# Patient Record
Sex: Female | Born: 1955
Health system: Southern US, Academic
[De-identification: ages and names within clinical notes are randomized; demographics above are authoritative.]

## PROBLEM LIST (undated history)

## (undated) ENCOUNTER — Encounter

## (undated) ENCOUNTER — Telehealth

## (undated) DIAGNOSIS — C50911 Malignant neoplasm of unspecified site of right female breast: Secondary | ICD-10-CM

## (undated) HISTORY — DX: Malignant neoplasm of unspecified site of right female breast: C50.911

---

## 1999-07-18 ENCOUNTER — Encounter: Admission: RE | Admit: 1999-07-18 | Discharge: 1999-07-18 | Payer: Self-pay | Admitting: Family Medicine

## 1999-07-18 ENCOUNTER — Encounter: Payer: Self-pay | Admitting: Family Medicine

## 1999-07-27 ENCOUNTER — Other Ambulatory Visit: Admission: RE | Admit: 1999-07-27 | Discharge: 1999-07-27 | Payer: Self-pay | Admitting: Obstetrics & Gynecology

## 2000-05-08 ENCOUNTER — Encounter (INDEPENDENT_AMBULATORY_CARE_PROVIDER_SITE_OTHER): Payer: Self-pay | Admitting: Specialist

## 2000-05-08 ENCOUNTER — Ambulatory Visit (HOSPITAL_COMMUNITY): Admission: RE | Admit: 2000-05-08 | Discharge: 2000-05-08 | Payer: Self-pay | Admitting: *Deleted

## 2000-09-03 ENCOUNTER — Other Ambulatory Visit: Admission: RE | Admit: 2000-09-03 | Discharge: 2000-09-03 | Payer: Self-pay | Admitting: *Deleted

## 2000-12-03 ENCOUNTER — Encounter: Payer: Self-pay | Admitting: Family Medicine

## 2000-12-03 ENCOUNTER — Encounter: Admission: RE | Admit: 2000-12-03 | Discharge: 2000-12-03 | Payer: Self-pay | Admitting: Family Medicine

## 2001-06-19 ENCOUNTER — Encounter: Admission: RE | Admit: 2001-06-19 | Discharge: 2001-06-19 | Payer: Self-pay | Admitting: Family Medicine

## 2001-06-19 ENCOUNTER — Encounter: Payer: Self-pay | Admitting: Family Medicine

## 2001-09-08 ENCOUNTER — Other Ambulatory Visit: Admission: RE | Admit: 2001-09-08 | Discharge: 2001-09-08 | Payer: Self-pay | Admitting: *Deleted

## 2002-05-26 ENCOUNTER — Other Ambulatory Visit: Admission: RE | Admit: 2002-05-26 | Discharge: 2002-05-26 | Payer: Self-pay | Admitting: *Deleted

## 2002-06-30 ENCOUNTER — Encounter (INDEPENDENT_AMBULATORY_CARE_PROVIDER_SITE_OTHER): Payer: Self-pay

## 2002-06-30 ENCOUNTER — Observation Stay (HOSPITAL_COMMUNITY): Admission: RE | Admit: 2002-06-30 | Discharge: 2002-07-01 | Payer: Self-pay | Admitting: Obstetrics and Gynecology

## 2003-06-04 ENCOUNTER — Encounter: Payer: Self-pay | Admitting: Obstetrics and Gynecology

## 2003-06-04 ENCOUNTER — Encounter: Admission: RE | Admit: 2003-06-04 | Discharge: 2003-06-04 | Payer: Self-pay | Admitting: Obstetrics and Gynecology

## 2004-07-17 ENCOUNTER — Encounter: Admission: RE | Admit: 2004-07-17 | Discharge: 2004-07-17 | Payer: Self-pay | Admitting: Family Medicine

## 2005-02-07 ENCOUNTER — Encounter: Admission: RE | Admit: 2005-02-07 | Discharge: 2005-02-07 | Payer: Self-pay | Admitting: *Deleted

## 2005-05-31 ENCOUNTER — Encounter: Admission: RE | Admit: 2005-05-31 | Discharge: 2005-05-31 | Payer: Self-pay | Admitting: Orthopaedic Surgery

## 2005-06-07 ENCOUNTER — Encounter: Admission: RE | Admit: 2005-06-07 | Discharge: 2005-06-07 | Payer: Self-pay | Admitting: *Deleted

## 2005-06-11 ENCOUNTER — Encounter: Admission: RE | Admit: 2005-06-11 | Discharge: 2005-06-11 | Payer: Self-pay | Admitting: Orthopaedic Surgery

## 2006-05-09 ENCOUNTER — Encounter: Admission: RE | Admit: 2006-05-09 | Discharge: 2006-05-09 | Payer: Self-pay | Admitting: Orthopaedic Surgery

## 2006-06-18 ENCOUNTER — Encounter: Admission: RE | Admit: 2006-06-18 | Discharge: 2006-06-18 | Payer: Self-pay | Admitting: Family Medicine

## 2006-06-21 ENCOUNTER — Encounter: Admission: RE | Admit: 2006-06-21 | Discharge: 2006-06-21 | Payer: Self-pay | Admitting: Orthopaedic Surgery

## 2006-10-08 ENCOUNTER — Encounter: Admission: RE | Admit: 2006-10-08 | Discharge: 2006-10-08 | Payer: Self-pay | Admitting: Obstetrics and Gynecology

## 2007-03-19 ENCOUNTER — Encounter: Admission: RE | Admit: 2007-03-19 | Discharge: 2007-03-19 | Payer: Self-pay | Admitting: Orthopaedic Surgery

## 2007-07-01 ENCOUNTER — Encounter: Admission: RE | Admit: 2007-07-01 | Discharge: 2007-07-01 | Payer: Self-pay | Admitting: Obstetrics and Gynecology

## 2007-10-14 ENCOUNTER — Encounter: Admission: RE | Admit: 2007-10-14 | Discharge: 2007-10-14 | Payer: Self-pay | Admitting: Orthopaedic Surgery

## 2008-03-03 ENCOUNTER — Encounter: Admission: RE | Admit: 2008-03-03 | Discharge: 2008-03-03 | Payer: Self-pay | Admitting: Orthopaedic Surgery

## 2008-06-22 ENCOUNTER — Other Ambulatory Visit: Admission: RE | Admit: 2008-06-22 | Discharge: 2008-06-22 | Payer: Self-pay | Admitting: Obstetrics and Gynecology

## 2008-08-17 ENCOUNTER — Encounter: Admission: RE | Admit: 2008-08-17 | Discharge: 2008-08-17 | Payer: Self-pay | Admitting: Obstetrics and Gynecology

## 2008-08-31 ENCOUNTER — Encounter: Admission: RE | Admit: 2008-08-31 | Discharge: 2008-08-31 | Payer: Self-pay | Admitting: Obstetrics and Gynecology

## 2008-08-31 ENCOUNTER — Encounter (INDEPENDENT_AMBULATORY_CARE_PROVIDER_SITE_OTHER): Payer: Self-pay | Admitting: Diagnostic Radiology

## 2008-09-09 ENCOUNTER — Encounter: Admission: RE | Admit: 2008-09-09 | Discharge: 2008-09-09 | Payer: Self-pay | Admitting: Obstetrics and Gynecology

## 2008-09-13 ENCOUNTER — Encounter: Admission: RE | Admit: 2008-09-13 | Discharge: 2008-09-13 | Payer: Self-pay | Admitting: Obstetrics and Gynecology

## 2008-09-16 ENCOUNTER — Encounter (INDEPENDENT_AMBULATORY_CARE_PROVIDER_SITE_OTHER): Payer: Self-pay | Admitting: Diagnostic Radiology

## 2008-09-16 ENCOUNTER — Encounter: Admission: RE | Admit: 2008-09-16 | Discharge: 2008-09-16 | Payer: Self-pay | Admitting: Obstetrics and Gynecology

## 2008-10-08 ENCOUNTER — Encounter: Admission: RE | Admit: 2008-10-08 | Discharge: 2008-10-08 | Payer: Self-pay | Admitting: Obstetrics and Gynecology

## 2008-10-08 ENCOUNTER — Ambulatory Visit (HOSPITAL_COMMUNITY): Admission: RE | Admit: 2008-10-08 | Discharge: 2008-10-08 | Payer: Self-pay | Admitting: Surgery

## 2008-10-08 ENCOUNTER — Encounter (INDEPENDENT_AMBULATORY_CARE_PROVIDER_SITE_OTHER): Payer: Self-pay | Admitting: Surgery

## 2008-10-18 ENCOUNTER — Ambulatory Visit: Admission: RE | Admit: 2008-10-18 | Discharge: 2008-11-25 | Payer: Self-pay | Admitting: Radiation Oncology

## 2008-10-20 ENCOUNTER — Ambulatory Visit: Payer: Self-pay | Admitting: Oncology

## 2008-11-11 ENCOUNTER — Ambulatory Visit (HOSPITAL_COMMUNITY): Admission: RE | Admit: 2008-11-11 | Discharge: 2008-11-12 | Payer: Self-pay | Admitting: Surgery

## 2008-11-11 ENCOUNTER — Encounter (INDEPENDENT_AMBULATORY_CARE_PROVIDER_SITE_OTHER): Payer: Self-pay | Admitting: Surgery

## 2008-11-24 LAB — CBC WITH DIFFERENTIAL/PLATELET
BASO%: 0.4 % (ref 0.0–2.0)
EOS%: 9.5 % — ABNORMAL HIGH (ref 0.0–7.0)
HGB: 14.1 g/dL (ref 11.6–15.9)
MCH: 32.8 pg (ref 25.1–34.0)
MCHC: 34.3 g/dL (ref 31.5–36.0)
RDW: 12.5 % (ref 11.2–14.5)
WBC: 9.8 10*3/uL (ref 3.9–10.3)
lymph#: 2.9 10*3/uL (ref 0.9–3.3)

## 2008-11-25 ENCOUNTER — Ambulatory Visit: Payer: Self-pay

## 2008-11-25 ENCOUNTER — Encounter: Payer: Self-pay | Admitting: Oncology

## 2008-11-25 LAB — COMPREHENSIVE METABOLIC PANEL
ALT: 81 U/L — ABNORMAL HIGH (ref 0–35)
AST: 55 U/L — ABNORMAL HIGH (ref 0–37)
Albumin: 4.4 g/dL (ref 3.5–5.2)
Calcium: 9.7 mg/dL (ref 8.4–10.5)
Chloride: 100 mEq/L (ref 96–112)
Potassium: 4.2 mEq/L (ref 3.5–5.3)
Total Protein: 6.9 g/dL (ref 6.0–8.3)

## 2008-12-07 ENCOUNTER — Encounter: Admission: RE | Admit: 2008-12-07 | Discharge: 2008-12-07 | Payer: Self-pay | Admitting: Orthopedic Surgery

## 2008-12-20 ENCOUNTER — Ambulatory Visit: Payer: Self-pay | Admitting: Oncology

## 2008-12-22 LAB — CBC WITH DIFFERENTIAL/PLATELET
BASO%: 0.5 % (ref 0.0–2.0)
LYMPH%: 42.5 % (ref 14.0–49.7)
MCHC: 34.7 g/dL (ref 31.5–36.0)
MONO#: 0.6 10*3/uL (ref 0.1–0.9)
Platelets: 169 10*3/uL (ref 145–400)
RBC: 4.3 10*6/uL (ref 3.70–5.45)
RDW: 13.2 % (ref 11.2–14.5)
WBC: 9.1 10*3/uL (ref 3.9–10.3)
nRBC: 0 % (ref 0–0)

## 2009-01-12 LAB — CBC WITH DIFFERENTIAL/PLATELET
BASO%: 0.7 % (ref 0.0–2.0)
EOS%: 1.9 % (ref 0.0–7.0)
Eosinophils Absolute: 0.1 10*3/uL (ref 0.0–0.5)
HCT: 36.9 % (ref 34.8–46.6)
HGB: 13.2 g/dL (ref 11.6–15.9)
LYMPH%: 40.1 % (ref 14.0–49.7)
MCHC: 35.8 g/dL (ref 31.5–36.0)
MONO%: 8.5 % (ref 0.0–14.0)
NEUT#: 3.6 10*3/uL (ref 1.5–6.5)
RBC: 3.99 10*6/uL (ref 3.70–5.45)
RDW: 13.6 % (ref 11.2–14.5)
WBC: 7.4 10*3/uL (ref 3.9–10.3)

## 2009-02-21 ENCOUNTER — Encounter: Payer: Self-pay | Admitting: Cardiology

## 2009-02-21 ENCOUNTER — Ambulatory Visit: Payer: Self-pay

## 2009-02-21 ENCOUNTER — Ambulatory Visit: Payer: Self-pay | Admitting: Oncology

## 2009-02-21 ENCOUNTER — Encounter: Payer: Self-pay | Admitting: Oncology

## 2009-02-23 LAB — CBC WITH DIFFERENTIAL/PLATELET
BASO%: 1.3 % (ref 0.0–2.0)
HCT: 35.9 % (ref 34.8–46.6)
MCHC: 34.5 g/dL (ref 31.5–36.0)
MONO#: 0.5 10*3/uL (ref 0.1–0.9)
RBC: 3.79 10*6/uL (ref 3.70–5.45)
RDW: 12.2 % (ref 11.2–14.5)
WBC: 7.1 10*3/uL (ref 3.9–10.3)
lymph#: 2.7 10*3/uL (ref 0.9–3.3)

## 2009-03-16 LAB — CBC WITH DIFFERENTIAL/PLATELET
BASO%: 0.6 % (ref 0.0–2.0)
LYMPH%: 38.8 % (ref 14.0–49.7)
MCHC: 35.3 g/dL (ref 31.5–36.0)
MONO#: 0.7 10*3/uL (ref 0.1–0.9)
MONO%: 9.2 % (ref 0.0–14.0)
Platelets: 160 10*3/uL (ref 145–400)
RBC: 3.53 10*6/uL — ABNORMAL LOW (ref 3.70–5.45)
WBC: 7.2 10*3/uL (ref 3.9–10.3)

## 2009-04-06 ENCOUNTER — Ambulatory Visit: Payer: Self-pay | Admitting: Oncology

## 2009-04-08 LAB — CBC WITH DIFFERENTIAL/PLATELET
BASO%: 0.5 % (ref 0.0–2.0)
EOS%: 0.9 % (ref 0.0–7.0)
MCH: 32.2 pg (ref 25.1–34.0)
MCHC: 34.7 g/dL (ref 31.5–36.0)
RDW: 11.6 % (ref 11.2–14.5)
lymph#: 3.7 10*3/uL — ABNORMAL HIGH (ref 0.9–3.3)

## 2009-04-08 LAB — TSH: TSH: 5.158 u[IU]/mL — ABNORMAL HIGH (ref 0.350–4.500)

## 2009-05-03 ENCOUNTER — Encounter: Admission: RE | Admit: 2009-05-03 | Discharge: 2009-05-03 | Payer: Self-pay | Admitting: Orthopedic Surgery

## 2009-05-23 ENCOUNTER — Ambulatory Visit: Payer: Self-pay | Admitting: Oncology

## 2009-05-24 ENCOUNTER — Ambulatory Visit: Payer: Self-pay

## 2009-05-24 ENCOUNTER — Encounter: Payer: Self-pay | Admitting: Oncology

## 2009-05-25 LAB — CBC WITH DIFFERENTIAL/PLATELET
BASO%: 0.4 % (ref 0.0–2.0)
HCT: 39.3 % (ref 34.8–46.6)
MCHC: 34.9 g/dL (ref 31.5–36.0)
MONO#: 0.5 10*3/uL (ref 0.1–0.9)
NEUT%: 53 % (ref 38.4–76.8)
WBC: 6.7 10*3/uL (ref 3.9–10.3)
lymph#: 2.5 10*3/uL (ref 0.9–3.3)

## 2009-05-25 LAB — COMPREHENSIVE METABOLIC PANEL
ALT: 24 U/L (ref 0–35)
Albumin: 3.9 g/dL (ref 3.5–5.2)
CO2: 31 mEq/L (ref 19–32)
Calcium: 9.3 mg/dL (ref 8.4–10.5)
Chloride: 107 mEq/L (ref 96–112)
Sodium: 141 mEq/L (ref 135–145)
Total Protein: 6.3 g/dL (ref 6.0–8.3)

## 2009-05-25 LAB — TSH: TSH: 2.672 u[IU]/mL (ref 0.350–4.500)

## 2009-06-07 IMAGING — CR DG CHEST 2V
2 series · 2 of 2 positions shown · non-contrast
Comparison: None

CLINICAL DATA: Breast cancer, pre admission, former smoker

CHEST - 2 VIEW

[view not recorded (1 of 2)]
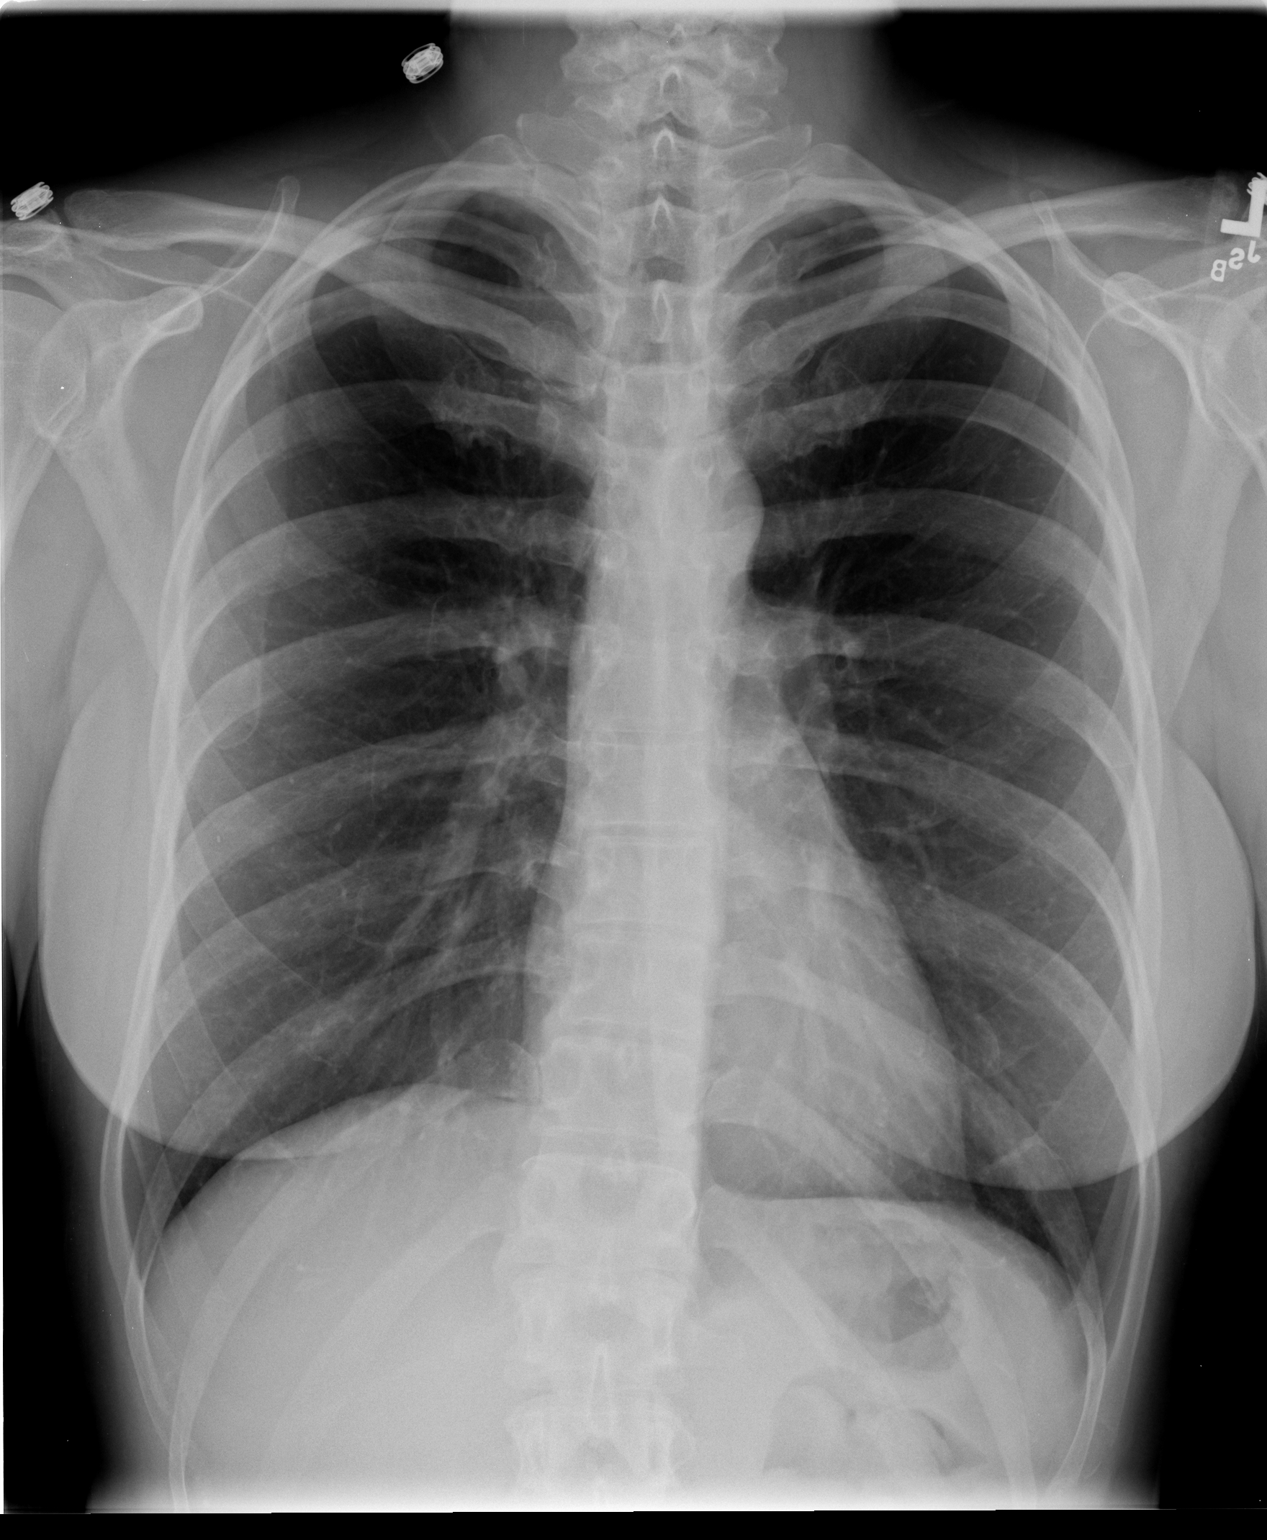

[view not recorded (2 of 2)]
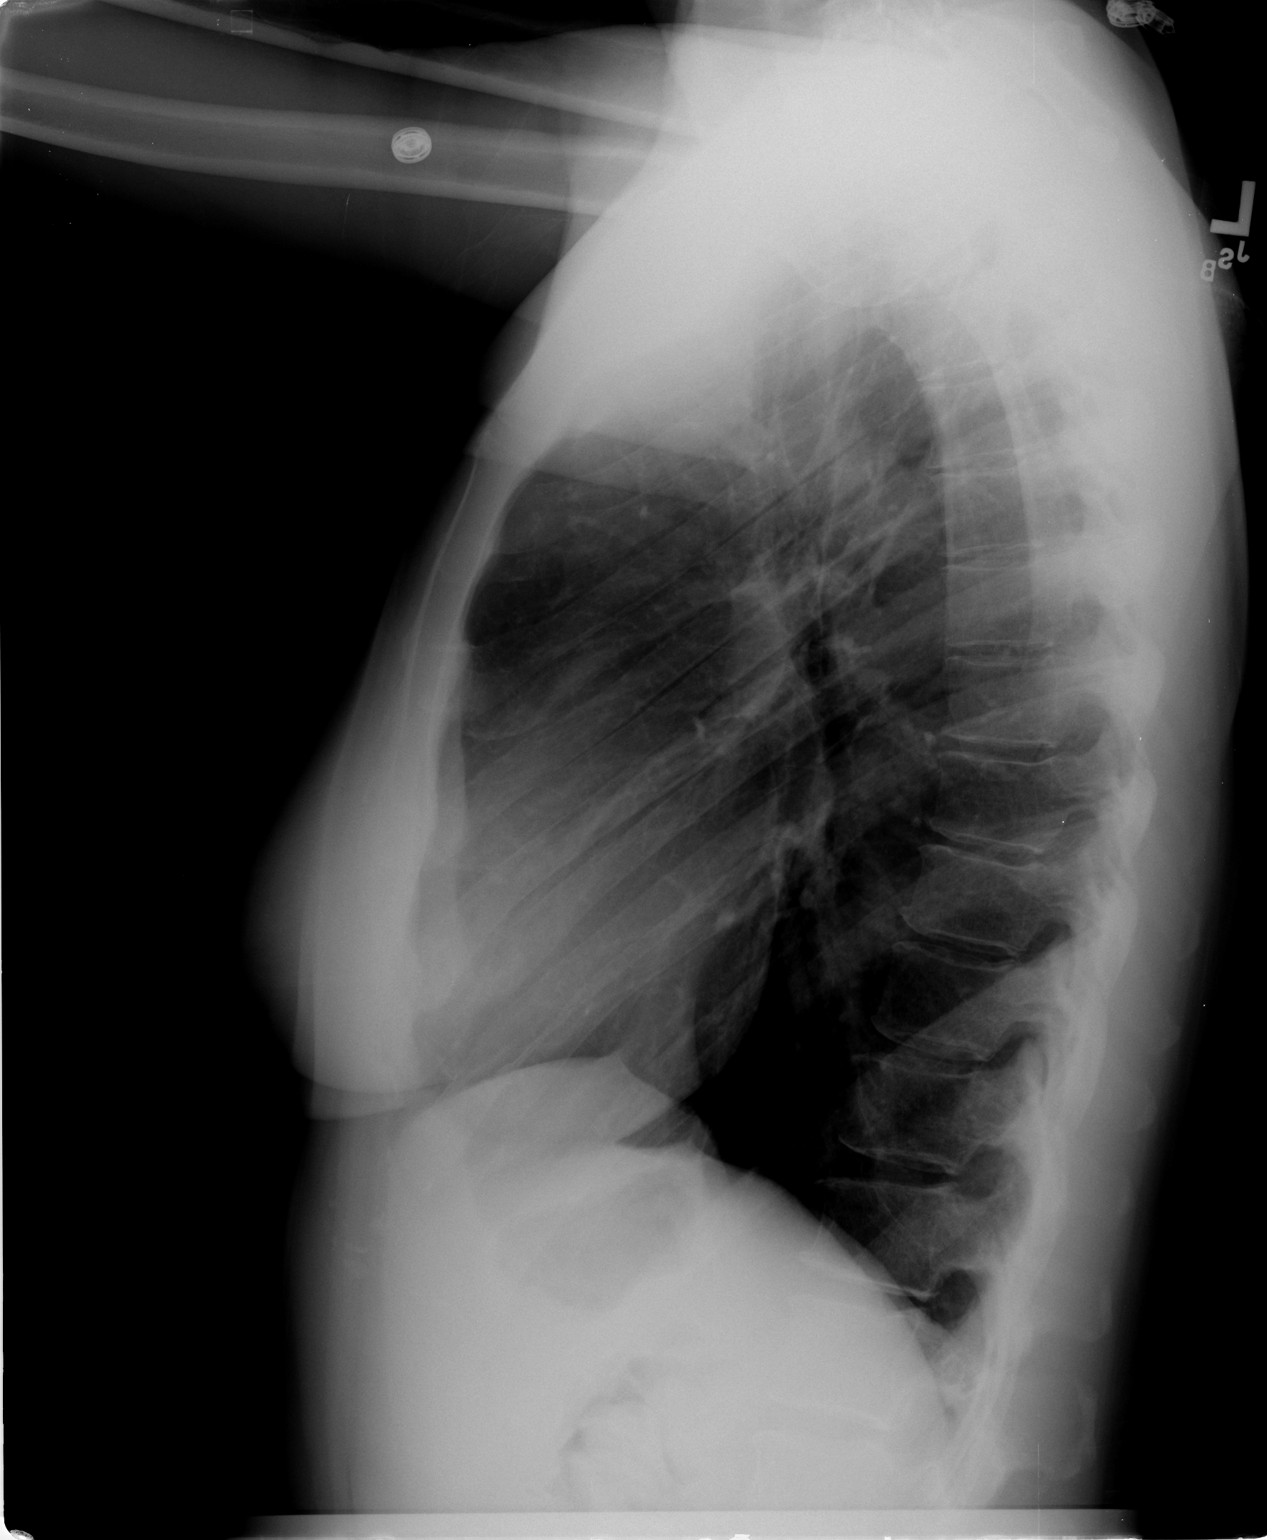

[2 of 2 positions shown; findings below may reference images not displayed]

FINDINGS: Normal heart size, mediastinal contours, and pulmonary vascularity.
Mild peribronchial thickening.
No pulmonary infiltrate, pleural effusion, or pneumothorax.
Bones unremarkable.
No pulmonary mass or nodule.
IMPRESSION: Mild bronchitic changes.

## 2009-06-21 ENCOUNTER — Encounter: Admission: RE | Admit: 2009-06-21 | Discharge: 2009-06-21 | Payer: Self-pay | Admitting: Orthopedic Surgery

## 2009-06-21 LAB — CBC WITH DIFFERENTIAL/PLATELET
BASO%: 0.3 % (ref 0.0–2.0)
Basophils Absolute: 0 10*3/uL (ref 0.0–0.1)
EOS%: 0.2 % (ref 0.0–7.0)
HCT: 36 % (ref 34.8–46.6)
HGB: 12.5 g/dL (ref 11.6–15.9)
MCH: 32.1 pg (ref 25.1–34.0)
MONO#: 0.1 10*3/uL (ref 0.1–0.9)
NEUT%: 80 % — ABNORMAL HIGH (ref 38.4–76.8)
RDW: 12.4 % (ref 11.2–14.5)
WBC: 6.3 10*3/uL (ref 3.9–10.3)
lymph#: 1.1 10*3/uL (ref 0.9–3.3)

## 2009-06-21 LAB — FOLLICLE STIMULATING HORMONE: FSH: 47 m[IU]/mL

## 2009-06-29 LAB — ESTRADIOL, ULTRA SENS

## 2009-07-22 ENCOUNTER — Ambulatory Visit: Payer: Self-pay | Admitting: Oncology

## 2009-07-27 LAB — CBC WITH DIFFERENTIAL/PLATELET
BASO%: 0.3 % (ref 0.0–2.0)
EOS%: 1.6 % (ref 0.0–7.0)
MCH: 32.4 pg (ref 25.1–34.0)
MCHC: 34.2 g/dL (ref 31.5–36.0)
MONO#: 0.7 10*3/uL (ref 0.1–0.9)
RBC: 3.86 10*6/uL (ref 3.70–5.45)
RDW: 12.6 % (ref 11.2–14.5)
WBC: 9.4 10*3/uL (ref 3.9–10.3)
lymph#: 3.5 10*3/uL — ABNORMAL HIGH (ref 0.9–3.3)

## 2009-08-16 ENCOUNTER — Ambulatory Visit (HOSPITAL_COMMUNITY): Admission: RE | Admit: 2009-08-16 | Discharge: 2009-08-16 | Payer: Self-pay | Admitting: Oncology

## 2009-08-16 ENCOUNTER — Ambulatory Visit: Payer: Self-pay

## 2009-08-16 ENCOUNTER — Ambulatory Visit: Payer: Self-pay | Admitting: Cardiology

## 2009-08-16 ENCOUNTER — Encounter: Payer: Self-pay | Admitting: Oncology

## 2009-08-17 ENCOUNTER — Ambulatory Visit: Payer: Self-pay | Admitting: Oncology

## 2009-08-17 LAB — CBC WITH DIFFERENTIAL/PLATELET
BASO%: 0.9 % (ref 0.0–2.0)
EOS%: 3.3 % (ref 0.0–7.0)
LYMPH%: 40.7 % (ref 14.0–49.7)
MCH: 32.1 pg (ref 25.1–34.0)
MCHC: 34.4 g/dL (ref 31.5–36.0)
MCV: 93.3 fL (ref 79.5–101.0)
MONO#: 0.5 10*3/uL (ref 0.1–0.9)
MONO%: 7.2 % (ref 0.0–14.0)
NEUT%: 47.9 % (ref 38.4–76.8)
Platelets: 200 10*3/uL (ref 145–400)
RBC: 4.18 10*6/uL (ref 3.70–5.45)
WBC: 6.6 10*3/uL (ref 3.9–10.3)

## 2009-08-18 ENCOUNTER — Encounter: Admission: RE | Admit: 2009-08-18 | Discharge: 2009-08-18 | Payer: Self-pay | Admitting: Oncology

## 2009-08-29 ENCOUNTER — Ambulatory Visit (HOSPITAL_COMMUNITY): Admission: RE | Admit: 2009-08-29 | Discharge: 2009-08-29 | Payer: Self-pay | Admitting: Oncology

## 2009-09-12 LAB — COMPREHENSIVE METABOLIC PANEL
Alkaline Phosphatase: 64 U/L (ref 39–117)
BUN: 21 mg/dL (ref 6–23)
Creatinine, Ser: 0.61 mg/dL (ref 0.40–1.20)
Glucose, Bld: 77 mg/dL (ref 70–99)
Total Bilirubin: 0.7 mg/dL (ref 0.3–1.2)

## 2009-09-12 LAB — CBC WITH DIFFERENTIAL/PLATELET
Basophils Absolute: 0.1 10*3/uL (ref 0.0–0.1)
Eosinophils Absolute: 0.2 10*3/uL (ref 0.0–0.5)
HGB: 13.6 g/dL (ref 11.6–15.9)
LYMPH%: 41.9 % (ref 14.0–49.7)
MCV: 92.4 fL (ref 79.5–101.0)
MONO%: 8.6 % (ref 0.0–14.0)
NEUT#: 2.8 10*3/uL (ref 1.5–6.5)
Platelets: 178 10*3/uL (ref 145–400)

## 2009-09-29 ENCOUNTER — Ambulatory Visit: Payer: Self-pay | Admitting: Oncology

## 2009-10-05 LAB — CBC WITH DIFFERENTIAL/PLATELET
BASO%: 0.4 % (ref 0.0–2.0)
HCT: 37.7 % (ref 34.8–46.6)
HGB: 13.2 g/dL (ref 11.6–15.9)
MCHC: 35.1 g/dL (ref 31.5–36.0)
MONO#: 0.6 10*3/uL (ref 0.1–0.9)
NEUT%: 56.9 % (ref 38.4–76.8)
RDW: 12.7 % (ref 11.2–14.5)
WBC: 7.4 10*3/uL (ref 3.9–10.3)
lymph#: 2.5 10*3/uL (ref 0.9–3.3)

## 2009-10-21 LAB — CBC WITH DIFFERENTIAL/PLATELET
Basophils Absolute: 0 10*3/uL (ref 0.0–0.1)
Eosinophils Absolute: 0.1 10*3/uL (ref 0.0–0.5)
HCT: 39.9 % (ref 34.8–46.6)
HGB: 13.9 g/dL (ref 11.6–15.9)
MCH: 33.3 pg (ref 25.1–34.0)
MONO#: 0.5 10*3/uL (ref 0.1–0.9)
NEUT#: 3.4 10*3/uL (ref 1.5–6.5)
NEUT%: 51.1 % (ref 38.4–76.8)
lymph#: 2.6 10*3/uL (ref 0.9–3.3)

## 2009-11-16 ENCOUNTER — Ambulatory Visit: Payer: Self-pay | Admitting: Oncology

## 2009-11-18 LAB — CBC WITH DIFFERENTIAL/PLATELET
BASO%: 0.4 % (ref 0.0–2.0)
EOS%: 0.8 % (ref 0.0–7.0)
HCT: 39 % (ref 34.8–46.6)
LYMPH%: 37.9 % (ref 14.0–49.7)
MCH: 32 pg (ref 25.1–34.0)
MCHC: 34.9 g/dL (ref 31.5–36.0)
NEUT%: 53 % (ref 38.4–76.8)
RBC: 4.25 10*6/uL (ref 3.70–5.45)
lymph#: 2.8 10*3/uL (ref 0.9–3.3)
nRBC: 0 % (ref 0–0)

## 2009-12-07 LAB — CBC WITH DIFFERENTIAL/PLATELET
BASO%: 0.4 % (ref 0.0–2.0)
HCT: 38.8 % (ref 34.8–46.6)
LYMPH%: 34.3 % (ref 14.0–49.7)
MCH: 33.2 pg (ref 25.1–34.0)
MCHC: 34.8 g/dL (ref 31.5–36.0)
MCV: 95.4 fL (ref 79.5–101.0)
MONO#: 0.7 10*3/uL (ref 0.1–0.9)
MONO%: 10.3 % (ref 0.0–14.0)
NEUT%: 52.1 % (ref 38.4–76.8)
Platelets: 182 10*3/uL (ref 145–400)
RBC: 4.07 10*6/uL (ref 3.70–5.45)

## 2010-01-02 ENCOUNTER — Ambulatory Visit: Payer: Self-pay | Admitting: Oncology

## 2010-01-04 ENCOUNTER — Ambulatory Visit: Payer: Self-pay | Admitting: Cardiology

## 2010-01-04 ENCOUNTER — Ambulatory Visit: Admission: RE | Admit: 2010-01-04 | Discharge: 2010-01-04 | Payer: Self-pay | Admitting: Oncology

## 2010-01-04 ENCOUNTER — Encounter: Payer: Self-pay | Admitting: Oncology

## 2010-01-04 LAB — COMPREHENSIVE METABOLIC PANEL
ALT: 24 U/L (ref 0–35)
AST: 19 U/L (ref 0–37)
Albumin: 4.7 g/dL (ref 3.5–5.2)
CO2: 22 mEq/L (ref 19–32)
Calcium: 9.4 mg/dL (ref 8.4–10.5)
Chloride: 105 mEq/L (ref 96–112)
Potassium: 4.4 mEq/L (ref 3.5–5.3)
Total Protein: 6.8 g/dL (ref 6.0–8.3)

## 2010-01-04 LAB — CBC WITH DIFFERENTIAL/PLATELET
BASO%: 0.5 % (ref 0.0–2.0)
EOS%: 2.3 % (ref 0.0–7.0)
HCT: 41.7 % (ref 34.8–46.6)
HGB: 14.6 g/dL (ref 11.6–15.9)
MCH: 33.6 pg (ref 25.1–34.0)
MCHC: 35 g/dL (ref 31.5–36.0)
MONO#: 0.8 10*3/uL (ref 0.1–0.9)
NEUT%: 54.2 % (ref 38.4–76.8)
RDW: 12.6 % (ref 11.2–14.5)
WBC: 9.3 10*3/uL (ref 3.9–10.3)
lymph#: 3.2 10*3/uL (ref 0.9–3.3)

## 2010-01-04 LAB — FOLLICLE STIMULATING HORMONE: FSH: 11.7 m[IU]/mL

## 2010-04-14 ENCOUNTER — Ambulatory Visit: Payer: Self-pay | Admitting: Oncology

## 2010-07-04 ENCOUNTER — Ambulatory Visit: Payer: Self-pay | Admitting: Oncology

## 2010-07-13 LAB — CBC WITH DIFFERENTIAL/PLATELET
Basophils Absolute: 0 10*3/uL (ref 0.0–0.1)
EOS%: 1.2 % (ref 0.0–7.0)
Eosinophils Absolute: 0.1 10*3/uL (ref 0.0–0.5)
HCT: 38.7 % (ref 34.8–46.6)
HGB: 13.6 g/dL (ref 11.6–15.9)
MCH: 33.8 pg (ref 25.1–34.0)
MCV: 96.2 fL (ref 79.5–101.0)
MONO%: 7.2 % (ref 0.0–14.0)
NEUT#: 4.2 10*3/uL (ref 1.5–6.5)
NEUT%: 54.7 % (ref 38.4–76.8)
Platelets: 181 10*3/uL (ref 145–400)

## 2010-07-13 LAB — COMPREHENSIVE METABOLIC PANEL
AST: 18 U/L (ref 0–37)
Albumin: 4.6 g/dL (ref 3.5–5.2)
Alkaline Phosphatase: 51 U/L (ref 39–117)
BUN: 16 mg/dL (ref 6–23)
Calcium: 9.5 mg/dL (ref 8.4–10.5)
Creatinine, Ser: 1.06 mg/dL (ref 0.40–1.20)
Glucose, Bld: 82 mg/dL (ref 70–99)

## 2010-07-20 LAB — ESTRADIOL, ULTRA SENS: Estradiol, Ultra Sensitive: 7 pg/mL

## 2010-09-08 ENCOUNTER — Encounter
Admission: RE | Admit: 2010-09-08 | Discharge: 2010-09-08 | Payer: Self-pay | Source: Home / Self Care | Attending: Oncology | Admitting: Oncology

## 2010-10-22 ENCOUNTER — Encounter: Payer: Self-pay | Admitting: Orthopaedic Surgery

## 2010-10-22 ENCOUNTER — Encounter: Payer: Self-pay | Admitting: Obstetrics and Gynecology

## 2011-01-15 LAB — DIFFERENTIAL
Basophils Relative: 1 % (ref 0–1)
Eosinophils Absolute: 0.1 10*3/uL (ref 0.0–0.7)
Neutro Abs: 3.7 10*3/uL (ref 1.7–7.7)
Neutrophils Relative %: 49 % (ref 43–77)

## 2011-01-15 LAB — CEA: CEA: 1.9 ng/mL (ref 0.0–5.0)

## 2011-01-15 LAB — LACTATE DEHYDROGENASE: LDH: 117 U/L (ref 94–250)

## 2011-01-15 LAB — CBC
MCHC: 33.7 g/dL (ref 30.0–36.0)
RBC: 4.09 MIL/uL (ref 3.87–5.11)
RDW: 12.1 % (ref 11.5–15.5)

## 2011-01-16 LAB — COMPREHENSIVE METABOLIC PANEL
ALT: 18 U/L (ref 0–35)
AST: 22 U/L (ref 0–37)
Albumin: 3.8 g/dL (ref 3.5–5.2)
Alkaline Phosphatase: 80 U/L (ref 39–117)
Chloride: 105 mEq/L (ref 96–112)
GFR calc Af Amer: >60 mL/min (ref >60)
Potassium: 4.3 mEq/L (ref 3.5–5.1)
Sodium: 140 mEq/L (ref 135–145)
Total Bilirubin: 0.8 mg/dL (ref 0.3–1.2)

## 2011-01-16 LAB — DIFFERENTIAL
Basophils Absolute: 0 10*3/uL (ref 0.0–0.1)
Eosinophils Relative: 7 % — ABNORMAL HIGH (ref 0–5)
Lymphocytes Relative: 39 % (ref 12–46)
Monocytes Absolute: 0.5 10*3/uL (ref 0.1–1.0)

## 2011-01-16 LAB — CBC
Platelets: 183 10*3/uL (ref 150–400)
WBC: 7.3 10*3/uL (ref 4.0–10.5)

## 2011-02-13 NOTE — Op Note (Signed)
NAME:  Gina Owens, Gina Owens           ACCOUNT NO.:  [id number]   MEDICAL RECORD NO.:  [id number]          PATIENT TYPE:  AMB   LOCATION:  SDS                          FACILITY:  [facility]   PHYSICIAN:  Sandria Bales. Ezzard Standing, M.D.  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   PROCEDURE:  Needle local right breast excision with lumpectomy.   SURGEON:  Sandria Bales. Ezzard Standing, MD   FIRST ASSISTANT:  None.   ANESTHESIA:  General endotracheal with about 30 mL of 0.25% Marcaine.   COMPLICATIONS:  None.   INDICATIONS FOR PROCEDURE:  [name] is a 55 year old white female  patient of Dr. Lupe Carney who sees Dr. Aram Beecham Romine from a GYN  standpoint was found on mammogram to have a cluster of  microcalcifications in the upper quadrant of her right breast.  Core  biopsies revealed a high-grade ductal carcinoma in situ with a focus of  necrosis.  Her MRI suggests a possible wider area of involvement of her  right breast but after lengthy discussions, it was thought best to first  do an excisional biopsy of this area and get a hand on how widespread  her disease is.   The indications and complications of the procedure were explained to the  patient.  Potential complications include, but not limited to, bleeding,  infection, nerve injury and the possibility of requiring more surgery if  she widespread disease.   OPERATIVE NOTE:  The patient presented to the operating room.  I marked  the right breast.  She had a wire coming from the right breast about the  12 o' clock position.  The right breast was prepped with chlorhexidine  and sterilely draped.  A time-out was held identifying the patient and  the procedure.   I then made  an approximate 4 cm incision in the upper outer quadrant of  the right breast.  I excised a block of breast tissue about 3-4 cm in  diameter all the way down to the chest wall.  I then marked the specimen  with a paint kit for 6 point orientation and the wire came out  anteriorly and superiorly of the specimen.   Specimen mammogram confirmed the microcalcifications and the clip were  in the specimen.   I then infiltrated the skin and breast with 30 mL of 0.25% Marcaine.  I  closed the subcutaneous tissues with 3-0 Vicryl suture, the skin with a  5-0 Vicryl suture, painted the wound with tincture of benzoin and Steri-  Strips.  The patient tolerated the procedure well, was transported to  recovery room in good condition.   Sponge and needle count were correct at the end of the case.  She will  be in touch with me in about 5 days for final pathology report.      Sandria Bales. Ezzard Standing, M.D.  Electronically Signed     DHN/MEDQ  D:  10/08/2008  T:  10/08/2008  Job:  [id number]   cc:   L. Lupe Carney, M.D.  Cynthia P. Romine, M.D.  Oliver Hum, MD

## 2011-02-13 NOTE — Op Note (Signed)
NAME:  Gina Owens, FLENER NO.:  [id number]   MEDICAL RECORD NO.:  [id number]          PATIENT TYPE:  OIB   LOCATION:  5122                         FACILITY:  [facility]   PHYSICIAN:  Sandria Bales. Ezzard Standing, M.D.  DATE OF BIRTH:  06-Jun-1956   DATE OF PROCEDURE:  DATE OF DISCHARGE:                               OPERATIVE REPORT   Date of surgery ?   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   POSTOPERATIVE DIAGNOSES:  Widespread ductal carcinoma in situ of right  breast (6.2 cm) with microinvasive ductal carcinoma, negative sentinel  lymph node per Dr. Brain Hilts.   PROCEDURE:  1. Injection of methylene blue in right breast.  2. Right axillary sentinel lymph node biopsy.  3. Right simple mastectomy.  4. Immediate reconstruction per Dr. Etter Sjogren with tissue      expanders.   SURGEON:  Sandria Bales. Ezzard Standing, MD   FIRST ASSISTANT:  None.  Dr. Odis Luster will dictate the reconstruction  portion.   ANESTHESIA:  General endotracheal.   ESTIMATED BLOOD LOSS:  100 mL.   DRAINS LEFT IN:  Will be determined by Dr. Odis Luster.   INDICATIONS FOR PROCEDURE:  Ms. Pagett is a 55 year old white female  who sees Dr. Lupe Carney, her primary care doctor, Dr. Meredeth Ide  from a GYN standpoint, has had a right breast lumpectomy on October 08, 2008, and unfortunately, this showed extensive high-grade ductal  carcinoma measuring 6.2 cm with areas of microinvasive ductal carcinoma.  I thought she was not a good candidate for lumpectomy and would be best  served with a mastectomy.  She has spoken to Dr. Etter Sjogren about  immediate reconstruction.  We will proceed with this at this time.   The indications and risks of mastectomy were explained to the patient.  Risks include bleeding, infection, nerve injury, and recurrence of the  tumor.  We will also plan to do a sentinel lymph node biopsy.  She  understands that  the sentinel lymph node biopsy is positive and she will  need a complete axillary node dissection.   OPERATIVE NOTE:  The patient was placed in the supine position.  Her  breasts were scrubbed and then painted with Betadine solution and  sterilely draped.  I had marked her right side before surgery.  A time-  out was held identifying the surgeon, the patient, and the operation.  I  marked the inframammary folds of each breast.   First, I injected her right breast with a 40% methylene blue, I used  about 2 mL.  She had an injection of 1 mCi of sulfa colloid before the  surgery to help identify a sentinel lymph node in her right axilla.  I  made an incision directly over an area of increased counts foudn with  the Neoprobe and cut down to the sentinel lymph node which was blue and  hot.  The sentinel lymph node measured about 600 counts.  The background  was somewhere between 15 and 20.  This was sent to Dr. Brain Hilts who  was  doing touch preps.  She said the touch prep on this was negative.   I then turned my attention to the breast.  I made a standard  circumareolar incision excising the nipple and the skin a little bit on  both sides.  I did not get through her old scar in trying to preserve  her skin for the tissue expander.   I then developed flaps going superiorly 2 fingerbreadths below the  clavicle, inferiorly to the investing fascia of the rectus abdominis  muscle, medial to the lateral edge of the sternum, laterally to the  latissimus dorsi muscle.   I then elevated the breast off the chest wall.  I tried to include my  old biopsy bed and I did take off the fascia and a little bit of muscle  underneath this bed to try to have a complete reexcision of the prior  biopsy site.   I called Dr. Dierdre Searles and instructed that I did want inspection of the bed of  the biopsy site because the margins had previously been so close.  She  also had a small node, so I put a suture on the  lateral margin of the  breast.  I did mark both the breast specimen and node, they was sort of  closed by.   At this point, Dr. Odis Luster scrubbed in to begin the breast  reconstruction.  The skin flaps looked good.  The area between her prior  biopsy site and the skin looked healthy, and Dr. Odis Luster and I discussed  and we thought we would probably leave this in place and we were then  trying to do a wider excision.   He will dictate the remainder of this operation of reconstruction.      Sandria Bales. Ezzard Standing, M.D.  Electronically Signed     DHN/MEDQ  D:  11/11/2008  T:  11/11/2008  Job:  [id number]   cc:   Etter Sjogren, M.D.  Oliver Hum, MD  L. Lupe Carney, M.D.  Valentino Hue. Magrinat, M.D.  Cynthia P. Romine, M.D.  Lurline Hare, MD

## 2011-02-13 NOTE — Op Note (Signed)
NAME:  JAYCEY, GENS NO.:  [id number]   MEDICAL RECORD NO.:  [id number]          PATIENT TYPE:  OIB   LOCATION:  5122                         FACILITY:  [facility]   PHYSICIAN:  Etter Sjogren, M.D.     DATE OF BIRTH:  27-Nov-1955   DATE OF PROCEDURE:  11/11/2008  DATE OF DISCHARGE:                               OPERATIVE REPORT   PREOPERATIVE DIAGNOSIS:  Right breast cancer.   POSTOPERATIVE DIAGNOSIS:  Right breast cancer.   PROCEDURE PERFORMED:  Right breast reconstruction with a tissue  expander.   SURGEON:  Etter Sjogren, MD   ANESTHESIA:  General.   ESTIMATED BLOOD LOSS:  Minimal.   DRAINS:  Two Blakes.   CLINICAL NOTE:  A 55 year old woman who has right breast cancer and  mastectomy was recommended.  She desired reconstruction and options were  discussed with her in length and she selected tissue expander followed  as a planned stage procedure by eventual placement of an implant.  Then  the procedure, the risks, and possible complications were discussed with  her in great detail and she understood those risks to include but were  not limited to bleeding, infection, anesthesia complications, healing  problems, scarring, pneumonia, pneumothorax, pulmonary embolism, fluid  collections, failure of the expander, failure of the implant, capsular  contracture, displacement of the expander, displacement of the implant,  asymmetry, disappointment, and chronic pain.  She wished to proceed.   DESCRIPTION OF PROCEDURE:  The patient presented to the operating room  and mastectomy with sentinel lymph node biopsy had been completed by Dr.  Ovidio Kin.  The submuscular pocket was then developed below the  pectoralis major muscle extending inferiorly to the limits of the  inframammary crease that had been marked preoperatively to the  dimensions of the planned tissue expander.  Irrigation with saline,  meticulous hemostasis with electrocautery, and antibiotic solution  was  also used as an irrigation.  Blake drains were positioned and brought  out through separate stab wounds inferolaterally and secured with 3-0  Prolene sutures.  The expander was soaked in antibiotic solution and  after thoroughly cleaning gloves, it was prepared.  This was a Allergan  implant 133 MV 500 mL implant, lot number [id number].  All the air was  removed, 100 mL sterile saline placed using a closed filling system,  implants soaked in the antibiotic solution, and again careful check  revealed excellent hemostasis.  The expander was positioned.  Care was  taken to be sure that it was positioned at the inferior aspect of the  submuscular pocket and that there were no folds and creases.  The muscle  closure with 3-0 Vicryl interrupted figure-of-eight sutures taking great  care to avoid damage to underlying tissue expander.  The wounds closed  with 3-0 Monocryl interrupted inverted deep dermal sutures and running 3-  0 Monocryl subcuticular suture.  Steri-Strips, dry sterile  dressing, and a circumferential Ace wrap applied and she was transferred  to the recovery room stable having tolerated the procedure well.   DISPOSITION:  She will be observed overnight here at the hospital.  Etter Sjogren, M.D.  Electronically Signed     Etter Sjogren, M.D.  Electronically Signed    DB/MEDQ  D:  11/11/2008  T:  11/12/2008  Job:  [id number]

## 2011-06-25 ENCOUNTER — Other Ambulatory Visit: Payer: Self-pay | Admitting: Orthopedic Surgery

## 2011-06-25 DIAGNOSIS — M549 Dorsalgia, unspecified: Secondary | ICD-10-CM

## 2011-06-29 ENCOUNTER — Ambulatory Visit
Admission: RE | Admit: 2011-06-29 | Discharge: 2011-06-29 | Disposition: A | Discharge status: Home / Self Care | Payer: BC Managed Care – PPO | Source: Ambulatory Visit | Attending: Orthopedic Surgery | Admitting: Orthopedic Surgery

## 2011-06-29 ENCOUNTER — Other Ambulatory Visit: Payer: Self-pay | Admitting: Orthopedic Surgery

## 2011-06-29 DIAGNOSIS — M549 Dorsalgia, unspecified: Secondary | ICD-10-CM

## 2011-06-29 MED ORDER — IOHEXOL 180 MG/ML  SOLN
1.0000 mL | Freq: Once | INTRAMUSCULAR | Status: AC | PRN
  Administered 2011-06-29: 1 mL via EPIDURAL

## 2011-06-29 MED ORDER — METHYLPREDNISOLONE ACETATE 40 MG/ML INJ SUSP (RADIOLOG
120.0000 mg | Freq: Once | INTRAMUSCULAR | Status: AC
  Administered 2011-06-29: 120 mg via EPIDURAL

## 2011-08-10 ENCOUNTER — Other Ambulatory Visit: Payer: Self-pay | Admitting: Obstetrics and Gynecology

## 2011-08-10 DIAGNOSIS — N644 Mastodynia: Secondary | ICD-10-CM

## 2011-08-27 ENCOUNTER — Ambulatory Visit
Admission: RE | Admit: 2011-08-27 | Discharge: 2011-08-27 | Disposition: A | Discharge status: Home / Self Care | Payer: BC Managed Care – PPO | Source: Ambulatory Visit | Attending: Obstetrics and Gynecology | Admitting: Obstetrics and Gynecology

## 2011-08-27 DIAGNOSIS — N644 Mastodynia: Secondary | ICD-10-CM

## 2011-10-01 ENCOUNTER — Encounter: Payer: Self-pay | Admitting: Physician Assistant

## 2011-10-01 ENCOUNTER — Other Ambulatory Visit: Payer: Self-pay | Admitting: Physician Assistant

## 2011-10-01 DIAGNOSIS — C50911 Malignant neoplasm of unspecified site of right female breast: Secondary | ICD-10-CM

## 2011-10-01 HISTORY — DX: Malignant neoplasm of unspecified site of right female breast: C50.911

## 2011-10-09 ENCOUNTER — Ambulatory Visit (HOSPITAL_BASED_OUTPATIENT_CLINIC_OR_DEPARTMENT_OTHER): Payer: BC Managed Care – PPO | Admitting: Physician Assistant

## 2011-10-09 ENCOUNTER — Other Ambulatory Visit: Payer: Self-pay | Admitting: Oncology

## 2011-10-09 ENCOUNTER — Encounter: Payer: Self-pay | Admitting: Physician Assistant

## 2011-10-09 ENCOUNTER — Other Ambulatory Visit: Payer: BC Managed Care – PPO | Admitting: Lab

## 2011-10-09 VITALS — BP 128/79 | HR 67 | Temp 98.2°F | Ht 67.0 in | Wt 167.3 lb

## 2011-10-09 DIAGNOSIS — C50911 Malignant neoplasm of unspecified site of right female breast: Secondary | ICD-10-CM

## 2011-10-09 DIAGNOSIS — Z853 Personal history of malignant neoplasm of breast: Secondary | ICD-10-CM

## 2011-10-09 LAB — COMPREHENSIVE METABOLIC PANEL
Albumin: 4.4 g/dL (ref 3.5–5.2)
BUN: 19 mg/dL (ref 6–23)
Calcium: 9.5 mg/dL (ref 8.4–10.5)
Chloride: 106 mEq/L (ref 96–112)
Creatinine, Ser: 0.79 mg/dL (ref 0.50–1.10)
Glucose, Bld: 90 mg/dL (ref 70–99)
Potassium: 4 mEq/L (ref 3.5–5.3)

## 2011-10-09 LAB — CBC WITH DIFFERENTIAL/PLATELET
Basophils Absolute: 0 10*3/uL (ref 0.0–0.1)
Eosinophils Absolute: 0.1 10*3/uL (ref 0.0–0.5)
HCT: 37.1 % (ref 34.8–46.6)
HGB: 12.9 g/dL (ref 11.6–15.9)
MCV: 96.1 fL (ref 79.5–101.0)
NEUT#: 4.1 10*3/uL (ref 1.5–6.5)
NEUT%: 52.4 % (ref 38.4–76.8)
RDW: 12.4 % (ref 11.2–14.5)
lymph#: 2.9 10*3/uL (ref 0.9–3.3)

## 2011-10-09 NOTE — Progress Notes (Signed)
Hematology and Oncology Follow Up Visit  Gina Owens [id number] Jun 07, 1956 56 y.o. 10/09/2011    HPI: Gina Owens was feeling fine with no symptoms when she went for a screening mammography in November 2009.  This showed an area of calcifications in the upper outer right breast and she was brought back for diagnostic mammography, December 1.  After appropriate discussion, the patient underwent stereotactic biopsy of this suspicious area on the same day and the pathology ([id number]) showed a high-grade ductal carcinoma in situ with evidence of necrosis and calcifications.  The tumor was estrogen and progesterone receptor positive at 51 and 25% respectively.  With this information, the patient was referred to Dr. Ovidio Kin and bilateral breast MRIs were obtained December 10.  This showed an area of enhancement surrounding the clip as expected, but there was a second area approximately 4 cm from the clip that was also suspicious.  Accordingly, the patient underwent a right breast ultrasound on 12/14.  The abnormality could not be located by ultrasound and on 12/17 she underwent MRI-guided biopsy of the second suspicious area and this showed ([id number]) an area of benign breast parenchyma with no atypia or malignancy.  Accordingly, the patient underwent right lumpectomy on October 08, 2008 and the final pathology ([id number] and [id number]) showed extensive high-grade ductal carcinoma in situ with an area of microinvasion measuring less than one mm.  The area of DCIS measured at least 6.2 cm, several margins were positive for the in situ component.  The prognostic panel showed positivity by CISH for the invasive component with a ratio of 7.27.  With all this information, the patient proceeded after appropriate discussion to a right mastectomy with immediate implant reconstruction on November 11, 2008.  The final pathology there ([id number]) showed the single sentinel lymph node to be negative and a second  lymph node in the axillary tail was also negative.  There were multiple foci of residual high-grade ductal carcinoma, but there was no residual invasive carcinoma.  Margins were negative, but the deep margin in particular was 1 mm (at fascia).  The invasive tumor was estrogen receptor positive at 88% and progesterone receptor positive at 52%, as well as of course HER2 positive.  It had a proliferation fraction of 9%.  With this information, the patient met with Dr. Michell Heinrich and Dr. Michell Heinrich thought that radiation would be advisable because of the deep margin, but perhaps could be avoided if the patient received Herceptin.  Accordingly, the patient received one year of Herceptin, completed in March of 2011. An echocardiogram with completion of trastuzumab showed a well preserved ejection fraction of 55-60%.  The patient was also started on tamoxifen, which was been discontinued due to poor tolerance. She's also tried letrozole and exemestane, but was unable to tolerate any of these medications, primarily secondary to severe hot flashes the affected quality of life. She discontinued tamoxifen in July 2011, followed with observation alone. Continues on Megace, 20 mg nightly for control of hot flashes.  Interim History:   Gina Owens returns today for routine one-year followup of her right breast carcinoma. She continues off tamoxifen, and is followed with observation alone. She is feeling well, currently with no physical complaints. She does worry a little bit about whether or not she should try tamoxifen again, and we spent some time discussing this today. Over half of our 30 minute appointment was spent in counseling and coordination of care.  Gina Owens continues to be very busy working, and travels quite a bit. She has  seen several plastic surgeons regarding her right breast reconstruction and has not made a final decision there. She exercises on a regular basis. She is up-to-date with her health  maintenance, including routine physical and screening colonoscopy.  A detailed review of systems is otherwise noncontributory as noted below.  Review of Systems: Constitutional:  no weight loss, fever, night sweats and feels well Eyes: negative BJY:[id number] Cardiovascular: no chest pain or dyspnea on exertion Respiratory: no cough, shortness of breath, or wheezing Neurological: negative Dermatological: negative Gastrointestinal: no abdominal pain, change in bowel habits, or black or bloody stools Genito-Urinary: no dysuria, trouble voiding, or hematuria Hematological and Lymphatic: negative Breast: negative Musculoskeletal: negative Remaining ROS negative.   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  FAMILY HISTORY:  The patient's mother Aleea Hendry, currently 19, was diagnosed with breast cancer at the age of 45.  She was treated with tamoxifen, which she tolerated well.  The patient's father, 14, is alive with a history of prostate cancer presumably cured.  The patient has two sisters with no history of cancer.  She has no brothers.  GYNECOLOGIC HISTORY:  She is Gx, P0.  She never took hormone replacement after her surgery, so she still has her ovaries in place.  She did take birth control pills remotely with no problems.  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  She is unmarried who has a boyfriend who is frequently at home with her, but they do not officially live together.  She is not a church attender although she describes herself a spiritual.  Medications:   I have reviewed the patient's current medications.  Current Outpatient Prescriptions  Medication Sig Dispense Refill  . ALPRAZolam (XANAX) 0.25 MG  tablet Take 0.25 mg by mouth at bedtime as needed.        Marland Kitchen levothyroxine (SYNTHROID, LEVOTHROID) 50 MCG tablet Take 50 mcg by mouth daily.        . megestrol (MEGACE) 20 MG tablet Take 20 mg by mouth at bedtime.        . Multiple Vitamin (MULTIVITAMIN) capsule Take 1 capsule by mouth daily.        . naproxen sodium (ANAPROX) 220 MG tablet Take 220 mg by mouth 2 (two) times daily with a meal.          Allergies:  Allergies  Allergen Reactions  . Venlafaxine Hcl Nausea And Vomiting    Physical Exam: Filed Vitals:   10/09/11 1357  BP: 128/79  Pulse: 67  Temp: 98.2 F (36.8 C)   HEENT:  Sclerae anicteric, conjunctivae pink.  Oropharynx clear.  No mucositis or candidiasis.   Nodes:  No cervical, supraclavicular, or axillary lymphadenopathy palpated.  Breast Exam:  Right breast, status post mastectomy with implant reconstruction. No suspicious nodularity, skin changes, or evidence of local recurrence in the chest wall. Left breast is benign, no masses, discharge, skin change, or nipple inversion.   Lungs:  Clear to auscultation bilaterally.  No crackles, rhonchi, or wheezes.   Heart:  Regular rate and rhythm.   Abdomen:  Soft, nontender.  Positive bowel sounds.  No organomegaly or masses palpated.   Musculoskeletal:  No focal spinal tenderness to palpation.  Extremities:  Benign.  No peripheral edema or cyanosis.   Skin:  Benign.   Neuro:  Nonfocal.   Lab Results: Lab Results  Component Value Date   WBC 7.8 10/09/2011   HGB 12.9 10/09/2011   HCT 37.1 10/09/2011   MCV 96.1 10/09/2011   PLT 161 10/09/2011   NEUTROABS 4.1 10/09/2011     Chemistry      Component Value Date/Time   NA 143 07/13/2010 1252   K 4.3 07/13/2010 1252   CL 108 07/13/2010 1252   CO2 27 07/13/2010 1252   BUN 16 07/13/2010 1252   CREATININE 1.06 07/13/2010 1252      Component Value Date/Time   CALCIUM 9.5 07/13/2010 1252   ALKPHOS 51 07/13/2010 1252   AST 18 07/13/2010 1252   ALT 16 07/13/2010 1252   BILITOT  0.7 07/13/2010 1252        Radiological Studies:  08/27/2011 DIGITAL DIAGNOSTIC LEFT MAMMOGRAM WITH CAD  Comparison: 09/08/2010 and prior studies dating back to  06/18/2006.  Findings: Routine and implant displaced views of the left breast  demonstrate scattered fibroglandular densities.  There is no evidence of mass, distortion or suspicious  calcifications.  A subpectoral implant is again noted.  Mammographic images were processed with CAD.  IMPRESSION:  No specific mammographic evidence of left breast malignancy.  Subpectoral implant.  These findings and causes of breast pain were discussed with the  patient and her questions answered. She was encouraged to  begin/continue monthly self exams and to contact her primary  physician if any changes noted.  BI-RADS CATEGORY 1: Negative.  Recommend left screening mammogram in 1 year.  Original Report Authenticated By: Rosendo Gros, M.D.   Assessment:  This is a 56 year old [place] woman,  1. Status post right mastectomy and sentinel lymph node sampling February of 2010 for a microinvasive ductal carcinoma (1 mm), node negative, grade 1, triple positive, with an MIB-1 of 9%.  Margins were wide except posteriorly which was at the fascia and she is status post right implant placement.    2.  She received a total of one year of Herceptin (completed March of 2011) with an echocardiogram after the completion of Herceptin showing a well preserved ejection fraction at 55% to 60%.    3.  She has been on tamoxifen, Femara, Aromasin and more tamoxifen, but unable to tolerate it.  She is on Megace 20 mg nightly for control of hot flashes.   Plan:  This case was reviewed with Dr. Darnelle Catalan once again today. He is still very comfortable with Gina Owens not being on anti-estrogen therapy at this time. She would really prefer not to begin another medication at this time, and would like to continue to be followed annually with observation  alone.  If she changed her mind, our next plan of action would be to try anastrozole which she has not been on in the past. We would need to repeat her hormone levels, specifically an LH and estradiol level, to ensure sure she is postmenopausal. She status post hysterectomy, but still has both ovaries intact.  Otherwise, Nyajah will be due for her left mammogram in November of 2013, and return to see Dr. Darnelle Catalan approximately one year for routine followup.  This plan was reviewed with the patient, who voices understanding and agreement.  She knows to call with any changes or problems.    Stafford Riviera, PA-C 10/09/2011

## 2012-02-20 ENCOUNTER — Other Ambulatory Visit: Payer: Self-pay | Admitting: *Deleted

## 2012-02-20 DIAGNOSIS — C50911 Malignant neoplasm of unspecified site of right female breast: Secondary | ICD-10-CM

## 2012-02-20 MED ORDER — MEGESTROL ACETATE 20 MG PO TABS: 20.0000 mg | ORAL_TABLET | Freq: Every day | ORAL | Status: AC

## 2012-02-20 NOTE — Telephone Encounter (Signed)
Pt called to this RN to state need for refill on Megace . She states per pharmacy request it was denied.  Of note pt is currently residing in [place] and would need prescription called to her local [facility] at [phone].  This RN reviewed pt's chart and verified prescription with AB/PA.  Refill called to her pharmacy.

## 2012-08-29 ENCOUNTER — Ambulatory Visit: Payer: BC Managed Care – PPO

## 2012-09-02 ENCOUNTER — Other Ambulatory Visit: Payer: Self-pay | Admitting: Oncology

## 2012-09-03 ENCOUNTER — Telehealth: Payer: Self-pay | Admitting: Oncology

## 2012-09-03 NOTE — Telephone Encounter (Signed)
lmonvm advising the pt that her dec 5th appts had to be cancelled and r/s to jan due to a change in the md's schedule

## 2012-09-04 ENCOUNTER — Other Ambulatory Visit: Payer: BC Managed Care – PPO | Admitting: Lab

## 2012-09-04 ENCOUNTER — Ambulatory Visit: Payer: BC Managed Care – PPO | Admitting: Oncology

## 2012-09-09 ENCOUNTER — Other Ambulatory Visit: Payer: BC Managed Care – PPO | Admitting: Lab

## 2012-09-16 ENCOUNTER — Ambulatory Visit: Payer: BC Managed Care – PPO | Admitting: Oncology

## 2012-10-27 ENCOUNTER — Other Ambulatory Visit: Payer: Self-pay | Admitting: *Deleted

## 2012-10-28 ENCOUNTER — Other Ambulatory Visit: Payer: BC Managed Care – PPO | Admitting: Lab

## 2012-10-28 ENCOUNTER — Ambulatory Visit: Payer: BC Managed Care – PPO | Admitting: Oncology

## 2013-05-18 ENCOUNTER — Other Ambulatory Visit: Payer: Self-pay | Admitting: Oncology

## 2013-05-18 DIAGNOSIS — C50919 Malignant neoplasm of unspecified site of unspecified female breast: Secondary | ICD-10-CM

## 2013-07-02 ENCOUNTER — Other Ambulatory Visit: Payer: Self-pay | Admitting: Obstetrics & Gynecology

## 2013-07-03 NOTE — Telephone Encounter (Signed)
eScribe request for refill on LEXAPRO Last filled - 04/01/12, #90 x 2 Last AEX - 08/20/11 Next AEX - not scheduled Pt has not been seen in office since 11/2011. Please advise refills.  Paper chart on your desk.

## 2013-07-07 NOTE — Telephone Encounter (Signed)
Routed to Dr. Silva. 

## 2013-07-08 NOTE — Telephone Encounter (Signed)
Prescription declined.   Patient will need to be seen to have any refills.

## 2013-07-08 NOTE — Telephone Encounter (Signed)
Called patient to try and schedule she is living in [place] where a Doctor prescribes her Lexapro there.

## 2017-02-13 ENCOUNTER — Other Ambulatory Visit: Payer: Self-pay | Admitting: Oncology

## 2017-02-18 ENCOUNTER — Telehealth: Payer: Self-pay | Admitting: Oncology

## 2017-02-18 DIAGNOSIS — M1990 Unspecified osteoarthritis, unspecified site: Secondary | ICD-10-CM

## 2017-02-18 DIAGNOSIS — R634 Abnormal weight loss: Secondary | ICD-10-CM

## 2017-02-18 DIAGNOSIS — K769 Liver disease, unspecified: Secondary | ICD-10-CM

## 2017-02-18 DIAGNOSIS — F419 Anxiety disorder, unspecified: Secondary | ICD-10-CM

## 2017-02-18 DIAGNOSIS — D219 Benign neoplasm of connective and other soft tissue, unspecified: Secondary | ICD-10-CM

## 2017-02-18 DIAGNOSIS — R9389 Abnormal findings on diagnostic imaging of other specified body structures: Secondary | ICD-10-CM

## 2017-02-18 DIAGNOSIS — E039 Hypothyroidism, unspecified: Secondary | ICD-10-CM

## 2017-02-18 DIAGNOSIS — K219 Gastro-esophageal reflux disease without esophagitis: Secondary | ICD-10-CM

## 2017-02-18 DIAGNOSIS — R1310 Dysphagia, unspecified: Secondary | ICD-10-CM

## 2017-02-18 DIAGNOSIS — C169 Malignant neoplasm of stomach, unspecified: Secondary | ICD-10-CM

## 2017-02-18 DIAGNOSIS — C50911 Malignant neoplasm of unspecified site of right female breast: Secondary | ICD-10-CM

## 2017-02-18 DIAGNOSIS — R1013 Epigastric pain: Secondary | ICD-10-CM

## 2017-02-18 NOTE — Progress Notes
Appointment Date: Future Appointments  Date Time Provider [place]   02/19/2017 9:00 AM Gracy Bruins, MD [facility]       PCP: Chauncey Reading    Chief Complaint: NPA: GASTRIC ADENO CARCINOMA    Allergies: Allergies not on file        Radiology: @10 :14am spk/with Ms. Mcmeans in regards to her 02/19/17 appt with Dr. Freddrick March.  Please bring in imaging /reports.  Ms. Neiss has PET imaging.      Pharmacy: No Pharmacies Listed    Checked By: Derrel Nip, MA    Date: 02/18/2017

## 2017-02-18 NOTE — Telephone Encounter (Signed)
Faxed pt records to [facility] [place]

## 2017-02-19 ENCOUNTER — Ambulatory Visit: Attending: Surgery | Primary: Internal Medicine

## 2017-02-19 ENCOUNTER — Ambulatory Visit: Admit: 2017-02-19 | Discharge: 2017-02-19 | Attending: Hematology & Oncology | Primary: Internal Medicine

## 2017-02-19 ENCOUNTER — Encounter: Attending: Hematology & Oncology | Primary: Internal Medicine

## 2017-02-19 DIAGNOSIS — R9389 Abnormal findings on diagnostic imaging of other specified body structures: Secondary | ICD-10-CM

## 2017-02-19 DIAGNOSIS — C50911 Malignant neoplasm of unspecified site of right female breast: Secondary | ICD-10-CM

## 2017-02-19 DIAGNOSIS — C169 Malignant neoplasm of stomach, unspecified: Secondary | ICD-10-CM

## 2017-02-19 DIAGNOSIS — K769 Liver disease, unspecified: Secondary | ICD-10-CM

## 2017-02-19 DIAGNOSIS — Z79899 Other long term (current) drug therapy: Secondary | ICD-10-CM

## 2017-02-19 DIAGNOSIS — M1990 Unspecified osteoarthritis, unspecified site: Secondary | ICD-10-CM

## 2017-02-19 DIAGNOSIS — K219 Gastro-esophageal reflux disease without esophagitis: Secondary | ICD-10-CM

## 2017-02-19 DIAGNOSIS — Z800 Family history of malignant neoplasm of digestive organs: Secondary | ICD-10-CM

## 2017-02-19 DIAGNOSIS — R634 Abnormal weight loss: Secondary | ICD-10-CM

## 2017-02-19 DIAGNOSIS — M47819 Spondylosis without myelopathy or radiculopathy, site unspecified: Secondary | ICD-10-CM

## 2017-02-19 DIAGNOSIS — Z803 Family history of malignant neoplasm of breast: Secondary | ICD-10-CM

## 2017-02-19 DIAGNOSIS — R590 Localized enlarged lymph nodes: Secondary | ICD-10-CM

## 2017-02-19 DIAGNOSIS — E039 Hypothyroidism, unspecified: Secondary | ICD-10-CM

## 2017-02-19 DIAGNOSIS — R1310 Dysphagia, unspecified: Secondary | ICD-10-CM

## 2017-02-19 DIAGNOSIS — Z8042 Family history of malignant neoplasm of prostate: Secondary | ICD-10-CM

## 2017-02-19 DIAGNOSIS — R1013 Epigastric pain: Secondary | ICD-10-CM

## 2017-02-19 DIAGNOSIS — D219 Benign neoplasm of connective and other soft tissue, unspecified: Secondary | ICD-10-CM

## 2017-02-19 DIAGNOSIS — M545 Low back pain: Secondary | ICD-10-CM

## 2017-02-19 DIAGNOSIS — C160 Malignant neoplasm of cardia: Secondary | ICD-10-CM

## 2017-02-19 DIAGNOSIS — Z79891 Long term (current) use of opiate analgesic: Secondary | ICD-10-CM

## 2017-02-19 DIAGNOSIS — F419 Anxiety disorder, unspecified: Secondary | ICD-10-CM

## 2017-02-19 DIAGNOSIS — M810 Age-related osteoporosis without current pathological fracture: Secondary | ICD-10-CM

## 2017-02-19 DIAGNOSIS — Z87891 Personal history of nicotine dependence: Secondary | ICD-10-CM

## 2017-02-19 DIAGNOSIS — G8929 Other chronic pain: Secondary | ICD-10-CM

## 2017-02-19 MED ORDER — DEXTROSE 5 % IV SOLN
Freq: Once | INTRAVENOUS | Status: CN
Start: 2017-02-19 — End: ?

## 2017-02-19 MED ORDER — ESZOPICLONE 3 MG PO TABS
2 refills
Start: 2017-02-19 — End: ?

## 2017-02-19 MED ORDER — OXALIPLATIN INFUSION BCN JX
85 mg/m2 | Freq: Once | INTRAVENOUS | Status: CN
Start: 2017-02-19 — End: ?

## 2017-02-19 MED ORDER — BIMATOPROST 0.03 % EX SOLN
11 refills
Start: 2017-02-19 — End: 2017-04-10

## 2017-02-19 MED ORDER — MEPERIDINE HCL 25 MG/ML IJ SOLN
25 mg | INTRAVENOUS | Status: CN | PRN
Start: 2017-02-19 — End: ?

## 2017-02-19 MED ORDER — HEPARIN SODIUM LOCK FLUSH 100 UNIT/ML IV SOLN
500 [IU] | Status: CN | PRN
Start: 2017-02-19 — End: ?

## 2017-02-19 MED ORDER — DICLOFENAC SODIUM 1 % TD GEL
0 refills
Start: 2017-02-19 — End: ?

## 2017-02-19 MED ORDER — ARMOUR THYROID 15 MG PO TABS
3 refills
Start: 2017-02-19 — End: ?

## 2017-02-19 MED ORDER — SODIUM CHLORIDE FLUSH 0.9 % IV SOLN
10 mL | Status: CN | PRN
Start: 2017-02-19 — End: ?

## 2017-02-19 MED ORDER — ALPRAZOLAM 0.5 MG PO TABS
2 refills
Start: 2017-02-19 — End: ?

## 2017-02-19 MED ORDER — DEXAMETHASONE SODIUM PHOSPHATE 20 MG/5ML IJ SOLN
20 mg | INTRAVENOUS | Status: CN | PRN
Start: 2017-02-19 — End: ?

## 2017-02-19 MED ORDER — TIZANIDINE HCL 4 MG PO TABS
0 refills
Start: 2017-02-19 — End: 2017-04-10

## 2017-02-19 MED ORDER — LEUCOVORIN IVPB JX
400 mg/m2 | Freq: Once | INTRAVENOUS | Status: CN
Start: 2017-02-19 — End: ?

## 2017-02-19 MED ORDER — FLUOROURACIL IV SYRINGE BCN JX
400 mg/m2 | Freq: Once | INTRAVENOUS | Status: CN
Start: 2017-02-19 — End: ?

## 2017-02-19 MED ORDER — FLUOROURACIL INFUSION AMB PUMP BCN JX
2400 mg/m2 | Freq: Once | INTRAVENOUS | Status: CN
Start: 2017-02-19 — End: ?

## 2017-02-19 MED ORDER — ONDANSETRON IVPB BCN JX
16 mg | Freq: Once | INTRAVENOUS | Status: CN
Start: 2017-02-19 — End: ?

## 2017-02-19 MED ORDER — TRAMADOL HCL 50 MG PO TABS
0 refills
Start: 2017-02-19 — End: 2017-04-10

## 2017-02-19 MED ORDER — CONNECT MEDICATION INFUSION JX
1 | Freq: Once | Status: CN
Start: 2017-02-19 — End: ?

## 2017-02-19 MED ORDER — HYDROCORTISONE NA SUCCINATE PF 100 MG IJ SOLR
100 mg | INTRAVENOUS | Status: CN | PRN
Start: 2017-02-19 — End: ?

## 2017-02-19 MED ORDER — OMEPRAZOLE 40 MG PO CPDR
5 refills
Start: 2017-02-19 — End: ?

## 2017-02-19 MED ORDER — ONDANSETRON 8 MG PO TBDP
8 mg | Freq: Three times a day (TID) | ORAL | 11 refills | Status: CP | PRN
Start: 2017-02-19 — End: 2017-09-16

## 2017-02-19 MED ORDER — BUPROPION HCL ER (XL) 300 MG PO TB24
2 refills
Start: 2017-02-19 — End: 2017-04-10

## 2017-02-19 MED ORDER — SUPREP BOWEL PREP KIT 17.5-3.13-1.6 GM/177ML PO SOLN
PACK | 0 refills
Start: 2017-02-19 — End: 2017-03-04

## 2017-02-19 MED ORDER — CARAFATE 1 GM/10ML PO SUSP
2 refills
Start: 2017-02-19 — End: 2017-10-21

## 2017-02-19 MED ORDER — DIPHENHYDRAMINE HCL 50 MG/ML IJ SOLN
25 mg | INTRAVENOUS | Status: CN | PRN
Start: 2017-02-19 — End: ?

## 2017-02-19 MED ORDER — DEXAMETHASONE IVPB BCN JX
20 mg | Freq: Once | INTRAVENOUS | Status: CN
Start: 2017-02-19 — End: ?

## 2017-02-19 MED ORDER — DICLOFENAC SODIUM ER 100 MG PO TB24
2 refills
Start: 2017-02-19 — End: 2017-04-10

## 2017-02-19 MED ORDER — DISCONNECT MEDICATION INFUSION JX
1 | Freq: Once | Status: CN
Start: 2017-02-19 — End: ?

## 2017-02-19 MED ORDER — PROCHLORPERAZINE MALEATE 10 MG PO TABS
10 mg | Freq: Four times a day (QID) | ORAL | 11 refills | Status: CP | PRN
Start: 2017-02-19 — End: ?

## 2017-02-19 NOTE — Progress Notes
Subjective:     I have gastric cancer       HPI                                 Past Medical History:   Diagnosis Date   ? Abnormal CT scan 02/05/2017    CT C/A/P w/ IV contrast   ? Anxiety disorder    ? Arthritis    ? 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 Chronic back pain    ? DJD (degenerative joint disease)     spine   ? Dysphagia    ? Epigastric pain     worsens with eating   ? Fibroids     uterine; s/p hysterectomy   ? 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 GERD (gastroesophageal reflux disease)    ? Hypothyroidism    ? Liver lesion, left lobe 02/04/2017   ? Weight loss, abnormal      Past Surgical History:   Procedure Laterality Date   ? BREAST RECONSTRUCTION  2016   ? CATARACT EXTRACTION W/ INTRAOCULAR LENS IMPLANT     ? COLONOSCOPY W/ BIOPSY  01/28/2017   ? EGD W/ BIOPSY  01/28/2017   ? EUS UPPER W/ FNA  02/08/2017   ? HYSTERECTOMY  2003    uterine fibroids   ? LASIK     ? MASTECTOMY  2011     Family History   Problem Relation Age of Onset   ? Breast Cancer Mother    ? Prostate Cancer Father    ? Colon Cancer Father      Social History     Social History   ? Marital status: Single     Spouse name: N/A   ? Number of children: 0   ? Years of education: N/A     Occupational History   ? Not on file.     Social History Main Topics   ? Smoking status: Former Smoker     Packs/day: 1.00     Years: 15.00     Types: Cigarettes     Start date: 06/24/1974     Quit date: 11/29/1988   ? Smokeless tobacco: Never Used   ? Alcohol use Yes   ? Drug use: Unknown   ? Sexual activity: Not on file     Other Topics Concern   ? Not on file     Social History Narrative   ? No narrative on file     No current outpatient prescriptions on file prior to visit.     No current facility-administered medications on file prior to visit.      No Known Allergies      Review of Systems  Review of Systems

## 2017-02-19 NOTE — Progress Notes
ICD-10-CM ICD-9-CM    1. Gastric adenocarcinoma C16.9 151.9           Plan:     I had along discussion with the patient regarding diagnosis and treatment options.     I recommend upfront chemotherapy and reevaluate afterwards.    Needs Port A cath.    Follow up in 2 months        Surgical Risk:     Risk Assessment

## 2017-02-19 NOTE — Progress Notes
heart beat and No parasternal heave  Breast:N/A  Chest:Symmetrical, No kyphosis and No scoliosis  Respiratory:Clear, No rales/Rhonchi and Percussion and palpation-Normal  Gastrointestinal:Abdomen soft, Abdomen non-tender, Abdomen non-distended, Abdomen without masses, No ascites, No hepatosplenomegaly and No hernia  Hematologic/Lymphatic:No petechiae, No purpura, No neck lymphadenopathy, No Axillary lymphadenopathy and No Groin lymphadenopathy  Musculoskeletal:Normal gait and station, Range of motion normal and Strength/Tone normal  Extremities:No edema, No cyanosis, No digital clubbing and No discoloration  Skin:Negative  Neurologic:Alert and oriented, Alerted orientation/alertness, No hemiplegia, No Hemiparesis, Cranial nerves intact, Paralysis cranial nerves, No sensory deficit and No motor deficits  Rectal Exam:Deferred  Genitourinary:Deferred    Assessment:    [name] with 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 The patient has a Mediport placed as of this Friday, 02/22/2017. I discussed today with her patient and her sister regarding her diagnosis, prognosis and treatments for this type of tumor. I explained in great detail the effects and side effects of chemotherapy especially neuropathy with oxaliplatin possible chest pain or discomfort with 5-FU, less of alopecia and no major other side effects. In general this tablet treatment is relatively well tolerated without excessive cytopenias. I prescribed antinausea medications in case of chemotherapy associated nausea and vomiting, to her local pharmacy. We did add toward with the patient and the sister of our chemotherapy unit and introduce her to our chemotherapy nurses. I have prescribed chemotherapy to start tentatively on Wednesday, Feb 27, 2017.

## 2017-02-19 NOTE — Progress Notes
George Hugh, MD  [address], [place] [address]    Chief Complaint   Patient presents with   ? New Patient   The patient has been referred to this clinic for workup and evaluation and treatment of adenocarcinoma of the GE junction.    Patient Name: Mariah Cameron    Date: 02/19/2017    Date Of Birth: [birth year]    History of Present Illness:    Ms. 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 The patient started having severe acid reflux syndrome in November 2017 and she ended up having an endoscopy on 01/28/2017 with also endoscopic ultrasound on 02/08/2017. Results of the pathology by biopsy of a fungating mass were discussed as above. According to the patient's report the tumor was HER-2 negative. She was initially evaluated by medical oncologist at [facility] [place] but she preferred to transfer her treatment to this facility and also to the [facility] and she has seen Dr. Freddrick March also as of yesterday, who recommended an MRI of the liver as well as neoadjuvant chemotherapy. I do not have results of her PET/CT scan but the patient told me verbally that this was negative for liver lesions, initially described at a recent CT scan of the upper and lower abdomen. There was no other distant metastasis but there was lymphadenopathy associated with the stomach cancer. The patient is scheduled to undergo a Mediport placement by Dr. Dara Lords as of Friday 02/22/2017. She was suggested to have FOLFOX chemotherapy as soon as possible.    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 She had

## 2017-02-19 NOTE — Progress Notes
Neurological    The patient does not have dizziness, headaches, tingling, tremors, sensory changes, speech change, focal weakness, seizures, recent loss of consciousness.    Psychiatry    Patient did not complain of depression, suicidal ideation, substance abuse, hallucinations, nervousness/anxiety, insomnia, memory loss.        Medications:   Current Outpatient Prescriptions:   ?  ALPRAZolam (XANAX) 0.5 MG PO Tablet, TK 1 T PO QHS, Disp: , Rfl: 2  ?  ARMOUR THYROID 15 MG PO Tablet, TK 3 TS PO ONCE D OES, Disp: , Rfl: 3  ?  bimatoprost (LATISSE) 0.03 % EX Solution, , Disp: , Rfl: 11  ?  buPROPion HCl (WELLBUTRIN XL) 300 MG PO Tablet Extended Release 24 Hour, TK 1 T   PO IN THE MORNING ONCE D, Disp: , Rfl: 2  ?  CARAFATE 1 GM/10ML PO Suspension, TK 10 ML PO TID OES 1 HOUR B MEALS AND HS, Disp: , Rfl: 2  ?  diclofenac (VOLTAREN GEL) 1 % TD Gel, APP AA BID UTD, Disp: , Rfl: 0  ?  Diclofenac Sodium CR 100 MG PO Tablet Extended Release 24 Hour, TK 1 T PO ONCE A DAY, Disp: , Rfl: 2  ?  eszopiclone (LUNESTA) 3 MG PO Tablet, , Disp: , Rfl: 2  ?  omeprazole (PriLOSEC) 40 MG PO Capsule Delayed Release, TK 1 C PO QD AC, Disp: , Rfl: 5  ?  SUPREP BOWEL PREP KIT 17.5-3.13-1.6 GM/180ML PO Solution, , Disp: , Rfl: 0  ?  tiZANidine (ZANAFLEX) 4 MG PO Tablet, TK 1 T PO QHS PRN, Disp: , Rfl: 0  ?  traMADol (ULTRAM) 50 MG PO Tablet, TK 1 T PO Q 6 H PRF PAIN, Disp: , Rfl: 0      Labs:   No results found for any previous visit.         Imaging Results:       Physical Exam:   General: Well developed, well nourished, No acute Distress, Chronically ill-looking, Lady patient and Appears stated age  Head:Atraumatic and normocephalic  Eyes:PERRLA, EOMS intact, No jaundice, pallor and Conjunctiva clear  Ears, Nose, Throat, Neck, and Mouth: Trachea Midline, No JVD, No Lymphadenopathy, Thyroid midline-normal, Neck supple, No oral lesions and Tracheostomy not present  Cardiovascular:S1,S2 no murmurs, No rubs or clicks, No bruits, Regular

## 2017-02-19 NOTE — Progress Notes
-  CC at 02/19/17 0843 No    -YK at 02/19/17 1342        User Key  (r) = Recorded By, (t) = Taken By, (c) = Cosigned By    Initials Name Effective Dates    Burns, [place], [place] 01/03/16 -     Starr Sinclair, Edmonia Caprio, MA 12/07/16 -         Physical Exam   Constitutional: She is oriented to person, place, and time. She appears well-developed and well-nourished. No distress.   HENT:   Head: Normocephalic and atraumatic.   Right Ear: External ear normal.   Left Ear: External ear normal.   Nose: Nose normal.   Mouth/Throat: Oropharynx is clear and moist.   Eyes: Conjunctivae and EOM are normal. Pupils are equal, round, and reactive to light. Right eye exhibits no discharge. Left eye exhibits no discharge. No scleral icterus.   Neck: Normal range of motion. Neck supple. No JVD present. No tracheal deviation present. No thyromegaly present.   Cardiovascular: Normal rate, regular rhythm, normal heart sounds and intact distal pulses.  Exam reveals no gallop and no friction rub.    No murmur heard.  Pulmonary/Chest: Effort normal and breath sounds normal. No stridor. No respiratory distress. She has no wheezes. She has no rales. She exhibits no tenderness.   Abdominal: Soft. Bowel sounds are normal. She exhibits no distension and no mass. There is no tenderness. There is no rebound and no guarding. No hernia.   Musculoskeletal: Normal range of motion. She exhibits no edema, tenderness or deformity.   Lymphadenopathy:     She has no cervical adenopathy.   Neurological: She is alert and oriented to person, place, and time. She displays normal reflexes. No cranial nerve deficit or sensory deficit. She exhibits normal muscle tone. Coordination normal.   Skin: Skin is warm. No rash noted. She is not diaphoretic. No erythema. No pallor.   Psychiatric: She has a normal mood and affect. Her behavior is normal. Judgment and thought content normal.     Physical Examination chaperoned by Bethena Roys      Assessment:

## 2017-02-19 NOTE — Progress Notes
Types: Cigarettes     Start date: 06/24/1974     Quit date: 11/29/1988   ? Smokeless tobacco: Never Used   ? Alcohol use Yes   ? Drug use: Unknown   ? Sexual activity: Not on file     Other Topics Concern   ? Not on file     Social History Narrative   ? No narrative on file       Family History:    Family History   Problem Relation Age of Onset   ? Breast Cancer Mother    ? Prostate Cancer Father    ? Colon Cancer Father        Vital Signs:   Vitals:    02/19/17 1342   Weight: 58.5 kg (129 lb)   Height: 1.702 m (5\' 7" )       Allergies: Patient has no known allergies.    Review of Systems:    This was performed today on 02/19/2917 and showed:      Constitutional    The patient does not report any fevers, chills, she has weight loss of 20# since 08/2016, she has malaise/fatigue, no diaphoresis, no major or lateralized weakness.    Skin    There is no systemic rash.  There is no pruritus.    HEENT    There is no hearing loss.  No tinnitus.  There is no ear pain, ear drainage, epistaxis, congestion, sinus pain, stridor, sore throat.    Eyes    There is no blurred vision, double vision, photophobia, eye pain, eye discharge, eye redness.    Cardiovascular    There is no chest pain, palpitations, orthopnea, claudication, leg swelling, PND.    Respiratory     There is no cough, hemoptysis, sputum production, shortness of breath, wheezing.    GI    There is history of heartburn since 08/2016, no nausea, vomiting, there is abdominal pain, post prandial, sharp and occ dull, 5-6/10 lasting for hours post eating, epigastric, fullness feeling, no diarrhea, constipation, no blood in the stool,no melena.    GU     There is no dysuria, urgency, frequency, hematuria, flank pain.    Musculoskeletal    There is no myalgia, neck pain, back pain, joint pain, falls.    Endocrine    There is no polyuria or polydipsia, intolerance to that heat or cold.    Hematology    There is no easy bruising or bleeding.  There is no epistaxis.

## 2017-02-19 NOTE — Progress Notes
Counseling/Coordination of Care and Prescription renewals    Return to Clinic:  in 2 weeks.      This document was created using voice recognition software. Inadvertent typographical errors, word omissions and/or word substitutions may exist.

## 2017-02-19 NOTE — Progress Notes
Constitutional: Positive for activity change, appetite change, fatigue and unexpected weight change.   HENT: Negative.    Eyes: Negative.    Respiratory: Negative.    Cardiovascular: Negative.    Gastrointestinal: Negative.    Endocrine: Negative.    Genitourinary: Negative.    Musculoskeletal: Negative.    Allergic/Immunologic: Negative.    Neurological: Negative.    Hematological: Negative.    Psychiatric/Behavioral: Negative.          Objective:        VITAL SIGNS (all recorded)      [place] Name 02/19/17 0840 02/19/17 1342             Amb Encounter Vitals    Weight 58.5 kg (129 lb)    -CC at 02/19/17 0843 58.5 kg (129 lb)    -YK at 02/19/17 1342      Height 1.702 m (5\' 7" )    -CC at 02/19/17 0843 1.702 m (5\' 7" )    -YK at 02/19/17 1342      BMI (Calculated) 20.25    -CC at 02/19/17 0843 20.25    -YK at 02/19/17 1342      BSA (Calculated - sq m) 1.66    -CC at 02/19/17 0843 1.66    -YK at 02/19/17 1342      BP 101/66    -CC at 02/19/17 0843  ?      BP Location Left upper arm    -CC at 02/19/17 0843  ?      Position Sitting    -CC at 02/19/17 0843 Sitting    -YK at 02/19/17 1342      Pulse 68    -CC at 02/19/17 0843  ?      Pulse Source Monitor    -CC at 02/19/17 0843 Apical    -YK at 02/19/17 1342      Pulse Quality  ? Normal    -YK at 02/19/17 1342      Resp 18    -CC at 02/19/17 0843  ?      Respiration Quality  ? Normal    -YK at 02/19/17 1342      Temp 36.4 ?C (97.5 ?F)    -CC at 02/19/17 0843  ?      Temperature Source  ? Tympanic    -YK at 02/19/17 1342      O2 Saturation 97 %    -CC at 02/19/17 0843  ?      Pain Score Zero    -CC at 02/19/17 0843 Zero    -YK at 02/19/17 1342         Education/Communication Barriers?    Learning/Communication Barriers? No    -CC at 02/19/17 0843 No    -YK at 02/19/17 1342         Fall Risk Assessment    Had recent fall / Last 6 months? No recent fall    -CC at 02/19/17 0843 No recent fall    -YK at 02/19/17 1342      Does patient have a fear of falling? No

## 2017-02-19 NOTE — Progress Notes
a wellness check in March 2018 and this was completely negative. The patient also has history of breast cancer in 2010 and she is status post right mastectomy with breast reconstruction. She did not receive chemotherapy but she was treated with Herceptin for 50 months by an oncologist in [place]. According to her report she could not tolerate tamoxifen. In 2016 she had an episode of vertigo and CT scan of the head showed dural thickness.    The patient has never noticed blood in the stool or in the urine, she never had the yellow jaundice, she never had hepatitis or blood transfusions. She has lost approximately 20 pounds since December 2017. She has difficulties with nutrition especially with solid food, as she has to cut the food in small pieces and chew for long time and she always gets pain postprandially.    The patient used to be a smoker for 10 years but she quit in 1989 and she smoked approximately one pack per day. She used to drink wine.    Regarding her family history her father has dementia and heart disease. Her mother had breast cancer at the age of 21 her great-grandmother also had breast cancer. There is no ovarian cancer in the family. Her father has history of prostate cancer and colon cancer. Her paternal grandmother had also colon cancer.    Past Surgical History:   Past Surgical History:   Procedure Laterality Date   ? BREAST RECONSTRUCTION  2016   ? CATARACT EXTRACTION W/ INTRAOCULAR LENS IMPLANT     ? COLONOSCOPY W/ BIOPSY  01/28/2017   ? EGD W/ BIOPSY  01/28/2017   ? EUS UPPER W/ FNA  02/08/2017   ? HYSTERECTOMY  2003    uterine fibroids   ? LASIK     ? MASTECTOMY  2011       Social History:    Social History     Social History   ? Marital status: Single     Spouse name: N/A   ? Number of children: 0   ? Years of education: N/A     Occupational History   ? Not on file.     Social History Main Topics   ? Smoking status: Former Smoker     Packs/day: 1.00     Years: 15.00

## 2017-02-19 NOTE — Progress Notes
Given her heavy family history of breast cancer, colon cancer, prostate cancer, I have requested a genetic evaluation testing by myriad laboratories. At [facility] testing was requested and results need to be followed for molecular biology of the tumor to include MSI status or other clinically relevant molecular markers.    The patient will return to this clinic for follow-up, most probable with Dr. Lurena Joiner, in 2 weeks after her first round of chemotherapy to assess for toxicity and possible side effects.    The patient and her sister had a long list of questions regarding diagnosis, treatment, sequencing of multi modality treatments, side effects of chemotherapy and this questions were answered to the best of my knowledge during this initial evaluation.    It is of note is that the knee CT scan of the chest abdomen and pelvis from Feb 05, 2017 to indeterminate subcentimeter low-density lesions were noted within the lateral segment of the left hepatic lobe. These were biopsied by fine-needle aspiration at [facility] and were negative for malignancy. Most recent PET CT scan at Dr. Dario Guardian facility did not confirm liver metastasis.    With today's evaluation also I have requested baseline laboratory testing to include a CBC, CMP, LDH, CEA.    Recommendation/Plan:   Return to clinic, Test ordered, Chemo ordered FOLFOX, Review/Obtain old records, Diagnosis and Care Plan discussed, Reviewed NCCN Guidelines , Prognostic/predictive markers reviewed, Treatment Goal-Disease Free Survival, Literature review, Risk/Treatment Benefit, Stable/Treatment ordered, Chemo Education, Side effect reviewed, Bone involvement/Bisphosphonates, Reviewed contacting office//after hours contact, Chemotherapy plan reviewed, Restaging Plan, Alternative Options discussed, Supportive Care Measures discussed, Dehydration/IV Fluids, Radiology Review, Mediport placement, All questions answered,

## 2017-02-21 NOTE — Progress Notes
Feb 19, 2017    INDICATION FOR ENCOUNTER:  Gastric cancer.    I had the pleasure of seeing Mariah Cameron in my office today.  She is a 61 year old Caucasian female with a longstanding history of gastroesophageal reflux disease.  She was started on omeprazole and ranitidine with minimal improvement.  She denies any nausea or vomiting.  She had lost approximately 20 pounds because of poor dietary intake.      Upper endoscopy done on January 28, 2017, showed localized mucosal changes and congestion with erythema in the gastric fundus and gastric body.  Biopsies were consistent with a moderately to poorly differentiated adenocarcinoma.    CT scan of the abdomen, pelvis and chest on Feb 05, 2017, showed thickening of the gastric wall of the cardia with a polypoid lesion protruding into the gastric lumen suggesting malignancy.  There were two indeterminate subcentimeter low-density lesions within the left lateral segment of the liver, which are too small to characterize.    She underwent an endoscopic ultrasound on Feb 08, 2017, showing 2 metastatic lesions in the left lobe of the liver, which were FNA'd.  Biopsies were negative for malignancy.  There were many malignant-appearing lymph nodes in the splenic region, celiac region, perigastric and peripancreatic region.  A large infiltrating mass extending from the GE junction to the lesser curvature of the stomach was found in the cardia, fundus and lesser curvature of the stomach.  She was staged by ultrasound as T3 N3 M1.      PET scan done on Feb 14, 2017, showed focal increased uptake in the anterior wall of the stomach and the fundus with an SUV of 5 and in the gastrohepatic lymph nodes.      ECOG is 0.      I reviewed all the medical record, CT scan and PET scan images.      I had a long discussion with [name] regarding my recommendation including chemotherapy, either 5-FU with oxaliplatin versus ECF per [facility]

## 2017-02-21 NOTE — Progress Notes
trial.  I also recommended sending samples for MSI PD-L1 Foundation testing.      Apparently, Mariah Cameron is seeking to travel to [facility] for [place] and I left it up to her whether she wants to pursue that route or not.      I strongly encouraged her to pay particular attention to the dietary requirement, nutrition, and if needed, we can place a laparoscopic jejunostomy tube to maintain adequate caloric intake.      I spent 60 minutes discussing her case and treatment options and the main emphasis was on the potential metastatic 2 liver lesions that were seen on the CT scan as well as the endoscopic ultrasound, and while the FNA was negative, I feel very suspicious that they could be positive.  I recommend obtaining MRI of the liver for better definition of the lesions.  Having said that, I do not think the liver lesions would change the treatment plan I suggested.      I will follow up with Mariah Cameron in my office in 2 months' time.    Thank you for the referral and the opportunity to participate in the care of this pleasant lady.    Sincerely yours,      Gracy Bruins, MD    ZA/NTS  DD:  02/19/2017 15:28:27  DT:  02/19/2017 16:03:19  Job#:  [id number]  Conf#:  [id number]

## 2017-02-21 NOTE — Progress Notes
Feb 19, 2017    INDICATION FOR ENCOUNTER:  Gastric cancer.    I had the pleasure of seeing Mariah Cameron in my office today.  She is a 61 year old Caucasian female with a longstanding history of gastroesophageal reflux disease.  She was started on omeprazole and ranitidine with minimal improvement.  She denies any nausea or vomiting.  She had lost approximately 20 pounds because of poor dietary intake.      Upper endoscopy done on January 28, 2017, showed localized mucosal changes and congestion with erythema in the gastric fundus and gastric body.  Biopsies were consistent with a moderately to poorly differentiated adenocarcinoma.    CT scan of the abdomen, pelvis and chest on Feb 05, 2017, showed thickening of the gastric wall of the cardia with a polypoid lesion protruding into the gastric lumen suggesting malignancy.  There were two indeterminate subcentimeter low-density lesions within the left lateral segment of the liver, which are too small to characterize.    She underwent an endoscopic ultrasound on Feb 08, 2017, showing  2 metastatic lesions in the left lobe of the liver, which were FNA'd.  Biopsies were negative for malignancy.  There were many malignant-appearing lymph nodes in the splenic region, celiac region, perigastric and peripancreatic region.  A large infiltrating mass extending from the GE junction to the lesser curvature of the stomach was found in the cardia, fundus and lesser curvature of the stomach.  She was staged by ultrasound as T3 N3 M1.      PET scan done on Feb 14, 2017, showed focal increased uptake in the anterior wall of the stomach and the fundus with an SUV of 5 and in the gastrohepatic lymph nodes.      ACOG is 0.      I reviewed all the medical record, CT scan and PET scan images.      I had a long discussion with Mariah Cameron regarding my recommendation including chemotherapy, either 5-FU with oxaliplatin versus ECF per [facility]

## 2017-02-21 NOTE — Progress Notes
trial.  I also recommended sending samples for MSI PD-L1 Foundation testing.      Apparently, Mariah Cameron is seeking to travel to [facility] for [place] and I left it up to her whether she wants to pursue that route or not.      I strongly encouraged her to pay particular attention to the dietary requirement, nutrition, and if needed, we can place a laparoscopic jejunostomy tube to maintain adequate caloric intake.      I spent 60 minutes discussing her case and treatment options and the main emphasis was on the potential metastatic 2 liver lesions that were seen on the CT scan as well as the endoscopic ultrasound, and while the FNA was negative, I feel very suspicious that they could be positive.  I recommend obtaining MRI of the liver for better definition of the lesions.  Having said that, I do not think the liver lesions would change the treatment plan I suggested.      I will follow up with Mariah Cameron in my office in 2 months' time.    Thank you for the referral and the opportunity to participate in the care of this pleasant lady.    Sincerely yours,      Gracy Bruins, MD, FACS    ZA/NTS  DD:  02/19/2017 15:28:27  DT:  02/19/2017 16:03:19  Job#:  [id number]  Conf#:  [id number]

## 2017-02-26 ENCOUNTER — Inpatient Hospital Stay: Admit: 2017-02-26 | Discharge: 2017-02-27 | Primary: Internal Medicine

## 2017-02-26 DIAGNOSIS — Z538 Procedure and treatment not carried out for other reasons: Principal | ICD-10-CM

## 2017-02-26 DIAGNOSIS — C169 Malignant neoplasm of stomach, unspecified: Secondary | ICD-10-CM

## 2017-02-28 ENCOUNTER — Encounter: Attending: Surgery | Primary: Internal Medicine

## 2017-03-04 ENCOUNTER — Ambulatory Visit: Admit: 2017-03-04 | Discharge: 2017-03-05 | Attending: Hematology & Oncology | Primary: Internal Medicine

## 2017-03-04 ENCOUNTER — Inpatient Hospital Stay: Admit: 2017-03-04 | Discharge: 2017-03-05 | Primary: Internal Medicine

## 2017-03-04 DIAGNOSIS — K769 Liver disease, unspecified: Secondary | ICD-10-CM

## 2017-03-04 DIAGNOSIS — Z9011 Acquired absence of right breast and nipple: Secondary | ICD-10-CM

## 2017-03-04 DIAGNOSIS — Z5111 Encounter for antineoplastic chemotherapy: Secondary | ICD-10-CM

## 2017-03-04 DIAGNOSIS — E039 Hypothyroidism, unspecified: Secondary | ICD-10-CM

## 2017-03-04 DIAGNOSIS — R9389 Abnormal findings on diagnostic imaging of other specified body structures: Secondary | ICD-10-CM

## 2017-03-04 DIAGNOSIS — M1990 Unspecified osteoarthritis, unspecified site: Secondary | ICD-10-CM

## 2017-03-04 DIAGNOSIS — Z90710 Acquired absence of both cervix and uterus: Secondary | ICD-10-CM

## 2017-03-04 DIAGNOSIS — R634 Abnormal weight loss: Secondary | ICD-10-CM

## 2017-03-04 DIAGNOSIS — Z79891 Long term (current) use of opiate analgesic: Secondary | ICD-10-CM

## 2017-03-04 DIAGNOSIS — C169 Malignant neoplasm of stomach, unspecified: Principal | ICD-10-CM

## 2017-03-04 DIAGNOSIS — Z87891 Personal history of nicotine dependence: Secondary | ICD-10-CM

## 2017-03-04 DIAGNOSIS — C160 Malignant neoplasm of cardia: Secondary | ICD-10-CM

## 2017-03-04 DIAGNOSIS — Z853 Personal history of malignant neoplasm of breast: Secondary | ICD-10-CM

## 2017-03-04 DIAGNOSIS — K219 Gastro-esophageal reflux disease without esophagitis: Principal | ICD-10-CM

## 2017-03-04 DIAGNOSIS — R1310 Dysphagia, unspecified: Secondary | ICD-10-CM

## 2017-03-04 DIAGNOSIS — F419 Anxiety disorder, unspecified: Secondary | ICD-10-CM

## 2017-03-04 DIAGNOSIS — C50911 Malignant neoplasm of unspecified site of right female breast: Secondary | ICD-10-CM

## 2017-03-04 DIAGNOSIS — R1013 Epigastric pain: Secondary | ICD-10-CM

## 2017-03-04 DIAGNOSIS — Z79899 Other long term (current) drug therapy: Secondary | ICD-10-CM

## 2017-03-04 DIAGNOSIS — D219 Benign neoplasm of connective and other soft tissue, unspecified: Secondary | ICD-10-CM

## 2017-03-04 MED ORDER — FLUOROURACIL IV SYRINGE BCN JX
400 mg/m2 | Freq: Once | INTRAVENOUS | Status: CP
Start: 2017-03-04 — End: ?

## 2017-03-04 MED ORDER — HYDROCODONE-ACETAMINOPHEN 5-325 MG PO TABS
ORAL_TABLET | 0 refills
Start: 2017-03-04 — End: ?

## 2017-03-04 MED ORDER — DEXTROSE 5 % IV SOLN
Freq: Once | INTRAVENOUS | Status: CP
Start: 2017-03-04 — End: ?

## 2017-03-04 MED ORDER — FLUOROURACIL INFUSION AMB PUMP BCN JX
2400 mg/m2 | Freq: Once | INTRAVENOUS | Status: DC
Start: 2017-03-04 — End: 2017-03-05

## 2017-03-04 MED ORDER — FOSAPREPITANT IVPB JX
150 mg | Freq: Once | INTRAVENOUS | Status: CN
Start: 2017-03-04 — End: ?

## 2017-03-04 MED ORDER — LEUCOVORIN IVPB JX
400 mg/m2 | Freq: Once | INTRAVENOUS | Status: CP
Start: 2017-03-04 — End: ?

## 2017-03-04 MED ORDER — ONDANSETRON IVPB BCN JX
4 mg | Freq: Once | INTRAVENOUS | Status: CN
Start: 2017-03-04 — End: ?

## 2017-03-04 MED ORDER — OXALIPLATIN INFUSION BCN JX
85 mg/m2 | Freq: Once | INTRAVENOUS | Status: DC
Start: 2017-03-04 — End: 2017-03-05

## 2017-03-04 MED ORDER — ONDANSETRON WITH DEXAMETHASONE IVPB (NON-SPECIFIED)
Freq: Once | INTRAVENOUS | Status: CP
Start: 2017-03-04 — End: ?

## 2017-03-04 MED ORDER — DOK 100 MG PO CAPS
0 refills
Start: 2017-03-04 — End: 2017-04-10

## 2017-03-04 NOTE — Progress Notes
?   Lymphocytes Absolute 02/19/2017 2967    ? Monocytes Absolute 02/19/2017 919    ? Eosinophils Absolute 02/19/2017 127    ? Basophils Absolute 02/19/2017 46    ? Neutrophils 02/19/2017 55.4    ? LYMPHOCYTES 02/19/2017 32.6    ? MONOCYTES 02/19/2017 10.1    ? EOSINOPHILS 02/19/2017 1.4    ? BASOPHILS 02/19/2017 0.5    ? Glucose 02/19/2017 80    ? Urea Nitrogen 02/19/2017 21    ? Creatinine 02/19/2017 0.73    ? EGFR 02/19/2017 91    ? Glom Filt Rate, Est Afri* 02/19/2017 105    ? BUN/Creatinine Ratio 73/71/0626 NOT APPLICABLE    ? Sodium 02/19/2017 140    ? Potassium 02/19/2017 4.4    ? Chloride 02/19/2017 104    ? CARBON DIOXIDE 02/19/2017 25    ? Calcium 02/19/2017 9.7    ? Protein, Total 02/19/2017 6.4    ? ALBUMIN 02/19/2017 4.5    ? Globulin 02/19/2017 1.9    ? ALBUMIN/GLOBULIN RATIO 02/19/2017 2.4    ? Total Bilirubin 02/19/2017 0.5    ? Alkaline Phosphatase 02/19/2017 66    ? AST 02/19/2017 14    ? ALT 02/19/2017 11    ? LD 02/19/2017 124    ? CEA 02/19/2017 1.5      02/19/2017 CBC, CMP grossly normal, CEA 1.5, LDH 124    5/4/2018A. 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 SEE COMMENT    B. ANTRUM (ENDOSCOPIC BIOPSY):   -  MILD CHRONIC GASTRITIS WITH MINIMAL ACTIVITY.   -  NO HELICOBACTER PYLORI IDENTIFIED BY MORPHOLOGICAL EVALUATION.   -  NO INTESTINAL METAPLASIA, DYSPLASIA OR MALIGNANCY IDENTIFIED.    C. POLYP DESCENDING (ENDOSCOPIC BIOPSY):   -  TUBULAR ADENOMA   -  NEGATIVE FOR HIGH-GRADE DYSPLASIA AND MALIGNANCY  HER2 Non-Breast: NEGATIVE  Score: 1+    02/08/2017. LIVER (FINE NEEDLE ASPIRATION):   -  NO EVIDENCE OF MALIGNANCY IDENTIFIED.    -  SMALL DETACHED FRAGMENTS OF LIVER PARENCHYMA AND SUPERFICIAL GASTRIC        FOVEOLAR EPITHELIUM.    Imaging Results:     02/14/2017, PET/CT scan showed FDG uptake in the anterior wall of the antrum consistent with malignancy, 1.87 with a lymph node within the

## 2017-03-04 NOTE — Progress Notes
No referring provider defined for this encounter.    No chief complaint on file.  The patient has been referred to this clinic for workup and evaluation and treatment of adenocarcinoma of the GE junction.    Patient Name: Mariah Cameron    Date: 02/19/2017    Date Of Birth: September 06, 1956    History of Present Illness:    Date: 02/19/2017    Ms. 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 The patient started having severe acid reflux syndrome in November 2017 and she ended up having an endoscopy on 01/28/2017 with also endoscopic ultrasound on 02/08/2017. Results of the pathology by biopsy of a fungating mass were discussed as above. According to the patient's report the tumor was HER-2 negative. She was initially evaluated by medical oncologist at [facility] [place] but she preferred to transfer her treatment to this facility and also to the [place] and she has seen Dr. Freddrick March also as of yesterday, who recommended an MRI of the liver as well as neoadjuvant chemotherapy. I do not have results of her PET/CT scan but the patient told me verbally that this was negative for liver lesions, initially described at a recent CT scan of the upper and lower abdomen. There was no other distant metastasis but there was lymphadenopathy associated with the stomach cancer. The patient is scheduled to undergo a Mediport placement by Dr. Dara Lords as of Friday 02/22/2017. She was suggested to have FOLFOX chemotherapy as soon as possible.    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 She had a wellness check in March 2018 and this was completely negative. The

## 2017-03-04 NOTE — Progress Notes
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 The patient has a Mediport placed as of this Friday, 02/22/2017. I discussed today with her patient and her sister regarding her diagnosis, prognosis and treatments for this type of tumor. I explained in great detail the effects and side effects of chemotherapy especially neuropathy with oxaliplatin possible chest pain or discomfort with 5-FU, less of alopecia and no major other side effects. In general this tablet treatment is relatively well tolerated without excessive cytopenias. I prescribed antinausea medications in case of chemotherapy associated nausea and vomiting, to her local pharmacy. We did add toward with the patient and the sister of our chemotherapy unit and introduce her to our chemotherapy nurses. I have prescribed chemotherapy to start tentatively on Wednesday, Feb 27, 2017.    Given her heavy family history of breast cancer, colon cancer, prostate cancer, I have requested a genetic evaluation testing by myriad laboratories. At [facility] [place] Foundation testing was requested and results need to be followed for molecular biology of the tumor to include MSI status or other clinically relevant molecular markers.    The patient will return to this clinic for follow-up, most probable with Dr. Lurena Joiner, in 2 weeks after her first round of chemotherapy to assess for toxicity and possible side effects.    The patient and her sister had a long list of questions regarding diagnosis, treatment, sequencing of multi modality treatments, side effects of chemotherapy and this questions were answered to the best of my knowledge during this initial evaluation.    It is of note is that the knee CT scan of the chest abdomen and pelvis

## 2017-03-04 NOTE — Progress Notes
she preferred to transfer her treatment to this facility and also to the [place] and she has seen Dr. Freddrick March also as of yesterday, who recommended an MRI of the liver as well as neoadjuvant chemotherapy. I do not have results of her PET/CT scan but the patient told me verbally that this was negative for liver lesions, initially described at a recent CT scan of the upper and lower abdomen. There was no other distant metastasis but there was lymphadenopathy associated with the stomach cancer. The patient is scheduled to undergo a Mediport placement by Dr. Dara Lords as of Friday 02/22/2017. She was suggested to have FOLFOX chemotherapy as soon as possible.  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 Patient couldn't tolerate tamoxifen, which has been discontinued. Patient also BRCA 1 and 2 negative in the past  Patient is here for the first course of chemotherapy with FOLFOX. She has many questions about her disease process as well as the biopsy results and also MRI results. I have discussed this in length with the patient and felt that currently she is only as stage III disease by MRI. And as per Dr. Freddrick March, surgeons recommendation I will proceed with FOLFOX chemotherapy at this time. Patient still has mild dysphagia but able to bring labs of protein supplements. Denies any fever or chills cough or chest pain or shortness of breath. I left a message with Dr. Robbie Lis, oncologist from [facility]

## 2017-03-04 NOTE — Progress Notes
she preferred to transfer her treatment to this facility and also to the [place] and she has seen Dr. Freddrick March also as of yesterday, who recommended an MRI of the liver as well as neoadjuvant chemotherapy. I do not have results of her PET/CT scan but the patient told me verbally that this was negative for liver lesions, initially described at a recent CT scan of the upper and lower abdomen. There was no other distant metastasis but there was lymphadenopathy associated with the stomach cancer. The patient is scheduled to undergo a Mediport placement by Dr. Dara Lords as of Friday 02/22/2017. She was suggested to have FOLFOX chemotherapy as soon as possible.    Subjectively,      Past Surgical History:   Past Surgical History:   Procedure Laterality Date   ? BREAST RECONSTRUCTION  2016   ? CATARACT EXTRACTION W/ INTRAOCULAR LENS IMPLANT     ? COLONOSCOPY W/ BIOPSY  01/28/2017   ? EGD W/ BIOPSY  01/28/2017   ? EUS UPPER W/ FNA  02/08/2017   ? HYSTERECTOMY  2003    uterine fibroids   ? LASIK     ? MASTECTOMY  2011       Social History:    Social History     Social History   ? Marital status: Single     Spouse name: N/A   ? Number of children: 0   ? Years of education: N/A     Occupational History   ? Not on file.     Social History Main Topics   ? Smoking status: Former Smoker     Packs/day: 1.00     Years: 15.00     Types: Cigarettes     Start date: 06/24/1974     Quit date: 11/29/1988   ? Smokeless tobacco: Never Used   ? Alcohol use Yes   ? Drug use: Unknown   ? Sexual activity: Not on file     Other Topics Concern   ? Not on file     Social History Narrative   ? No narrative on file       Family History:    Family History   Problem Relation Age of Onset   ? Breast Cancer Mother    ? Prostate Cancer Father    ? Colon Cancer Father        Vital Signs:   There were no vitals filed for this visit.    Allergies: Patient has no known allergies.    Review of Systems:

## 2017-03-04 NOTE — Progress Notes
stomach and liver, no obvious liver metastasis  02/21/2017, MRI of the abdomen there is 4-5 mm nonenhancing lesion noted within the left lobe of the liver which were present and atypical cyst or small hemangioma no obvious additional lesions are seen within the liver, there is a mass within the stomach consistent with patient's history of stomach cancer  I have reviewed these reports with the patient today March 04, 2017  EUS, staged as T3 N3, M1 although biopsy of the liver negative for malignancy as his MRI and the PET scan    Physical Exam: This was performed today on 03/04/2017 and indicated:    General: Well developed, well nourished, No acute Distress, Chronically ill-looking, Lady patient and Appears stated age  Head:Atraumatic and normocephalic  Eyes:PERRLA, EOMS intact, No jaundice, pallor and Conjunctiva clear  Ears, Nose, Throat, Neck, and Mouth: Trachea Midline, No JVD, No Lymphadenopathy, Thyroid midline-normal, Neck supple, No oral lesions and Tracheostomy not present  Cardiovascular:S1,S2 no murmurs, No rubs or clicks, No bruits, Regular heart beat and No parasternal heave  Breast:N/A  Chest:Symmetrical, No kyphosis and No scoliosis  Respiratory:Clear, No rales/Rhonchi and Percussion and palpation-Normal  Gastrointestinal:Abdomen soft, Abdomen non-tender, Abdomen non-distended, Abdomen without masses, No ascites, No hepatosplenomegaly and No hernia  Hematologic/Lymphatic:No petechiae, No purpura, No neck lymphadenopathy, No Axillary lymphadenopathy and No Groin lymphadenopathy  Musculoskeletal:Normal gait and station, Range of motion normal and Strength/Tone normal  Extremities:No edema, No cyanosis, No digital clubbing and No discoloration  Skin:Negative  Neurologic:Alert and oriented, Alerted orientation/alertness, No hemiplegia, No Hemiparesis, Cranial nerves intact, Paralysis cranial nerves, No sensory deficit and No motor deficits  Rectal Exam:Deferred  Genitourinary:Deferred    Assessment:

## 2017-03-04 NOTE — Progress Notes
?    buPROPion HCl (WELLBUTRIN XL) 300 MG PO Tablet Extended Release 24 Hour, TK 1 T   PO IN THE MORNING ONCE D, Disp: , Rfl: 2  ?  CARAFATE 1 GM/10ML PO Suspension, TK 10 ML PO TID OES 1 HOUR B MEALS AND HS, Disp: , Rfl: 2  ?  diclofenac (VOLTAREN GEL) 1 % TD Gel, APP AA BID UTD, Disp: , Rfl: 0  ?  Diclofenac Sodium CR 100 MG PO Tablet Extended Release 24 Hour, TK 1 T PO ONCE A DAY, Disp: , Rfl: 2  ?  eszopiclone (LUNESTA) 3 MG PO Tablet, , Disp: , Rfl: 2  ?  omeprazole (PriLOSEC) 40 MG PO Capsule Delayed Release, TK 1 C PO QD AC, Disp: , Rfl: 5  ?  ondansetron (ZOFRAN-ODT) 8 MG PO Tablet Disintegrating, Dissolve 1 tablet in mouth every 8 hours as needed for nausea., Disp: 30 tablet, Rfl: 11  ?  prochlorperazine (COMPAZINE) 10 MG PO Tablet, Take 1 tablet by mouth every 6 hours as needed., Disp: 60 tablet, Rfl: 11  ?  SUPREP BOWEL PREP KIT 17.5-3.13-1.6 GM/180ML PO Solution, , Disp: , Rfl: 0  ?  tiZANidine (ZANAFLEX) 4 MG PO Tablet, TK 1 T PO QHS PRN, Disp: , Rfl: 0  ?  traMADol (ULTRAM) 50 MG PO Tablet, TK 1 T PO Q 6 H PRF PAIN, Disp: , Rfl: 0      Labs:   Office Visit on 02/19/2017   Component Date Value   ? WHITE BLOOD CELL COUNT 02/19/2017 9.1    ? RBC 02/19/2017 4.00    ? Hemoglobin 02/19/2017 13.1    ? Hematocrit 02/19/2017 37.4    ? MCV 02/19/2017 93.5    ? [facility] 02/19/2017 32.8    ? MCHC 02/19/2017 35.0    ? RDW 02/19/2017 11.7    ? Platelets 02/19/2017 256    ? MPV 02/19/2017 10.4    ? Neutrophils Absolute 02/19/2017 5041    ? Lymphocytes Absolute 02/19/2017 2967    ? Monocytes Absolute 02/19/2017 919    ? Eosinophils Absolute 02/19/2017 127    ? Basophils Absolute 02/19/2017 46    ? Neutrophils 02/19/2017 55.4    ? LYMPHOCYTES 02/19/2017 32.6    ? MONOCYTES 02/19/2017 10.1    ? EOSINOPHILS 02/19/2017 1.4    ? BASOPHILS 02/19/2017 0.5    ? Glucose 02/19/2017 80    ? Urea Nitrogen 02/19/2017 21    ? Creatinine 02/19/2017 0.73    ? EGFR 02/19/2017 91    ? Glom Filt Rate, Est Afri* 02/19/2017 105

## 2017-03-04 NOTE — Progress Notes
from Feb 05, 2017 to indeterminate subcentimeter low-density lesions were noted within the lateral segment of the left hepatic lobe. These were biopsied by fine-needle aspiration at [facility] and were negative for malignancy. Most recent PET CT scan at Dr. Dario Guardian facility did not confirm liver metastasis.    With today's evaluation also I have requested baseline laboratory testing to include a CBC, CMP, LDH, CEA.      Recommendation/Plan:   I believe patient has stage III stomach cancer and will proceed with preop chemo therapy with FOLFOX regimen hopefully she will achieve complete remission. If she does have a good response she'll be a candidate for curative surgery. Also extensively discussed with him about the toxicities of chemo therapy which may include mild nausea vomiting alopecia myelosuppression, oxaliplatin-induced neuropathy, excessive sensitivity to cold temperature as well as occasional mucositis from 5-fluorouracil. Patient and her sister voiced her good understanding and are anxious to proceed with chemo therapy. I plan to see her back again in 10 days CBC CMP for follow-up  I also left the message with Dr. Robbie Lis to see whether he has sent specimen for Foundation molecular analysis if not I will send to [facility] for the same analysis  I spent more than 40 minutes including 30 minutes counseling time and answered all her questions to their satisfaction      Return to clinic, Test ordered, Chemo ordered FOLFOX, Review/Obtain old records, Diagnosis and Care Plan discussed, Reviewed NCCN Guidelines , Prognostic/predictive markers reviewed, Treatment Goal-Disease Free Survival, Literature review, Risk/Treatment Benefit, Stable/Treatment ordered, Chemo Education, Side effect reviewed, Bone involvement/Bisphosphonates, Reviewed contacting office//after hours contact, Chemotherapy plan reviewed, Restaging Plan, Alternative Options

## 2017-03-04 NOTE — Progress Notes
?   BUN/Creatinine Ratio 50/06/3817 NOT APPLICABLE    ? Sodium 02/19/2017 140    ? Potassium 02/19/2017 4.4    ? Chloride 02/19/2017 104    ? CARBON DIOXIDE 02/19/2017 25    ? Calcium 02/19/2017 9.7    ? Protein, Total 02/19/2017 6.4    ? ALBUMIN 02/19/2017 4.5    ? Globulin 02/19/2017 1.9    ? ALBUMIN/GLOBULIN RATIO 02/19/2017 2.4    ? Total Bilirubin 02/19/2017 0.5    ? Alkaline Phosphatase 02/19/2017 66    ? AST 02/19/2017 14    ? ALT 02/19/2017 11    ? LD 02/19/2017 124    ? CEA 02/19/2017 1.5          Imaging Results:       Physical Exam: This was performed today on 03/04/2017 and indicated:    General: Well developed, well nourished, No acute Distress, Chronically ill-looking, Lady patient and Appears stated age  Head:Atraumatic and normocephalic  Eyes:PERRLA, EOMS intact, No jaundice, pallor and Conjunctiva clear  Ears, Nose, Throat, Neck, and Mouth: Trachea Midline, No JVD, No Lymphadenopathy, Thyroid midline-normal, Neck supple, No oral lesions and Tracheostomy not present  Cardiovascular:S1,S2 no murmurs, No rubs or clicks, No bruits, Regular heart beat and No parasternal heave  Breast:N/A  Chest:Symmetrical, No kyphosis and No scoliosis  Respiratory:Clear, No rales/Rhonchi and Percussion and palpation-Normal  Gastrointestinal:Abdomen soft, Abdomen non-tender, Abdomen non-distended, Abdomen without masses, No ascites, No hepatosplenomegaly and No hernia  Hematologic/Lymphatic:No petechiae, No purpura, No neck lymphadenopathy, No Axillary lymphadenopathy and No Groin lymphadenopathy  Musculoskeletal:Normal gait and station, Range of motion normal and Strength/Tone normal  Extremities:No edema, No cyanosis, No digital clubbing and No discoloration  Skin:Negative  Neurologic:Alert and oriented, Alerted orientation/alertness, No hemiplegia, No Hemiparesis, Cranial nerves intact, Paralysis cranial nerves, No sensory deficit and No motor deficits  Rectal Exam:Deferred  Genitourinary:Deferred    Assessment:

## 2017-03-04 NOTE — Progress Notes
from Feb 05, 2017 to indeterminate subcentimeter low-density lesions were noted within the lateral segment of the left hepatic lobe. These were biopsied by fine-needle aspiration at [facility] and were negative for malignancy. Most recent PET CT scan at Dr. Dario Guardian facility did not confirm liver metastasis.    With today's evaluation also I have requested baseline laboratory testing to include a CBC, CMP, LDH, CEA.    Recommendation/Plan:   Return to clinic, Test ordered, Chemo ordered FOLFOX, Review/Obtain old records, Diagnosis and Care Plan discussed, Reviewed NCCN Guidelines , Prognostic/predictive markers reviewed, Treatment Goal-Disease Free Survival, Literature review, Risk/Treatment Benefit, Stable/Treatment ordered, Chemo Education, Side effect reviewed, Bone involvement/Bisphosphonates, Reviewed contacting office//after hours contact, Chemotherapy plan reviewed, Restaging Plan, Alternative Options discussed, Supportive Care Measures discussed, Dehydration/IV Fluids, Radiology Review, Mediport placement, All questions answered, Counseling/Coordination of Care and Prescription renewals    Return to Clinic:  in 2 weeks.      This document was created using voice recognition software. Inadvertent typographical errors, word omissions and/or word substitutions may exist.

## 2017-03-04 NOTE — Progress Notes
This was repeated today on 03/04/2917 and showed:      Constitutional    The patient does not report any fevers, chills, she has weight loss of 20# since 08/2016, she has malaise/fatigue, no diaphoresis, no major or lateralized weakness.    Skin    There is no systemic rash.  There is no pruritus.    HEENT    There is no hearing loss.  No tinnitus.  There is no ear pain, ear drainage, epistaxis, congestion, sinus pain, stridor, sore throat.    Eyes    There is no blurred vision, double vision, photophobia, eye pain, eye discharge, eye redness.    Cardiovascular    There is no chest pain, palpitations, orthopnea, claudication, leg swelling, PND.    Respiratory     There is no cough, hemoptysis, sputum production, shortness of breath, wheezing.    GI    There is history of heartburn since 08/2016, no nausea, vomiting, there is abdominal pain, post prandial, sharp and occ dull, 5-6/10 lasting for hours post eating, epigastric, fullness feeling, no diarrhea, constipation, no blood in the stool,no melena.    GU     There is no dysuria, urgency, frequency, hematuria, flank pain.    Musculoskeletal    There is no myalgia, neck pain, back pain, joint pain, falls.    Endocrine    There is no polyuria or polydipsia, intolerance to that heat or cold.    Hematology    There is no easy bruising or bleeding.  There is no epistaxis.    Neurological    The patient does not have dizziness, headaches, tingling, tremors, sensory changes, speech change, focal weakness, seizures, recent loss of consciousness.    Psychiatry    Patient did not complain of depression, suicidal ideation, substance abuse, hallucinations, nervousness/anxiety, insomnia, memory loss.        Medications:   Current Outpatient Prescriptions:   ?  ALPRAZolam (XANAX) 0.5 MG PO Tablet, TK 1 T PO QHS, Disp: , Rfl: 2  ?  ARMOUR THYROID 15 MG PO Tablet, TK 3 TS PO ONCE D OES, Disp: , Rfl: 3  ?  bimatoprost (LATISSE) 0.03 % EX Solution, , Disp: , Rfl: 11

## 2017-03-04 NOTE — Progress Notes
No referring provider defined for this encounter.    No chief complaint on file.  The patient has been referred to this clinic for workup and evaluation and treatment of adenocarcinoma of the GE junction.    Patient Name: Mariah Cameron      Date Of Birth: 11-21-1955    History of Present Illness:    Date: 02/19/2017    Ms. 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 The patient started having severe acid reflux syndrome in November 2017 and she ended up having an endoscopy on 01/28/2017 with also endoscopic ultrasound on 02/08/2017. Results of the pathology by biopsy of a fungating mass were discussed as above. According to the patient's report the tumor was HER-2 negative. She was initially evaluated by medical oncologist at [facility] [place] but she preferred to transfer her treatment to this facility and also to the [place] and she has seen Dr. Freddrick March also as of yesterday, who recommended an MRI of the liver as well as neoadjuvant chemotherapy. I do not have results of her PET/CT scan but the patient told me verbally that this was negative for liver lesions, initially described at a recent CT scan of the upper and lower abdomen. There was no other distant metastasis but there was lymphadenopathy associated with the stomach cancer. The patient is scheduled to undergo a Mediport placement by Dr. Dara Lords as of Friday 02/22/2017. She was suggested to have FOLFOX chemotherapy as soon as possible.    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 She had a wellness check in March 2018 and this was completely negative. The

## 2017-03-04 NOTE — Progress Notes
Neurological: No seizures, No loss of balance, No weakness of limbs  Psychiatric: Deferred  Hematologic/Lymphatic/Immunologic: No bleeding, No lumps in arm pits  March 04, 2017, 12 system review done as above    Medications:   Current Outpatient Prescriptions:   ?  ALPRAZolam (XANAX) 0.5 MG PO Tablet, TK 1 T PO QHS, Disp: , Rfl: 2  ?  ARMOUR THYROID 15 MG PO Tablet, TK 3 TS PO ONCE D OES, Disp: , Rfl: 3  ?  bimatoprost (LATISSE) 0.03 % EX Solution, , Disp: , Rfl: 11  ?  buPROPion HCl (WELLBUTRIN XL) 300 MG PO Tablet Extended Release 24 Hour, TK 1 T   PO IN THE MORNING ONCE D, Disp: , Rfl: 2  ?  CARAFATE 1 GM/10ML PO Suspension, TK 10 ML PO TID OES 1 HOUR B MEALS AND HS, Disp: , Rfl: 2  ?  diclofenac (VOLTAREN GEL) 1 % TD Gel, APP AA BID UTD, Disp: , Rfl: 0  ?  Diclofenac Sodium CR 100 MG PO Tablet Extended Release 24 Hour, TK 1 T PO ONCE A DAY, Disp: , Rfl: 2  ?  eszopiclone (LUNESTA) 3 MG PO Tablet, , Disp: , Rfl: 2  ?  omeprazole (PriLOSEC) 40 MG PO Capsule Delayed Release, TK 1 C PO QD AC, Disp: , Rfl: 5  ?  ondansetron (ZOFRAN-ODT) 8 MG PO Tablet Disintegrating, Dissolve 1 tablet in mouth every 8 hours as needed for nausea., Disp: 30 tablet, Rfl: 11  ?  prochlorperazine (COMPAZINE) 10 MG PO Tablet, Take 1 tablet by mouth every 6 hours as needed., Disp: 60 tablet, Rfl: 11  ?  SUPREP BOWEL PREP KIT 17.5-3.13-1.6 GM/180ML PO Solution, , Disp: , Rfl: 0  ?  tiZANidine (ZANAFLEX) 4 MG PO Tablet, TK 1 T PO QHS PRN, Disp: , Rfl: 0  ?  traMADol (ULTRAM) 50 MG PO Tablet, TK 1 T PO Q 6 H PRF PAIN, Disp: , Rfl: 0      Labs:   Office Visit on 02/19/2017   Component Date Value   ? WHITE BLOOD CELL COUNT 02/19/2017 9.1    ? RBC 02/19/2017 4.00    ? Hemoglobin 02/19/2017 13.1    ? Hematocrit 02/19/2017 37.4    ? MCV 02/19/2017 93.5    ? [facility] 02/19/2017 32.8    ? MCHC 02/19/2017 35.0    ? RDW 02/19/2017 11.7    ? Platelets 02/19/2017 256    ? MPV 02/19/2017 10.4    ? Neutrophils Absolute 02/19/2017 5041

## 2017-03-04 NOTE — Progress Notes
discussed, Supportive Care Measures discussed, Dehydration/IV Fluids, Radiology Review, Mediport placement, All questions answered, Counseling/Coordination of Care and Prescription renewals    Return to Clinic:  in 2 weeks.  Dr. Rosaura Carpenter,  Dr. Carmelina Paddock, Dr. Freddrick March    This document was created using voice recognition software. Inadvertent typographical errors, word omissions and/or word substitutions may exist.

## 2017-03-04 NOTE — Progress Notes
Mariah Cameron to see whether she had molecular analysis sent  Interim medical history, surgical history, family history, social history were reviewed, no major change from last visit June 2018        Past Surgical History:   Past Surgical History:   Procedure Laterality Date   ? BREAST RECONSTRUCTION  2016   ? CATARACT EXTRACTION W/ INTRAOCULAR LENS IMPLANT     ? COLONOSCOPY W/ BIOPSY  01/28/2017   ? EGD W/ BIOPSY  01/28/2017   ? EUS UPPER W/ FNA  02/08/2017   ? HYSTERECTOMY  2003    uterine fibroids   ? LASIK     ? MASTECTOMY  2011       Social History:    Social History     Social History   ? Marital status: Single     Spouse name: N/A   ? Number of children: 0   ? Years of education: N/A     Occupational History   ? Not on file.     Social History Main Topics   ? Smoking status: Former Smoker     Packs/day: 1.00     Years: 15.00     Types: Cigarettes     Start date: 06/24/1974     Quit date: 11/29/1988   ? Smokeless tobacco: Never Used   ? Alcohol use Yes   ? Drug use: Unknown   ? Sexual activity: Not on file     Other Topics Concern   ? Not on file     Social History Narrative   ? No narrative on file       Family History:    Family History   Problem Relation Age of Onset   ? Breast Cancer Mother    ? Prostate Cancer Father    ? Colon Cancer Father        Vital Signs: There were no vitals filed for this visit.  There were no vitals filed for this visit.    Allergies: Patient has no known allergies.    Review of Systems:    Constitutional: Moderate weight loss, moderate fatigue, No fever  Eyes: No double vision  Ears,Nose, Mouth,Throat: No sinus trouble, No sore throat  Cardiac: No lightheadedness, No swelling in legs, No chest pain  Respiratory: No cough, No hemoptysis, No shortness of breath  Gastrointestinal: No nausea, No vomiting, No diarrhea, No abdominal pain. Mild dysphagia  Genitourinary: No burning on urination  Musculosketal: No bone pain.   Skin: No skin rash

## 2017-03-04 NOTE — Progress Notes
patient also has history of breast cancer in 2010 and she is status post right mastectomy with breast reconstruction. She did not receive chemotherapy but she was treated with Herceptin for 50 months by an oncologist in [place]. According to her report she could not tolerate tamoxifen. In 2016 she had an episode of vertigo and CT scan of the head showed dural thickness.    The patient has never noticed blood in the stool or in the urine, she never had the yellow jaundice, she never had hepatitis or blood transfusions. She has lost approximately 20 pounds since December 2017. She has difficulties with nutrition especially with solid food, as she has to cut the food in small pieces and chew for long time and she always gets pain postprandially.    The patient used to be a smoker for 10 years but she quit in 1989 and she smoked approximately one pack per day. She used to drink wine.    Regarding her family history her father has dementia and heart disease. Her mother had breast cancer at the age of 50 her great-grandmother also had breast cancer. There is no ovarian cancer in the family. Her father has history of prostate cancer and colon cancer. Her paternal grandmother had also colon cancer.    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 The patient started having severe acid reflux syndrome in November 2017 and she ended up having an endoscopy on 01/28/2017 with also endoscopic ultrasound on 02/08/2017. Results of the pathology by biopsy of a fungating mass were discussed as above. According to the patient's report the tumor was HER-2 negative. She was initially evaluated by medical oncologist at [facility] but

## 2017-03-06 ENCOUNTER — Inpatient Hospital Stay: Admit: 2017-03-06 | Discharge: 2017-03-07 | Primary: Internal Medicine

## 2017-03-06 DIAGNOSIS — C169 Malignant neoplasm of stomach, unspecified: Principal | ICD-10-CM

## 2017-03-06 MED ORDER — PALONOSETRON HCL 0.25 MG/5ML IV SOLN
.25 mg | Freq: Once | INTRAVENOUS | Status: CP
Start: 2017-03-06 — End: ?

## 2017-03-06 MED ORDER — DEXAMETHASONE IVPB BCN JX
4 mg | Freq: Once | INTRAVENOUS | Status: CP
Start: 2017-03-06 — End: ?

## 2017-03-06 MED ORDER — ONDANSETRON IVPB BCN JX
8 mg | Freq: Once | INTRAVENOUS | Status: CN
Start: 2017-03-06 — End: ?

## 2017-03-06 MED ORDER — SODIUM CHLORIDE 0.9 % IV SOLN
Freq: Once | INTRAVENOUS | Status: CP
Start: 2017-03-06 — End: ?

## 2017-03-06 MED ORDER — SODIUM CHLORIDE FLUSH 0.9 % IV SOLN
10 mL | Status: CN | PRN
Start: 2017-03-06 — End: ?

## 2017-03-06 MED ORDER — PROMETHAZINE IN NS IVPB JX
12.5 mg | Freq: Once | INTRAVENOUS | Status: CN
Start: 2017-03-06 — End: ?

## 2017-03-06 MED ORDER — DIPHENHYDRAMINE IVPB JX
25 mg | Freq: Once | INTRAVENOUS | Status: CN
Start: 2017-03-06 — End: ?

## 2017-03-06 MED ORDER — HEPARIN SODIUM LOCK FLUSH 100 UNIT/ML IV SOLN
500 [IU] | Status: DC | PRN
Start: 2017-03-06 — End: 2017-03-07

## 2017-03-06 MED ORDER — HEPARIN SODIUM LOCK FLUSH 100 UNIT/ML IV SOLN
500 [IU] | Status: CN | PRN
Start: 2017-03-06 — End: ?

## 2017-03-06 MED ORDER — LORAZEPAM 2 MG/ML IJ SOLN
.5 mg | Freq: Once | INTRAVENOUS | Status: CP
Start: 2017-03-06 — End: ?

## 2017-03-07 ENCOUNTER — Ambulatory Visit: Admit: 2017-03-07 | Discharge: 2017-03-08 | Attending: Hematology & Oncology | Primary: Internal Medicine

## 2017-03-07 ENCOUNTER — Inpatient Hospital Stay: Admit: 2017-03-07 | Discharge: 2017-03-08 | Primary: Internal Medicine

## 2017-03-07 DIAGNOSIS — R9389 Abnormal findings on diagnostic imaging of other specified body structures: Secondary | ICD-10-CM

## 2017-03-07 DIAGNOSIS — F419 Anxiety disorder, unspecified: Secondary | ICD-10-CM

## 2017-03-07 DIAGNOSIS — C50911 Malignant neoplasm of unspecified site of right female breast: Secondary | ICD-10-CM

## 2017-03-07 DIAGNOSIS — Z79891 Long term (current) use of opiate analgesic: Secondary | ICD-10-CM

## 2017-03-07 DIAGNOSIS — Z9010 Acquired absence of unspecified breast and nipple: Secondary | ICD-10-CM

## 2017-03-07 DIAGNOSIS — Z87891 Personal history of nicotine dependence: Secondary | ICD-10-CM

## 2017-03-07 DIAGNOSIS — Z90710 Acquired absence of both cervix and uterus: Secondary | ICD-10-CM

## 2017-03-07 DIAGNOSIS — C169 Malignant neoplasm of stomach, unspecified: Secondary | ICD-10-CM

## 2017-03-07 DIAGNOSIS — Z800 Family history of malignant neoplasm of digestive organs: Secondary | ICD-10-CM

## 2017-03-07 DIAGNOSIS — R1013 Epigastric pain: Secondary | ICD-10-CM

## 2017-03-07 DIAGNOSIS — C160 Malignant neoplasm of cardia: Principal | ICD-10-CM

## 2017-03-07 DIAGNOSIS — K769 Liver disease, unspecified: Secondary | ICD-10-CM

## 2017-03-07 DIAGNOSIS — M1990 Unspecified osteoarthritis, unspecified site: Secondary | ICD-10-CM

## 2017-03-07 DIAGNOSIS — Z79899 Other long term (current) drug therapy: Secondary | ICD-10-CM

## 2017-03-07 DIAGNOSIS — R1310 Dysphagia, unspecified: Secondary | ICD-10-CM

## 2017-03-07 DIAGNOSIS — Z803 Family history of malignant neoplasm of breast: Secondary | ICD-10-CM

## 2017-03-07 DIAGNOSIS — E039 Hypothyroidism, unspecified: Secondary | ICD-10-CM

## 2017-03-07 DIAGNOSIS — K219 Gastro-esophageal reflux disease without esophagitis: Principal | ICD-10-CM

## 2017-03-07 DIAGNOSIS — D219 Benign neoplasm of connective and other soft tissue, unspecified: Secondary | ICD-10-CM

## 2017-03-07 DIAGNOSIS — R634 Abnormal weight loss: Secondary | ICD-10-CM

## 2017-03-07 MED ORDER — SODIUM CHLORIDE 0.9 % IV SOLN
Freq: Once | INTRAVENOUS | Status: CP
Start: 2017-03-07 — End: ?

## 2017-03-07 MED ORDER — DEXAMETHASONE IVPB BCN JX
4 mg | Freq: Once | INTRAVENOUS | Status: DC
Start: 2017-03-07 — End: 2017-03-07

## 2017-03-07 MED ORDER — ONDANSETRON WITH DEXAMETHASONE IVPB (NON-SPECIFIED)
Freq: Once | INTRAVENOUS | Status: CP
Start: 2017-03-07 — End: ?

## 2017-03-07 MED ORDER — ONDANSETRON IVPB BCN JX
8 mg | Freq: Once | INTRAVENOUS | Status: DC
Start: 2017-03-07 — End: 2017-03-07

## 2017-03-07 MED ORDER — HEPARIN SODIUM LOCK FLUSH 100 UNIT/ML IV SOLN
500 [IU] | Status: DC | PRN
Start: 2017-03-07 — End: 2017-03-08

## 2017-03-07 MED ORDER — LORAZEPAM 2 MG/ML IJ SOLN
.5 mg | Freq: Once | INTRAVENOUS | Status: CP
Start: 2017-03-07 — End: ?

## 2017-03-07 NOTE — Progress Notes
discussed, Supportive Care Measures discussed, Dehydration/IV Fluids, Radiology Review, Mediport placement, All questions answered, Counseling/Coordination of Care and Prescription renewals    Return to Clinic:  in 2 weeks.  Dr. Rosaura Carpenter,  Dr. Carmelina Paddock, Dr. Freddrick March    This document was created using voice recognition software. Inadvertent typographical errors, word omissions and/or word substitutions may exist.

## 2017-03-07 NOTE — Progress Notes
Vital Signs: There were no vitals filed for this visit.  There were no vitals filed for this visit.    Allergies: Patient has no known allergies.    Review of Systems:    Constitutional: Moderate weight loss, moderate fatigue, No fever  Eyes: No double vision  Ears,Nose, Mouth,Throat: No sinus trouble, No sore throat  Cardiac: No lightheadedness, No swelling in legs, No chest pain  Respiratory: No cough, No hemoptysis, No shortness of breath  Gastrointestinal: Increasing nausea vomiting  No diarrhea, No abdominal pain. Mild dysphagia  Genitourinary: No burning on urination  Musculosketal: No bone pain.   Skin: No skin rash  Neurological: No seizures, No loss of balance, No weakness of limbs  Psychiatric: Deferred  Hematologic/Lymphatic/Immunologic: No bleeding, No lumps in arm pits  March 07, 2017, 12 system review done as above    Medications:   Current Outpatient Prescriptions:   ?  ALPRAZolam (XANAX) 0.5 MG PO Tablet, TK 1 T PO QHS, Disp: , Rfl: 2  ?  ARMOUR THYROID 15 MG PO Tablet, TK 3 TS PO ONCE D OES, Disp: , Rfl: 3  ?  bimatoprost (LATISSE) 0.03 % EX Solution, , Disp: , Rfl: 11  ?  buPROPion HCl (WELLBUTRIN XL) 300 MG PO Tablet Extended Release 24 Hour, TK 1 T   PO IN THE MORNING ONCE D, Disp: , Rfl: 2  ?  CARAFATE 1 GM/10ML PO Suspension, TK 10 ML PO TID OES 1 HOUR B MEALS AND HS, Disp: , Rfl: 2  ?  diclofenac (VOLTAREN GEL) 1 % TD Gel, APP AA BID UTD, Disp: , Rfl: 0  ?  Diclofenac Sodium CR 100 MG PO Tablet Extended Release 24 Hour, TK 1 T PO ONCE A DAY, Disp: , Rfl: 2  ?  DOK 100 MG PO Capsule, TK ONE C PO BID WITH PLENTY OF WATER, Disp: , Rfl: 0  ?  eszopiclone (LUNESTA) 3 MG PO Tablet, , Disp: , Rfl: 2  ?  HYDROcodone-acetaminophen (NORCO) 5-325 MG PO Tablet, TK 1-2 TS PO Q 6 H, Disp: , Rfl: 0  ?  omeprazole (PriLOSEC) 40 MG PO Capsule Delayed Release, TK 1 C PO QD AC, Disp: , Rfl: 5  ?  ondansetron (ZOFRAN-ODT) 8 MG PO Tablet Disintegrating, Dissolve 1

## 2017-03-07 NOTE — Progress Notes
myelosuppression, oxaliplatin-induced neuropathy, excessive sensitivity to cold temperature as well as occasional mucositis from 5-fluorouracil. Patient and her sister voiced her good understanding and are anxious to proceed with chemo therapy. I plan to see her back again in 10 days CBC CMP for follow-up  I also left the message with Dr. Robbie Lis to see whether he has sent specimen for [facility] molecular analysis if not I will send to [facility] for the same analysis  I spent more than 40 minutes including 30 minutes counseling time and answered all her questions to their satisfaction  March 07, 2017  This patient has persistent nausea vomiting and also has some obstructive symptoms from GE junction tumor, I have discussed the case with Dr. Freddrick March was suggested to have a jejunostomy tube placed for nutritional purposes. Dr. Freddrick March also talked to the patient to phone regarding the need of jejunostomy tube. Patient understand the situation and she is willing to proceed with the jejunostomy tube. Dr. Vilinda Blanks will make appropriate arrangements. I will give IV fluid today and tomorrow and advised to drink protein drinks as much she can. I also twice her that in today to have nausea vomiting she may need to go to the emergency room in May needs to be admitted to the hospital for jejunostomy tube and IV fluids. I plan to see her back again next week as planned before, I spent more than 35 minutes including 25 minutes counseling time and also discuss with Dr. Freddrick March      Return to clinic, Test ordered, Chemo ordered FOLFOX, Review/Obtain old records, Diagnosis and Care Plan discussed, Reviewed NCCN Guidelines , Prognostic/predictive markers reviewed, Treatment Goal-Disease Free Survival, Literature review, Risk/Treatment Benefit, Stable/Treatment ordered, Chemo Education, Side effect reviewed, Bone involvement/Bisphosphonates, Reviewed contacting office//after hours contact, Chemotherapy plan reviewed, Restaging Plan, Alternative Options

## 2017-03-07 NOTE — Progress Notes
?   LD 02/19/2017 124    ? CEA 02/19/2017 1.5      02/19/2017 CBC, CMP grossly normal, CEA 1.5, LDH 124    5/4/2018A. 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 SEE COMMENT    B. ANTRUM (ENDOSCOPIC BIOPSY):   -  MILD CHRONIC GASTRITIS WITH MINIMAL ACTIVITY.   -  NO HELICOBACTER PYLORI IDENTIFIED BY MORPHOLOGICAL EVALUATION.   -  NO INTESTINAL METAPLASIA, DYSPLASIA OR MALIGNANCY IDENTIFIED.    C. POLYP DESCENDING (ENDOSCOPIC BIOPSY):   -  TUBULAR ADENOMA   -  NEGATIVE FOR HIGH-GRADE DYSPLASIA AND MALIGNANCY  HER2 Non-Breast: NEGATIVE  Score: 1+    02/08/2017. LIVER (FINE NEEDLE ASPIRATION):   -  NO EVIDENCE OF MALIGNANCY IDENTIFIED.    -  SMALL DETACHED FRAGMENTS OF LIVER PARENCHYMA AND SUPERFICIAL GASTRIC        FOVEOLAR EPITHELIUM.    Imaging Results:     02/14/2017, PET/CT scan showed FDG uptake in the anterior wall of the antrum consistent with malignancy, 1.87 with a lymph node within the stomach and liver, no obvious liver metastasis  02/21/2017, MRI of the abdomen there is 4-5 mm nonenhancing lesion noted within the left lobe of the liver which were present and atypical cyst or small hemangioma no obvious additional lesions are seen within the liver, there is a mass within the stomach consistent with patient's history of stomach cancer  I have reviewed these reports with the patient today March 04, 2017  EUS, staged as T3 N3, M1 although biopsy of the liver negative for malignancy as his MRI and the PET scan    Physical Exam: This was performed today on 03/04/2017 and indicated:    General: Well developed, well nourished, No acute Distress, Chronically ill-looking, Lady patient and Appears stated age  Head:Atraumatic and normocephalic  Eyes:PERRLA, EOMS intact, No jaundice, pallor and Conjunctiva clear, Tongue is dry, slightly dehydrated   Ears, Nose, Throat, Neck, and Mouth: Trachea Midline, No JVD, No

## 2017-03-07 NOTE — Progress Notes
Lymphadenopathy, Thyroid midline-normal, Neck supple, No oral lesions and Tracheostomy not present  Cardiovascular:S1,S2 no murmurs, No rubs or clicks, No bruits, Regular heart beat and No parasternal heave  Breast:N/A  Chest:Symmetrical, No kyphosis and No scoliosis  Respiratory:Clear, No rales/Rhonchi and Percussion and palpation-Normal  Gastrointestinal:Abdomen soft, Abdomen non-tender, Abdomen non-distended, Abdomen without masses, No ascites, No hepatosplenomegaly and No hernia  Hematologic/Lymphatic:No petechiae, No purpura, No neck lymphadenopathy, No Axillary lymphadenopathy and No Groin lymphadenopathy  Musculoskeletal:Normal gait and station, Range of motion normal and Strength/Tone normal  Extremities:No edema, No cyanosis, No digital clubbing and No discoloration  Skin:Negative  Neurologic:Alert and oriented, Alerted orientation/alertness, No hemiplegia, No Hemiparesis, Cranial nerves intact, Paralysis cranial nerves, No sensory deficit and No motor deficits  Rectal Exam:Deferred  Genitourinary:Deferred    Assessment:    [name] with 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 The patient has a Mediport placed as of this Friday, 02/22/2017. I discussed today with her patient and her sister regarding her diagnosis, prognosis and treatments for this type of tumor. I explained in great detail the effects and side effects of chemotherapy especially neuropathy with oxaliplatin possible chest pain or discomfort with 5-FU, less of alopecia and no major other side effects. In general this tablet treatment is relatively well tolerated without excessive cytopenias. I prescribed antinausea medications in case of chemotherapy associated nausea and vomiting, to her local pharmacy. We did

## 2017-03-07 NOTE — Progress Notes
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 She had a wellness check in March 2018 and this was completely negative. The patient also has history of breast cancer in 2010 and she is status post right mastectomy with breast reconstruction. She did not receive chemotherapy but she was treated with Herceptin for 50 months by an oncologist in [place]. According to her report she could not tolerate tamoxifen. In 2016 she had an episode of vertigo and CT scan of the head showed dural thickness.    The patient has never noticed blood in the stool or in the urine, she never had the yellow jaundice, she never had hepatitis or blood transfusions. She has lost approximately 20 pounds since December 2017. She has difficulties with nutrition especially with solid food, as she has to cut the food in small pieces and chew for long time and she always gets pain postprandially.    The patient used to be a smoker for 10 years but she quit in 1989 and she smoked approximately one pack per day. She used to drink wine.    Regarding her family history her father has dementia and heart disease. Her mother had breast cancer at the age of 20 her great-grandmother also had breast cancer. There is no ovarian cancer in the family. Her father has history of prostate cancer and colon cancer. Her paternal grandmother had also colon cancer.    03/04/2017    Ms. 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 The patient started having severe acid reflux syndrome in November 2017 and she ended up having an endoscopy on 01/28/2017 with also endoscopic ultrasound on 02/08/2017. Results of the pathology by biopsy of a fungating mass were discussed as above. According to the patient's report the tumor was HER-2 negative. She was initially

## 2017-03-07 NOTE — Patient Instructions
IV fluids tomorrow

## 2017-03-07 NOTE — Progress Notes
tablet in mouth every 8 hours as needed for nausea., Disp: 30 tablet, Rfl: 11  ?  prochlorperazine (COMPAZINE) 10 MG PO Tablet, Take 1 tablet by mouth every 6 hours as needed., Disp: 60 tablet, Rfl: 11  ?  tiZANidine (ZANAFLEX) 4 MG PO Tablet, TK 1 T PO QHS PRN, Disp: , Rfl: 0  ?  traMADol (ULTRAM) 50 MG PO Tablet, TK 1 T PO Q 6 H PRF PAIN, Disp: , Rfl: 0  No current facility-administered medications for this visit.     Facility-Administered Medications Ordered in Other Visits:   ?  heparin 100 UNIT/ML flush 500 Units, 500 Units, Intercatheter, PRN, Rogene Houston, MD, 500 Units at 03/07/17 1450      Labs:   Office Visit on 02/19/2017   Component Date Value   ? WHITE BLOOD CELL COUNT 02/19/2017 9.1    ? RBC 02/19/2017 4.00    ? Hemoglobin 02/19/2017 13.1    ? Hematocrit 02/19/2017 37.4    ? MCV 02/19/2017 93.5    ? [facility] 02/19/2017 32.8    ? MCHC 02/19/2017 35.0    ? RDW 02/19/2017 11.7    ? Platelets 02/19/2017 256    ? MPV 02/19/2017 10.4    ? Neutrophils Absolute 02/19/2017 5041    ? Lymphocytes Absolute 02/19/2017 2967    ? Monocytes Absolute 02/19/2017 919    ? Eosinophils Absolute 02/19/2017 127    ? Basophils Absolute 02/19/2017 46    ? Neutrophils 02/19/2017 55.4    ? LYMPHOCYTES 02/19/2017 32.6    ? MONOCYTES 02/19/2017 10.1    ? EOSINOPHILS 02/19/2017 1.4    ? BASOPHILS 02/19/2017 0.5    ? Glucose 02/19/2017 80    ? Urea Nitrogen 02/19/2017 21    ? Creatinine 02/19/2017 0.73    ? EGFR 02/19/2017 91    ? Glom Filt Rate, Est Afri* 02/19/2017 105    ? BUN/Creatinine Ratio 22/29/7989 NOT APPLICABLE    ? Sodium 02/19/2017 140    ? Potassium 02/19/2017 4.4    ? Chloride 02/19/2017 104    ? CARBON DIOXIDE 02/19/2017 25    ? Calcium 02/19/2017 9.7    ? Protein, Total 02/19/2017 6.4    ? ALBUMIN 02/19/2017 4.5    ? Globulin 02/19/2017 1.9    ? ALBUMIN/GLOBULIN RATIO 02/19/2017 2.4    ? Total Bilirubin 02/19/2017 0.5    ? Alkaline Phosphatase 02/19/2017 66    ? AST 02/19/2017 14    ? ALT 02/19/2017 11

## 2017-03-07 NOTE — Progress Notes
Chauncey Reading, MD  [address]  [place] [id number]  [facility] [place] [address]    Chief Complaint   Patient presents with   ? Follow-up     GASTRIC ADENOCARCINOMA   The patient has been referred to this clinic for workup and evaluation and treatment of adenocarcinoma of the GE junction.    Patient Name: Mariah Cameron      Date Of Birth: 28-Apr-1956    History of Present Illness:    Date: 02/19/2017    Ms. 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 The patient started having severe acid reflux syndrome in November 2017 and she ended up having an endoscopy on 01/28/2017 with also endoscopic ultrasound on 02/08/2017. Results of the pathology by biopsy of a fungating mass were discussed as above. According to the patient's report the tumor was HER-2 negative. She was initially evaluated by medical oncologist at [facility] [place] but she preferred to transfer her treatment to this facility and also to the [place] and she has seen Dr. Freddrick March also as of yesterday, who recommended an MRI of the liver as well as neoadjuvant chemotherapy. I do not have results of her PET/CT scan but the patient told me verbally that this was negative for liver lesions, initially described at a recent CT scan of the upper and lower abdomen. There was no other distant metastasis but there was lymphadenopathy associated with the stomach cancer. The patient is scheduled to undergo a Mediport placement by Dr. Dara Lords as of Friday 02/22/2017. She was suggested to have FOLFOX chemotherapy as soon as possible.    Her past medical history significant for osteoporosis as well as

## 2017-03-07 NOTE — Progress Notes
evaluated by medical oncologist at [facility] but she preferred to transfer her treatment to this facility and also to the [place] [facility] and she has seen Dr. Awad also as of yesterday, who recommended an MRI of the liver as well as neoadjuvant chemotherapy. I do not have results of her PET/CT scan but the patient told me verbally that this was negative for liver lesions, initially described at a recent CT scan of the upper and lower abdomen. There was no other distant metastasis but there was lymphadenopathy associated with the stomach cancer. The patient is scheduled to undergo a Mediport placement by Dr. Perkins as of Friday 02/22/2017. She was suggested to have FOLFOX chemotherapy as soon as possible.  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 Patient couldn't tolerate tamoxifen, which has been discontinued. Patient also BRCA 1 and 2 negative in the past  Patient is here for the first course of chemotherapy with FOLFOX. She has many questions about her disease process as well as the biopsy results and also MRI results. I have discussed this in length with the patient and felt that currently she is only as stage III disease by MRI. And as per Dr. Awad, surgeons recommendation I will proceed with FOLFOX chemotherapy at this time. Patient still has mild dysphagia but able to bring labs of protein supplements. Denies any fever or chills cough or chest pain or

## 2017-03-07 NOTE — Progress Notes
add toward with the patient and the sister of our chemotherapy unit and introduce her to our chemotherapy nurses. I have prescribed chemotherapy to start tentatively on Wednesday, Feb 27, 2017.    Given her heavy family history of breast cancer, colon cancer, prostate cancer, I have requested a genetic evaluation testing by myriad laboratories. At [facility] [place] testing was requested and results need to be followed for molecular biology of the tumor to include MSI status or other clinically relevant molecular markers.    The patient will return to this clinic for follow-up, most probable with Dr. Lurena Joiner, in 2 weeks after her first round of chemotherapy to assess for toxicity and possible side effects.    The patient and her sister had a long list of questions regarding diagnosis, treatment, sequencing of multi modality treatments, side effects of chemotherapy and this questions were answered to the best of my knowledge during this initial evaluation.    It is of note is that the knee CT scan of the chest abdomen and pelvis from Feb 05, 2017 to indeterminate subcentimeter low-density lesions were noted within the lateral segment of the left hepatic lobe. These were biopsied by fine-needle aspiration at [facility] and were negative for malignancy. Most recent PET CT scan at Dr. Dario Guardian facility did not confirm liver metastasis.    With today's evaluation also I have requested baseline laboratory testing to include a CBC, CMP, LDH, CEA.      Recommendation/Plan:   I believe patient has stage III stomach cancer and will proceed with preop chemo therapy with FOLFOX regimen hopefully she will achieve complete remission. If she does have a good response she'll be a candidate for curative surgery. Also extensively discussed with him about the toxicities of chemo therapy which may include mild nausea vomiting alopecia

## 2017-03-07 NOTE — Progress Notes
shortness of breath. I left a message with Dr. Robbie Lis, oncologist from 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  Patient is here complains of increasing nausea and vomiting, not able to keep anything down., She is having obstructive symptoms., She had fluids yesterday as well. Denies any fever or chills or cough or chest pain feeling very tired and fatigued lost 2-3 pounds last few days, I have discussed with Dr. Freddrick March recommend  jejunostomy tube for feeding And will be done next few days        Past Surgical History:   Past Surgical History:   Procedure Laterality Date   ? BREAST RECONSTRUCTION  2016   ? CATARACT EXTRACTION W/ INTRAOCULAR LENS IMPLANT     ? COLONOSCOPY W/ BIOPSY  01/28/2017   ? EGD W/ BIOPSY  01/28/2017   ? EUS UPPER W/ FNA  02/08/2017   ? HYSTERECTOMY  2003    uterine fibroids   ? LASIK     ? MASTECTOMY  2011       Social History:    Social History     Social History   ? Marital status: Single     Spouse name: N/A   ? Number of children: 0   ? Years of education: N/A     Occupational History   ? Not on file.     Social History Main Topics   ? Smoking status: Former Smoker     Packs/day: 1.00     Years: 15.00     Types: Cigarettes     Start date: 06/24/1974     Quit date: 11/29/1988   ? Smokeless tobacco: Never Used   ? Alcohol use Yes   ? Drug use: Unknown   ? Sexual activity: Not on file     Other Topics Concern   ? Not on file     Social History Narrative   ? No narrative on file       Family History:    Family History   Problem Relation Age of Onset   ? Breast Cancer Mother    ? Prostate Cancer Father    ? Colon Cancer Father

## 2017-03-08 ENCOUNTER — Inpatient Hospital Stay: Admit: 2017-03-08 | Discharge: 2017-03-09 | Primary: Internal Medicine

## 2017-03-08 DIAGNOSIS — C169 Malignant neoplasm of stomach, unspecified: Principal | ICD-10-CM

## 2017-03-08 MED ORDER — SODIUM CHLORIDE 0.9 % IV SOLN
Freq: Once | INTRAVENOUS | Status: CP
Start: 2017-03-08 — End: ?

## 2017-03-08 MED ORDER — DEXAMETHASONE IVPB BCN JX
4 mg | Freq: Once | INTRAVENOUS | Status: DC
Start: 2017-03-08 — End: 2017-03-08

## 2017-03-08 MED ORDER — HEPARIN SODIUM LOCK FLUSH 100 UNIT/ML IV SOLN
500 [IU] | Status: DC | PRN
Start: 2017-03-08 — End: 2017-03-09

## 2017-03-08 MED ORDER — ONDANSETRON WITH DEXAMETHASONE IVPB (NON-SPECIFIED)
Freq: Once | INTRAVENOUS | Status: CP
Start: 2017-03-08 — End: ?

## 2017-03-08 MED ORDER — LORAZEPAM 2 MG/ML IJ SOLN
.5 mg | Freq: Once | INTRAVENOUS | Status: CP
Start: 2017-03-08 — End: ?

## 2017-03-08 MED ORDER — ONDANSETRON IVPB BCN JX
8 mg | Freq: Once | INTRAVENOUS | Status: DC
Start: 2017-03-08 — End: 2017-03-08

## 2017-03-08 NOTE — Progress Notes
Patient slightly better today. Did drink a protein smoothie this morning and has kept it down. Still appears anxious about not being able to take oral nutrition and fluids. Reviewed with her a plan to help reduce her anxiety prior to eating by taking a Xanax 0.5mg  prior to eating then compazine/Zofran.

## 2017-03-11 ENCOUNTER — Inpatient Hospital Stay: Admit: 2017-03-11 | Discharge: 2017-03-12 | Primary: Internal Medicine

## 2017-03-11 DIAGNOSIS — E43 Unspecified severe protein-calorie malnutrition: Principal | ICD-10-CM

## 2017-03-11 DIAGNOSIS — Z934 Other artificial openings of gastrointestinal tract status: Secondary | ICD-10-CM

## 2017-03-11 DIAGNOSIS — C169 Malignant neoplasm of stomach, unspecified: Principal | ICD-10-CM

## 2017-03-11 DIAGNOSIS — R634 Abnormal weight loss: Secondary | ICD-10-CM

## 2017-03-11 DIAGNOSIS — C160 Malignant neoplasm of cardia: Secondary | ICD-10-CM

## 2017-03-11 DIAGNOSIS — Z01818 Encounter for other preprocedural examination: Secondary | ICD-10-CM

## 2017-03-11 MED ORDER — CEFAZOLIN SODIUM 1 G IJ SOLR
2 g | Freq: Once | INTRAVENOUS | Status: CN
Start: 2017-03-11 — End: ?

## 2017-03-11 MED ORDER — DEXAMETHASONE IVPB BCN JX
4 mg | Freq: Once | INTRAVENOUS | Status: CP
Start: 2017-03-11 — End: ?

## 2017-03-11 MED ORDER — PALONOSETRON HCL 0.25 MG/5ML IV SOLN
.25 mg | Freq: Once | INTRAVENOUS | Status: CP
Start: 2017-03-11 — End: ?

## 2017-03-11 MED ORDER — SODIUM CHLORIDE 0.9 % IV SOLN
Freq: Once | INTRAVENOUS | Status: CP
Start: 2017-03-11 — End: ?

## 2017-03-11 MED ORDER — HEPARIN SODIUM LOCK FLUSH 100 UNIT/ML IV SOLN
500 [IU] | Status: DC | PRN
Start: 2017-03-11 — End: 2017-03-12

## 2017-03-11 MED ORDER — LORAZEPAM 2 MG/ML IJ SOLN
.5 mg | Freq: Once | INTRAVENOUS | Status: CP
Start: 2017-03-11 — End: ?

## 2017-03-11 MED ORDER — HEPARIN SODIUM (PORCINE) 5000 UNIT/ML IJ SOLN
5000 [IU] | Freq: Once | SUBCUTANEOUS | Status: CN
Start: 2017-03-11 — End: ?

## 2017-03-12 ENCOUNTER — Ambulatory Visit: Primary: Internal Medicine

## 2017-03-12 ENCOUNTER — Inpatient Hospital Stay: Admit: 2017-03-12 | Discharge: 2017-03-13 | Primary: Internal Medicine

## 2017-03-12 DIAGNOSIS — C160 Malignant neoplasm of cardia: Secondary | ICD-10-CM

## 2017-03-12 DIAGNOSIS — Z9889 Other specified postprocedural states: Principal | ICD-10-CM

## 2017-03-12 DIAGNOSIS — R1310 Dysphagia, unspecified: Secondary | ICD-10-CM

## 2017-03-12 DIAGNOSIS — E43 Unspecified severe protein-calorie malnutrition: Principal | ICD-10-CM

## 2017-03-12 DIAGNOSIS — R634 Abnormal weight loss: Secondary | ICD-10-CM

## 2017-03-12 DIAGNOSIS — M1990 Unspecified osteoarthritis, unspecified site: Secondary | ICD-10-CM

## 2017-03-12 DIAGNOSIS — K769 Liver disease, unspecified: Secondary | ICD-10-CM

## 2017-03-12 DIAGNOSIS — F419 Anxiety disorder, unspecified: Secondary | ICD-10-CM

## 2017-03-12 DIAGNOSIS — C50911 Malignant neoplasm of unspecified site of right female breast: Secondary | ICD-10-CM

## 2017-03-12 DIAGNOSIS — K219 Gastro-esophageal reflux disease without esophagitis: Principal | ICD-10-CM

## 2017-03-12 DIAGNOSIS — D219 Benign neoplasm of connective and other soft tissue, unspecified: Secondary | ICD-10-CM

## 2017-03-12 DIAGNOSIS — R9389 Abnormal findings on diagnostic imaging of other specified body structures: Secondary | ICD-10-CM

## 2017-03-12 DIAGNOSIS — C169 Malignant neoplasm of stomach, unspecified: Principal | ICD-10-CM

## 2017-03-12 DIAGNOSIS — E039 Hypothyroidism, unspecified: Secondary | ICD-10-CM

## 2017-03-12 DIAGNOSIS — R1013 Epigastric pain: Secondary | ICD-10-CM

## 2017-03-12 MED ORDER — HEPARIN SODIUM LOCK FLUSH 100 UNIT/ML IV SOLN
500 [IU] | Status: DC | PRN
Start: 2017-03-12 — End: 2017-03-13

## 2017-03-12 MED ORDER — LORAZEPAM 2 MG/ML IJ SOLN
.5 mg | Freq: Once | INTRAVENOUS | Status: CP
Start: 2017-03-12 — End: ?

## 2017-03-12 MED ORDER — SODIUM CHLORIDE 0.9 % IV SOLN
Freq: Once | INTRAVENOUS | Status: CP
Start: 2017-03-12 — End: ?

## 2017-03-12 MED ORDER — DEXAMETHASONE IVPB BCN JX
4 mg | Freq: Once | INTRAVENOUS | Status: CP
Start: 2017-03-12 — End: ?

## 2017-03-13 ENCOUNTER — Encounter: Primary: Internal Medicine

## 2017-03-13 ENCOUNTER — Ambulatory Visit

## 2017-03-13 ENCOUNTER — Inpatient Hospital Stay

## 2017-03-13 DIAGNOSIS — M1990 Unspecified osteoarthritis, unspecified site: Secondary | ICD-10-CM

## 2017-03-13 DIAGNOSIS — R1310 Dysphagia, unspecified: Secondary | ICD-10-CM

## 2017-03-13 DIAGNOSIS — R1013 Epigastric pain: Secondary | ICD-10-CM

## 2017-03-13 DIAGNOSIS — E039 Hypothyroidism, unspecified: Secondary | ICD-10-CM

## 2017-03-13 DIAGNOSIS — K769 Liver disease, unspecified: Secondary | ICD-10-CM

## 2017-03-13 DIAGNOSIS — E43 Unspecified severe protein-calorie malnutrition: Principal | ICD-10-CM

## 2017-03-13 DIAGNOSIS — D219 Benign neoplasm of connective and other soft tissue, unspecified: Secondary | ICD-10-CM

## 2017-03-13 DIAGNOSIS — C50911 Malignant neoplasm of unspecified site of right female breast: Secondary | ICD-10-CM

## 2017-03-13 DIAGNOSIS — K219 Gastro-esophageal reflux disease without esophagitis: Principal | ICD-10-CM

## 2017-03-13 DIAGNOSIS — C160 Malignant neoplasm of cardia: Secondary | ICD-10-CM

## 2017-03-13 DIAGNOSIS — R634 Abnormal weight loss: Secondary | ICD-10-CM

## 2017-03-13 DIAGNOSIS — F419 Anxiety disorder, unspecified: Secondary | ICD-10-CM

## 2017-03-13 DIAGNOSIS — R9389 Abnormal findings on diagnostic imaging of other specified body structures: Secondary | ICD-10-CM

## 2017-03-13 DIAGNOSIS — C169 Malignant neoplasm of stomach, unspecified: Secondary | ICD-10-CM

## 2017-03-13 MED ORDER — ONDANSETRON HCL 4 MG/2ML IJ SOLN
Status: DC | PRN
Start: 2017-03-13 — End: 2017-03-14

## 2017-03-13 MED ORDER — BUPIVACAINE-EPINEPHRINE (PF) 0.25% -1:200000 IJ SOLN
Status: DC | PRN
Start: 2017-03-13 — End: 2017-03-14

## 2017-03-13 MED ORDER — HEPARIN SODIUM (PORCINE) 5000 UNIT/ML IJ SOLN
5000 [IU] | Freq: Once | SUBCUTANEOUS | Status: CP
Start: 2017-03-13 — End: ?

## 2017-03-13 MED ORDER — MIDAZOLAM HCL 2 MG/2ML IJ SOLN
Status: DC | PRN
Start: 2017-03-13 — End: 2017-03-14

## 2017-03-13 MED ORDER — LABETALOL HCL 5 MG/ML IV SOLN
10 mg | INTRAVENOUS | Status: DC | PRN
Start: 2017-03-13 — End: 2017-03-14

## 2017-03-13 MED ORDER — LACTATED RINGERS IV SOLN
Status: DC | PRN
Start: 2017-03-13 — End: 2017-03-14

## 2017-03-13 MED ORDER — NEOSTIGMINE METHYLSULFATE 5 MG/5ML IV SOSY
Status: DC | PRN
Start: 2017-03-13 — End: 2017-03-14

## 2017-03-13 MED ORDER — PROPOFOL INFUSION JX
Status: DC | PRN
Start: 2017-03-13 — End: 2017-03-14

## 2017-03-13 MED ORDER — BUPIVACAINE-EPINEPHRINE (PF) 0.5% -1:200000 IJ SOLN
Status: DC | PRN
Start: 2017-03-13 — End: 2017-03-14

## 2017-03-13 MED ORDER — PROPOFOL 10 MG/ML IV EMUL CUSTOM SH
Status: DC | PRN
Start: 2017-03-13 — End: 2017-03-14

## 2017-03-13 MED ORDER — ACETAMINOPHEN 10 MG/ML IV SOLN
1000 mg | Freq: Once | INTRAVENOUS | Status: DC
Start: 2017-03-13 — End: 2017-03-14

## 2017-03-13 MED ORDER — DEXAMETHASONE SODIUM PHOSPHATE 4 MG/ML CUSTOM COMPONENT JX
Status: DC | PRN
Start: 2017-03-13 — End: 2017-03-14

## 2017-03-13 MED ORDER — LIDOCAINE HCL (PF) 1 % IJ SOLN SH
INTRAVENOUS | Status: DC | PRN
Start: 2017-03-13 — End: 2017-03-14

## 2017-03-13 MED ORDER — HALOPERIDOL LACTATE 5 MG/ML IJ SOLN
.5 mg | INTRAVENOUS | Status: DC | PRN
Start: 2017-03-13 — End: 2017-03-14

## 2017-03-13 MED ORDER — NALOXONE HCL 0.4 MG/ML IJ SOLN
.4 mg | INTRAVENOUS | Status: DC | PRN
Start: 2017-03-13 — End: 2017-03-14

## 2017-03-13 MED ORDER — GLYCOPYRROLATE 0.4 MG/2ML IJ SOLN
Status: DC | PRN
Start: 2017-03-13 — End: 2017-03-14

## 2017-03-13 MED ORDER — NALBUPHINE HCL 10 MG/ML IJ SOLN
2.5 mg | INTRAVENOUS | Status: DC | PRN
Start: 2017-03-13 — End: 2017-03-14

## 2017-03-13 MED ORDER — CEFAZOLIN SODIUM 1 G IJ SOLR
2 g | Freq: Once | INTRAVENOUS | Status: CP
Start: 2017-03-13 — End: ?

## 2017-03-13 MED ORDER — HYDRALAZINE HCL 20 MG/ML IJ SOLN
5 mg | INTRAVENOUS | Status: DC | PRN
Start: 2017-03-13 — End: 2017-03-14

## 2017-03-13 MED ORDER — OXYCODONE HCL 5 MG PO TABS
10 mg | ORAL | Status: DC | PRN
Start: 2017-03-13 — End: 2017-03-14

## 2017-03-13 MED ORDER — IBUPROFEN 600 MG PO TABS
600 mg | Freq: Three times a day (TID) | ORAL | 0 refills | Status: CP
Start: 2017-03-13 — End: ?

## 2017-03-13 MED ORDER — HYDROMORPHONE HCL-NACL 0.5-0.9 MG/ML-% IV SOSY
.5 mg | INTRAVENOUS | Status: DC | PRN
Start: 2017-03-13 — End: 2017-03-14

## 2017-03-13 MED ORDER — FENTANYL CITRATE INJ 50 MCG/ML CUSTOM AMP/VIAL
Status: DC | PRN
Start: 2017-03-13 — End: 2017-03-14

## 2017-03-13 MED ORDER — ROCURONIUM BROMIDE 50 MG/5ML IV SOSY
Status: DC | PRN
Start: 2017-03-13 — End: 2017-03-14

## 2017-03-13 MED ORDER — AMOXICILLIN-POT CLAVULANATE 200-28.5 MG PO CHEW
1 | ORAL_TABLET | Freq: Two times a day (BID) | ORAL | 0 refills | Status: CP
Start: 2017-03-13 — End: 2017-03-14

## 2017-03-13 NOTE — H&P
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 Pt unfortuantely has had inability to maintain herself with PO intake, and thus was feeding jejunostomy was offered to the pt. She agrees and presents today for surgical placement of feeding jejunostomy.       ?Constitutional: Moderate weight loss, moderate fatigue, No fever  Eyes: No double vision  Ears,Nose, Mouth,Throat: No sinus trouble, No sore throat  Cardiac: No lightheadedness, No swelling in legs, No chest pain  Respiratory: No cough, No hemoptysis, No shortness of breath  Gastrointestinal: Increasing nausea vomiting  No diarrhea, No abdominal pain. Mild dysphagia  Genitourinary: No burning on urination  Musculosketal: No bone pain.   Skin: No skin rash  Neurological: No seizures, No loss of balance, No weakness of limbs  Psychiatric: Deferred  Hematologic/Lymphatic/Immunologic: No bleeding, No lumps in arm pits      General: Well developed, well nourished, No acute Distress, Appears stated age  Head:Atraumatic and normocephalic  Cardiovascular: RRR  Chest:Symmetrical, No kyphosis and No scoliosis  Respiratory:Clear, No rales/Rhonchi and Percussion and palpation-Normal  Gastrointestinal:Abdomen soft, Abdomen non-tender, Abdomen non-distended, Abdomen without masses, No ascites.  Musculoskeletal:Normal gait and station, Range of motion normal and Strength/Tone normal  Extremities:No edema, No cyanosis, No digital clubbing and No discoloration  Skin:Negative  Neurologic:Alert and oriented, Alerted orientation/alertness, No hemiplegia, No Hemiparesis, Cranial nerves intact, Paralysis cranial nerves, No sensory deficit and No motor deficits    A/P    61 yo female with locally advance adenoca of GE junction, currently undergonig neoadjuvant therapy, requiring enteral access to maintain herself. To go for feeding jejunostomy

## 2017-03-13 NOTE — Immediate Post-Operative Note
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 A Resident/ACGME fellow participated in this case: Yes   2. Participating Resident/ACGME fellow was qualified for case type: Yes    3. Teaching surgeon is present entire case (including opening & closing): Yes      Findings/Description: EUA, closed hemorroidectomy x2    Specimens:       SCD's applied: yes    EBL: 10 mls

## 2017-03-13 NOTE — Anesthesia Preprocedure Evaluation
Anesthesia Plan:            Anesthesia Attending:

## 2017-03-13 NOTE — Immediate Post-Operative Note
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 A Resident/ACGME fellow participated in this case: Yes   2. Participating Resident/ACGME fellow was qualified for case type: Yes    3. Teaching surgeon is present entire case (including opening & closing): Yes      Findings/Description: Jejunostomt tube successfully placed. 30ml in balloon    Specimens: None    SCD's applied: yes    EBL: 5 mls

## 2017-03-13 NOTE — Immediate Post-Operative Note
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 A Resident/ACGME fellow participated in this case: Yes   2. Participating Resident/ACGME fellow was qualified for case type: Yes    3. Teaching surgeon is present entire case (including opening & closing): Yes      Findings/Description: Laparoscopic j-tube successfully placed. 20ml in balloon    Specimens: None    SCD's applied: yes    EBL: 5 mls

## 2017-03-13 NOTE — Discharge Instr - AVS First Page
No bathing or swimming for 2 weeks until the incisions are healed. You can shower.

## 2017-03-13 NOTE — Nursing Post-Procedure Note
1800 awake and alert  Sister at bedside showed how to use jejunostomy tube. Allowed to ask questions able to open and close the tube without difficulty.  1825 discharged to lobby by [facility] via wheelchair to meet sister to go home.

## 2017-03-13 NOTE — Anesthesia Preprocedure Evaluation
Obesity: underweight.        Anesthesia Plan:       Patient has an ASA score of 3 and has been NPO for > 8 hours. Plan for anesthesia technique is general.  With an intravenous type of induction. Anesthetic plan and risks has been discussed with the patient. The vascular access plan include the use of peripheral. The following pulse oximetry, body tempature, ETC02, NIBP and 6-lead EKG will be used for intraoperative monitoring.   Post operative pain will be controled by IV meds and oral meds.       Anesthesia Attending:   Airway: Physical exam of airway reveals, mouth opening of  >3 FB, with a TM distance of  < 6 cm, with full neck ROM, Mallampati score II.       Pulmonary: On pulmonary examination breath sounds clear to auscultation.   Cardiovascular: Negative for cardio exam except otherwise noted.   GCS: Patients total GCS score is 15, with 4 for eye response, with 5 for verbal response, and with 6 for motor response.I have performed a pre-anesthesia examination, evaluation, and prescribed the anesthesia plan.

## 2017-03-13 NOTE — Immediate Post-Operative Note
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 A Resident/ACGME fellow participated in this case: Yes   2. Participating Resident/ACGME fellow was qualified for case type: Yes    3. Teaching surgeon is present entire case (including opening & closing): Yes      Findings/Description: EUA, closed hemorroidectomy x2    Specimens:       SCD's applied: yes    EBL: 10 mls

## 2017-03-13 NOTE — H&P
Pt not to be admitted this is an outpt procedure  M Diebel

## 2017-03-13 NOTE — Immediate Post-Operative Note
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 A Resident/ACGME fellow participated in this case: Yes   2. Participating Resident/ACGME fellow was qualified for case type: Yes    3. Teaching surgeon is present entire case (including opening & closing): Yes      Findings/Description: Laparoscopic j-tube successfully placed. 77ml in balloon    Specimens: None    SCD's applied: yes    EBL: 5 mls

## 2017-03-13 NOTE — Anesthesia Postprocedure Evaluation
Post Anesthesia Eval    Respiratory function appropriate( normal respiratory rate and oxygen saturation, airway patent )  Cardiovascular function appropriate( normal heart rate and blood pressure )  Mental status appropriate  Patient normothermic  Pain well controlled  No uncontrolled nausea and vomiting  Patient appropriately hydrated  No apparent anesthesia complications  Patient tolerated procedure well

## 2017-03-13 NOTE — Nursing Post-Procedure Note
1800 awake and alert  Sister at bedside showed how to use jejunostomy tube. Allowed to ask questions able to open and close the tube without difficulty.  1825 discharged to lobby by [facility] via wheelchair to meet sister to go home.

## 2017-03-13 NOTE — Immediate Post-Operative Note
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 A Resident/ACGME fellow participated in this case: Yes   2. Participating Resident/ACGME fellow was qualified for case type: Yes    3. Teaching surgeon is present entire case (including opening & closing): Yes      Findings/Description: Jejunostomt tube successfully placed. 62ml in balloon    Specimens: None    SCD's applied: yes    EBL: 5 mls

## 2017-03-13 NOTE — Anesthesia Preprocedure Evaluation
Department of Anesthesiology  Pre-operative Evaluation Record    Patient Name: Mariah Cameron                  Sex: female                  Age: 61 y.o.                  MRN: [id number]     Allergies:   Patient has no known allergies.    Current Med List   Medication Sig   ? ALPRAZolam (XANAX) 0.5 MG PO Tablet TK 1 T PO QHS   ? ARMOUR THYROID 15 MG PO Tablet TK 3 TS PO ONCE D OES   ? eszopiclone (LUNESTA) 3 MG PO Tablet    ? HYDROcodone-acetaminophen (NORCO) 5-325 MG PO Tablet TK 1-2 TS PO Q 6 H   ? ondansetron (ZOFRAN-ODT) 8 MG PO Tablet Disintegrating Dissolve 1 tablet in mouth every 8 hours as needed for nausea.   ? prochlorperazine (COMPAZINE) 10 MG PO Tablet Take 1 tablet by mouth every 6 hours as needed.       Prior Surgeries:     Past Surgical History:   Procedure Laterality Date   ? BREAST RECONSTRUCTION  2016   ? CATARACT EXTRACTION W/ INTRAOCULAR LENS IMPLANT     ? COLONOSCOPY W/ BIOPSY  01/28/2017   ? EGD W/ BIOPSY  01/28/2017   ? EUS UPPER W/ FNA  02/08/2017   ? HYSTERECTOMY  2003    uterine fibroids   ? LASIK     ? MASTECTOMY  2011       Social   ETOH:       Alcohol Use   ? Yes     Tobacco:   History   Smoking Status   ? Former Smoker   ? Packs/day: 1.00   ? Years: 15.00   ? Types: Cigarettes   ? Start date: 06/24/1974   ? Quit date: 11/29/1988   Smokeless Tobacco   ? Never Used     Drugs:   History   Drug Use   ? Frequency: 7.0 times per week   ? Types: Marijuana       System Evaluation and Medical History:     Anesthetic Complications: PONV.   Pulmonary: Negative for pulmonary ROS except otherwise noted.   Neuro/Psych:  The patient has a psychiatric history of :  anxiety and depression.   Cardiovascular: Negative cardio ROS except otherwise noted.   GI/Hepatic: abdominal pain, vomiting and diarrhea. Adenocarcinoma of stomach.   Renal: Negative for renal ROS except otherwise noted   Endo/Other: malignancy. The patient has a history of osteoarthritis.     Hx breast CA      Physical Exam:

## 2017-03-14 MED ORDER — AMOXICILLIN-POT CLAVULANATE 400-57 MG PO CHEW
1 | ORAL_TABLET | Freq: Two times a day (BID) | ORAL | 0 refills | Status: CP
Start: 2017-03-14 — End: 2018-01-30

## 2017-03-16 NOTE — Op Note
PATIENT NAME:  Mariah Cameron  MRN:   [id number]  DATE OF SERVICE:  03/13/2017    PREOPERATIVE DIAGNOSIS:  Adenocarcinoma of the gastric cardia.    POSTOPERATIVE DIAGNOSIS:  Adenocarcinoma of the gastric cardia.    PROCEDURE PERFORMED:  1.  Diagnostic laparoscopy.  2.  Laparoscopic feeding jejunostomy tube placement.  3.  Transverse abdominis panel block.    NAME OF ATTENDING SURGEON:  Junie Spencer, MD, FACS.    COMPLICATIONS:  Donata Duff, MD    BRIEF CLINICAL HISTORY:  Ms. Strub is a 61 year old Caucasian female with diagnosis of adenocarcinoma of the gastric cardia.  She had developed progressive malnutrition and inability to maintain weight.  After discussion with the family, we decided to go ahead with a laparoscopic feeding jejunostomy tube placement.  Pros, cons and possible complications of the procedure were explained to patient in detail and she is in agreement to proceed.    TECHNICAL FINDINGS:  The procedure was performed in main OR under general anesthesia.  The patient was in the supine position.  IV antibiotics were given.  Surgical pause was conducted, confirming patient's ID, operative site, procedure performed, attending surgeon, allergies and possible implants.  The skin of the abdomen was prepped and draped in routine manner.  A 41mm skin incision was made in the left upper quadrant just below the costal margin, midway between the midclavicular and the anterior axillary line.  The Veress needle was introduced in the peritoneal cavity without any difficulty.  Water drop test confirmed accurate placement of the Veress needle.  CO2 insufflation was commenced at a set pressure of 42mmHg.  Once adequate pneumoperitoneum was established, a 56mm subumbilical trocar was inserted into the peritoneal cavity.  The 33mm 30-degree telescope was used for laparoscopic visualization.  Initial laparoscopic assessment identified the Veress needle lying freely in the

## 2017-03-16 NOTE — Op Note
released.  43mL of 0.25% Marcaine with epinephrine was used for the TAP block.  All skin incisions were closed with a running suture of 4-0 Monocryl subcuticular.  Dressing consisted of Dermabond.  The patient tolerated the procedure nicely.  The patient was extubated and transferred to the Recovery Unit in stable condition.  I was present and scrubbed throughout the procedure.      Gracy Bruins, MD    ZA/NTS  DD:  03/13/2017 17:05:49  DT:  03/13/2017 17:32:10  Job#:  [id number]  Conf#:  [id number]

## 2017-03-16 NOTE — Op Note
left upper quadrant.  The Veress needle was removed and the CO2 insufflation was changed to the main operating trocar.  Initial diagnostic laparoscopy did not show any evidence of omental or peritoneal disease.  Two 70mm trocars were placed on the right side of the abdomen and a 85mm trocar was placed in the left lower quadrant.  We examined the undersurface of the left lobe of the liver.  This was pristine.  However, the lesion can be clearly seen at the anterior aspect of the proximal stomach which leads me to believe that it is an at least T4A disease.  Again, as mentioned earlier, the omentum, peritoneum as well as the pelvis were examined and all were clean.  The ligament of Treitz was identified and a point approximately 20cm distal to that was chosen for the jejunostomy tube placement.  Four sutures of 2-0 silk, 30cm length, were placed at 12 o'clock, 3 o'clock, 6 o'clock and 9 o'clock position.  Each suture was pulled to the outside of the corresponding skin mark in the left upper quadrant in a diamond-shaped configuration.  The 12 o'clock and 3 o'clock sutures were tied snuggly.  A small skin incision was made right through the center of the diamond-shaped configuration.  A small enterotomy was made with the [place] and a Bovie and the 12-French jejunostomy tube was introduced from the outside using a tonsil dissector into the peritoneal cavity and then we fed it into the lumen of the small bowel.  In doing so, we tightened the 6 o'clock suture.  The jejunostomy tube was fed very nicely into the lumen of the bowel under direct visualization.  Once the balloon part passed inside the lumen of the bowel, the balloon was inflated with 83mL of water and the 9 o'clock stitch was sutured.  Final visualization did not show any evidence of blood, pus or enteric contents.  Instrument, lap count, sponge count was correct.  All the trocars were removed under direct visualization.  CO2 gas was

## 2017-03-16 NOTE — Op Note
PATIENT NAME:  Mariah Cameron  MRN:   [id number]  DATE OF SERVICE:  03/13/2017    PREOPERATIVE DIAGNOSIS:  Adenocarcinoma of the gastric cardia.    POSTOPERATIVE DIAGNOSIS:  Adenocarcinoma of the gastric cardia.    PROCEDURE PERFORMED:  1.  Diagnostic laparoscopy.  2.  Laparoscopic feeding jejunostomy tube placement.  3.  Transverse abdominis panel block.    NAME OF ATTENDING SURGEON:  Junie Spencer, MD, FACS.    COMPLICATIONS:  Donata Duff, MD    BRIEF CLINICAL HISTORY:  Mariah Cameron is a 61 year old Caucasian female with diagnosis of adenocarcinoma of the gastric cardia.  She had developed progressive malnutrition and inability to maintain weight.  After discussion with the family, we decided to go ahead with a laparoscopic feeding jejunostomy tube placement.  Pros, cons and possible complications of the procedure were explained to patient in detail and she is in agreement to proceed.    TECHNICAL FINDINGS:  The procedure was performed in main OR under general anesthesia.  The patient was in the supine position.  IV antibiotics were given.  Surgical pause was conducted, confirming patient's ID, operative site, procedure performed, attending surgeon, allergies and possible implants.  The skin of the abdomen was prepped and draped in routine manner.  A 57mm skin incision was made in the left upper quadrant just below the costal margin, midway between the midclavicular and the anterior axillary line.  The Veress needle was introduced in the peritoneal cavity without any difficulty.  Water drop test confirmed accurate placement of the Veress needle.  CO2 insufflation was commenced at a set pressure of 36mmHg.  Once adequate pneumoperitoneum was established, a 81mm subumbilical trocar was inserted into the peritoneal cavity.  The 59mm 30-degree telescope was used for laparoscopic visualization.  Initial laparoscopic assessment identified the Veress needle lying freely in the

## 2017-03-16 NOTE — Op Note
released.  25mL of 0.25% Marcaine with epinephrine was used for the TAP block.  All skin incisions were closed with a running suture of 4-0 Monocryl subcuticular.  Dressing consisted of Dermabond.  The patient tolerated the procedure nicely.  The patient was extubated and transferred to the Recovery Unit in stable condition.  I was present and scrubbed throughout the procedure.      Gracy Bruins, MD    ZA/NTS  DD:  03/13/2017 17:05:49  DT:  03/13/2017 17:32:10  Job#:  [id number]  Conf#:  [id number]

## 2017-03-16 NOTE — Op Note
left upper quadrant.  The Veress needle was removed and the CO2 insufflation was changed to the main operating trocar.  Initial diagnostic laparoscopy did not show any evidence of omental or peritoneal disease.  Two 41mm trocars were placed on the right side of the abdomen and a 38mm trocar was placed in the left lower quadrant.  We examined the undersurface of the left lobe of the liver.  This was pristine.  However, the lesion can be clearly seen at the anterior aspect of the proximal stomach which leads me to believe that it is an at least T4A disease.  Again, as mentioned earlier, the omentum, peritoneum as well as the pelvis were examined and all were clean.  The ligament of Treitz was identified and a point approximately 20cm distal to that was chosen for the jejunostomy tube placement.  Four sutures of 2-0 silk, 30cm length, were placed at 12 o'clock, 3 o'clock, 6 o'clock and 9 o'clock position.  Each suture was pulled to the outside of the corresponding skin mark in the left upper quadrant in a diamond-shaped configuration.  The 12 o'clock and 3 o'clock sutures were tied snuggly.  A small skin incision was made right through the center of the diamond-shaped configuration.  A small enterotomy was made with the [place] and a Bovie and the 12-French jejunostomy tube was introduced from the outside using a tonsil dissector into the peritoneal cavity and then we fed it into the lumen of the small bowel.  In doing so, we tightened the 6 o'clock suture.  The jejunostomy tube was fed very nicely into the lumen of the bowel under direct visualization.  Once the balloon part passed inside the lumen of the bowel, the balloon was inflated with 76mL of water and the 9 o'clock stitch was sutured.  Final visualization did not show any evidence of blood, pus or enteric contents.  Instrument, lap count, sponge count was correct.  All the trocars were removed under direct visualization.  CO2 gas was

## 2017-03-17 DIAGNOSIS — C169 Malignant neoplasm of stomach, unspecified: Principal | ICD-10-CM

## 2017-03-18 ENCOUNTER — Encounter: Primary: Internal Medicine

## 2017-03-19 ENCOUNTER — Inpatient Hospital Stay: Admit: 2017-03-19 | Discharge: 2017-03-20 | Primary: Internal Medicine

## 2017-03-19 ENCOUNTER — Ambulatory Visit: Admit: 2017-03-19 | Discharge: 2017-03-20 | Attending: Hematology & Oncology | Primary: Internal Medicine

## 2017-03-19 DIAGNOSIS — Z800 Family history of malignant neoplasm of digestive organs: Secondary | ICD-10-CM

## 2017-03-19 DIAGNOSIS — C50911 Malignant neoplasm of unspecified site of right female breast: Secondary | ICD-10-CM

## 2017-03-19 DIAGNOSIS — C160 Malignant neoplasm of cardia: Principal | ICD-10-CM

## 2017-03-19 DIAGNOSIS — Z87891 Personal history of nicotine dependence: Secondary | ICD-10-CM

## 2017-03-19 DIAGNOSIS — R9389 Abnormal findings on diagnostic imaging of other specified body structures: Secondary | ICD-10-CM

## 2017-03-19 DIAGNOSIS — Z79899 Other long term (current) drug therapy: Secondary | ICD-10-CM

## 2017-03-19 DIAGNOSIS — R634 Abnormal weight loss: Secondary | ICD-10-CM

## 2017-03-19 DIAGNOSIS — R1310 Dysphagia, unspecified: Secondary | ICD-10-CM

## 2017-03-19 DIAGNOSIS — Z803 Family history of malignant neoplasm of breast: Secondary | ICD-10-CM

## 2017-03-19 DIAGNOSIS — M1990 Unspecified osteoarthritis, unspecified site: Secondary | ICD-10-CM

## 2017-03-19 DIAGNOSIS — R1013 Epigastric pain: Secondary | ICD-10-CM

## 2017-03-19 DIAGNOSIS — F419 Anxiety disorder, unspecified: Secondary | ICD-10-CM

## 2017-03-19 DIAGNOSIS — D219 Benign neoplasm of connective and other soft tissue, unspecified: Secondary | ICD-10-CM

## 2017-03-19 DIAGNOSIS — K219 Gastro-esophageal reflux disease without esophagitis: Principal | ICD-10-CM

## 2017-03-19 DIAGNOSIS — C169 Malignant neoplasm of stomach, unspecified: Secondary | ICD-10-CM

## 2017-03-19 DIAGNOSIS — E039 Hypothyroidism, unspecified: Secondary | ICD-10-CM

## 2017-03-19 DIAGNOSIS — K769 Liver disease, unspecified: Secondary | ICD-10-CM

## 2017-03-19 MED ORDER — OXALIPLATIN INFUSION BCN JX
85 mg/m2 | Freq: Once | INTRAVENOUS | Status: DC
Start: 2017-03-19 — End: 2017-03-20

## 2017-03-19 MED ORDER — DEXTROSE 5 % IV SOLN
Freq: Once | INTRAVENOUS | Status: CP
Start: 2017-03-19 — End: ?

## 2017-03-19 MED ORDER — FLUOROURACIL INFUSION AMB PUMP BCN JX
2400 mg/m2 | Freq: Once | INTRAVENOUS | Status: DC
Start: 2017-03-19 — End: 2017-03-20

## 2017-03-19 MED ORDER — FLUOROURACIL IV SYRINGE BCN JX
400 mg/m2 | Freq: Once | INTRAVENOUS | Status: CP
Start: 2017-03-19 — End: ?

## 2017-03-19 MED ORDER — FOSAPREPITANT IVPB JX
150 mg | Freq: Once | INTRAVENOUS | Status: CP
Start: 2017-03-19 — End: ?

## 2017-03-19 MED ORDER — LEUCOVORIN IVPB JX
400 mg/m2 | Freq: Once | INTRAVENOUS | Status: CP
Start: 2017-03-19 — End: ?

## 2017-03-19 MED ORDER — ONDANSETRON WITH DEXAMETHASONE IVPB (NON-SPECIFIED)
Freq: Once | INTRAVENOUS | Status: CP
Start: 2017-03-19 — End: ?

## 2017-03-19 NOTE — Progress Notes
Respiratory:Clear, No rales/Rhonchi and Percussion and palpation-Normal  Gastrointestinal: Soft, jejunostomy tube in place and functioning well, no mass, no hepatosplenomegaly  Hematologic/Lymphatic:No petechiae, No purpura, No neck lymphadenopathy, No Axillary lymphadenopathy and No Groin lymphadenopathy  Musculoskeletal:Normal gait and station, Range of motion normal and Strength/Tone normal  Extremities:No edema, No cyanosis, No digital clubbing and No discoloration  Skin:Negative  Neurologic:Alert and oriented, Alerted orientation/alertness, No hemiplegia, No Hemiparesis, Cranial nerves intact, Paralysis cranial nerves, No sensory deficit and No motor deficits  Rectal Exam:Deferred  Genitourinary:Deferred    Assessment:    [name] with 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 The patient has a Mediport placed as of this Friday, 02/22/2017. I discussed today with her patient and her sister regarding her diagnosis, prognosis and treatments for this type of tumor. I explained in great detail the effects and side effects of chemotherapy especially neuropathy with oxaliplatin possible chest pain or discomfort with 5-FU, less of alopecia and no major other side effects. In general this tablet treatment is relatively well tolerated without excessive cytopenias. I prescribed antinausea medications in case of chemotherapy associated nausea and vomiting, to her local pharmacy. We did add toward with the patient and the sister of our chemotherapy unit and introduce her to our chemotherapy nurses. I have prescribed chemotherapy to start tentatively on Wednesday, Feb 27, 2017.    Given her heavy family history of breast cancer, colon cancer, prostate cancer, I have requested a genetic evaluation testing by myriad

## 2017-03-19 NOTE — Progress Notes
?    ARMOUR THYROID 15 MG PO Tablet, TK 3 TS PO ONCE D OES, Disp: , Rfl: 3  ?  bimatoprost (LATISSE) 0.03 % EX Solution, , Disp: , Rfl: 11  ?  buPROPion HCl (WELLBUTRIN XL) 300 MG PO Tablet Extended Release 24 Hour, TK 1 T   PO IN THE MORNING ONCE D, Disp: , Rfl: 2  ?  CARAFATE 1 GM/10ML PO Suspension, TK 10 ML PO TID OES 1 HOUR B MEALS AND HS, Disp: , Rfl: 2  ?  diclofenac (VOLTAREN GEL) 1 % TD Gel, APP AA BID UTD, Disp: , Rfl: 0  ?  Diclofenac Sodium CR 100 MG PO Tablet Extended Release 24 Hour, TK 1 T PO ONCE A DAY, Disp: , Rfl: 2  ?  DOK 100 MG PO Capsule, TK ONE C PO BID WITH PLENTY OF WATER, Disp: , Rfl: 0  ?  eszopiclone (LUNESTA) 3 MG PO Tablet, , Disp: , Rfl: 2  ?  HYDROcodone-acetaminophen (NORCO) 5-325 MG PO Tablet, TK 1-2 TS PO Q 6 H, Disp: , Rfl: 0  ?  omeprazole (PriLOSEC) 40 MG PO Capsule Delayed Release, TK 1 C PO QD AC, Disp: , Rfl: 5  ?  ondansetron (ZOFRAN-ODT) 8 MG PO Tablet Disintegrating, Dissolve 1 tablet in mouth every 8 hours as needed for nausea., Disp: 30 tablet, Rfl: 11  ?  prochlorperazine (COMPAZINE) 10 MG PO Tablet, Take 1 tablet by mouth every 6 hours as needed., Disp: 60 tablet, Rfl: 11  ?  tiZANidine (ZANAFLEX) 4 MG PO Tablet, TK 1 T PO QHS PRN, Disp: , Rfl: 0  ?  traMADol (ULTRAM) 50 MG PO Tablet, TK 1 T PO Q 6 H PRF PAIN, Disp: , Rfl: 0  ?  amoxicillin-clavulanate (AUGMENTIN) 400-57 MG PO Tablet Chewable, Chew 1 tablet 2 times daily for 1 day., Disp: 2 tablet, Rfl: 0  ?  ibuprofen (ADVIL,MOTRIN) 600 MG PO Tablet, Take 1 tablet by mouth 3 times daily for 5 days., Disp: 15 tablet, Rfl: 0  No current facility-administered medications for this visit.     Facility-Administered Medications Ordered in Other Visits:   ?  fluorouracil (ADRUCIL) 3,975 mg in 0.9 % NaCl 92 mL chemo infusion in ambulatory pump, 2,400 mg/m2 (Treatment Plan Recorded), Intravenous, Once, Agaliotis, Dimitrios, MD, 3,975 mg at 03/19/17 1355  ?  oxaliplatin (ELOXATIN) 140 mg in D5W 250 mL chemo infusion, 85 mg/m2

## 2017-03-19 NOTE — Progress Notes
also reviewed the recent hospitalization for surgery as well  For rest of her medical problem and continue with current management and follow with her primary physician. I have encouraged her to call me directly if she has any questions or if I can be of any further assistance. Results of most recent studies discussed with patient. All questions were answered to patient's satisfaction. Patient agreeable to the above plan. More than 50% of that time was spent on coordination of care, counseling, further diagnostic and therapeutic clinic and follow-up care. This document was created using voice recognition software. Inadvertent typographical errors, word omissions and/or word substitutions may exist.       Return to clinic, Test ordered, Chemo ordered FOLFOX, Review/Obtain old records, Diagnosis and Care Plan discussed, Reviewed NCCN Guidelines , Prognostic/predictive markers reviewed, Treatment Goal-Disease Free Survival, Literature review, Risk/Treatment Benefit, Stable/Treatment ordered, Chemo Education, Side effect reviewed, Bone involvement/Bisphosphonates, Reviewed contacting office//after hours contact, Chemotherapy plan reviewed, Restaging Plan, Alternative Options discussed, Supportive Care Measures discussed, Dehydration/IV Fluids, Radiology Review, Mediport placement, All questions answered, Counseling/Coordination of Care and Prescription renewals    Return to Clinic:  in 4 weeks.  Dr. Rosaura Carpenter,  Dr. Carmelina Paddock, Dr. Freddrick March  .

## 2017-03-19 NOTE — Progress Notes
-    MILD CHRONIC GASTRITIS WITH MINIMAL ACTIVITY.   -  NO HELICOBACTER PYLORI IDENTIFIED BY MORPHOLOGICAL EVALUATION.   -  NO INTESTINAL METAPLASIA, DYSPLASIA OR MALIGNANCY IDENTIFIED.    C. POLYP DESCENDING (ENDOSCOPIC BIOPSY):   -  TUBULAR ADENOMA   -  NEGATIVE FOR HIGH-GRADE DYSPLASIA AND MALIGNANCY  HER2 Non-Breast: NEGATIVE  Score: 1+    02/08/2017. LIVER (FINE NEEDLE ASPIRATION):   -  NO EVIDENCE OF MALIGNANCY IDENTIFIED.    -  SMALL DETACHED FRAGMENTS OF LIVER PARENCHYMA AND SUPERFICIAL GASTRIC        FOVEOLAR EPITHELIUM.    Imaging Results:     02/14/2017, PET/CT scan showed FDG uptake in the anterior wall of the antrum consistent with malignancy, 1.87 with a lymph node within the stomach and liver, no obvious liver metastasis  02/21/2017, MRI of the abdomen there is 4-5 mm nonenhancing lesion noted within the left lobe of the liver which were present and atypical cyst or small hemangioma no obvious additional lesions are seen within the liver, there is a mass within the stomach consistent with patient's history of stomach cancer  I have reviewed these reports with the patient today March 04, 2017  EUS, staged as T3 N3, M1 although biopsy of the liver negative for malignancy as his MRI and the PET scan    Physical Exam: This was performed today on 03/04/2017 and indicated:    General: Well developed, well nourished, No acute Distress, Chronically ill-looking, Lady patient and Appears stated age  Head:Atraumatic and normocephalic  Eyes:PERRLA, EOMS intact, No jaundice, pallor and Conjunctiva clear, Tongue is dry, slightly dehydrated   Ears, Nose, Throat, Neck, and Mouth: Trachea Midline, No JVD, No Lymphadenopathy, Thyroid midline-normal, Neck supple, No oral lesions and Tracheostomy not present  Cardiovascular:S1,S2 no murmurs, No rubs or clicks, No bruits, Regular heart beat and No parasternal heave  Breast:N/A  Chest:Symmetrical, No kyphosis and No scoliosis

## 2017-03-19 NOTE — Progress Notes
Chauncey Reading, MD  [address]  [place] [address]  [facility] [place] [address]    Chief Complaint   Patient presents with   ? Follow-up   The patient has been referred to this clinic for workup and evaluation and treatment of adenocarcinoma of the GE junction.    Patient Name: Mariah Cameron      Date Of Birth: Feb 27, 1956    History of Present Illness:    Date: 02/19/2017    Ms. 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 The patient started having severe acid reflux syndrome in November 2017 and she ended up having an endoscopy on 01/28/2017 with also endoscopic ultrasound on 02/08/2017. Results of the pathology by biopsy of a fungating mass were discussed as above. According to the patient's report the tumor was HER-2 negative. She was initially evaluated by medical oncologist at [facility] [place] but she preferred to transfer her treatment to this facility and also to the [place] and she has seen Dr. Freddrick March also as of yesterday, who recommended an MRI of the liver as well as neoadjuvant chemotherapy. I do not have results of her PET/CT scan but the patient told me verbally that this was negative for liver lesions, initially described at a recent CT scan of the upper and lower abdomen. There was no other distant metastasis but there was lymphadenopathy associated with the stomach cancer. The patient is scheduled to undergo a Mediport placement by Dr. Dara Lords as of Friday 02/22/2017. She was suggested to have FOLFOX chemotherapy as soon as possible.    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 She had

## 2017-03-19 NOTE — Progress Notes
she preferred to transfer her treatment to this facility and also to the [place] and she has seen Dr. Freddrick March also as of yesterday, who recommended an MRI of the liver as well as neoadjuvant chemotherapy. I do not have results of her PET/CT scan but the patient told me verbally that this was negative for liver lesions, initially described at a recent CT scan of the upper and lower abdomen. There was no other distant metastasis but there was lymphadenopathy associated with the stomach cancer. The patient is scheduled to undergo a Mediport placement by Dr. Dara Lords as of Friday 02/22/2017. She was suggested to have FOLFOX chemotherapy as soon as possible.  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 Patient couldn't tolerate tamoxifen, which has been discontinued. Patient also BRCA 1 and 2 negative in the past  Patient is here for the first course of chemotherapy with FOLFOX. She has many questions about her disease process as well as the biopsy results and also MRI results. I have discussed this in length with the patient and felt that currently she is only as stage III disease by MRI. And as per Dr. Freddrick March, surgeons recommendation I will proceed with FOLFOX chemotherapy at this time. Patient still has mild dysphagia but able to bring labs of protein supplements. Denies any fever or chills cough or chest pain or shortness of breath. I left a message with Dr. Robbie Lis, oncologist from [facility]

## 2017-03-19 NOTE — Progress Notes
laboratories. At [facility] [place] Foundation testing was requested and results need to be followed for molecular biology of the tumor to include MSI status or other clinically relevant molecular markers.    The patient will return to this clinic for follow-up, most probable with Dr. Lurena Joiner, in 2 weeks after her first round of chemotherapy to assess for toxicity and possible side effects.    The patient and her sister had a long list of questions regarding diagnosis, treatment, sequencing of multi modality treatments, side effects of chemotherapy and this questions were answered to the best of my knowledge during this initial evaluation.    It is of note is that the knee CT scan of the chest abdomen and pelvis from Feb 05, 2017 to indeterminate subcentimeter low-density lesions were noted within the lateral segment of the left hepatic lobe. These were biopsied by fine-needle aspiration at [facility] and were negative for malignancy. Most recent PET CT scan at Dr. Dario Guardian facility did not confirm liver metastasis.    With today's evaluation also I have requested baseline laboratory testing to include a CBC, CMP, LDH, CEA.  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 If she does have a good response she'll be a candidate for curative surgery. Also extensively discussed with him about the toxicities of chemo therapy which may include mild nausea vomiting alopecia myelosuppression, oxaliplatin-induced neuropathy, excessive sensitivity to cold temperature as well as occasional mucositis from 5-fluorouracil. Patient and her sister voiced her good understanding and are anxious to proceed with chemo therapy. I plan to see her back again in 10 days CBC CMP for follow-up

## 2017-03-19 NOTE — Progress Notes
Mariah Cameron to see whether she had molecular analysis sent  Interim medical history, surgical history, family history, social history were reviewed, no major change from last visit June 2018   March 07, 2017, chief complaints  Patient is recently found to have poorly differentiated carcinoma of the GE junction, T3 N3 M0, start her on chemotherapy with FOLFOX.  Patient is here complains of increasing nausea and vomiting, not able to keep anything down., She is having obstructive symptoms., She had fluids yesterday as well. Denies any fever or chills or cough or chest pain feeling very tired and fatigued lost 2-3 pounds last few days, I have discussed with Dr. 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  Since I saw her last patient underwent diagnostic laparoscopy and feeding jejunostomy tube placement by Dr. Freddrick March on 03/13/2017. Patient tolerated procedure well currently receiving tube feeding and tolerating well. Patient was found to have possible T4 lesion but no metastasis to the omentum or peritoneum or to the pelvis. Patient is here for the second course of FOLFOX chemotherapy. Patient denies any fever or chills cough chest pain or shortness of breath. Eating only very minimal using the J-tube. No nausea vomiting diarrhea or abdominal discomfort, no other new complaints  Interim medical history, surgical history, family history social history were reviewed including recent laparoscopic J-tube placement      Past Surgical History:   Past Surgical History:   Procedure Laterality Date   ? BREAST RECONSTRUCTION  2016   ? CATARACT EXTRACTION W/ INTRAOCULAR LENS IMPLANT     ? COLONOSCOPY W/ BIOPSY  01/28/2017   ? EGD W/ BIOPSY  01/28/2017   ? EUS UPPER W/ FNA  02/08/2017   ? HYSTERECTOMY  2003

## 2017-03-19 NOTE — Progress Notes
(  Treatment Plan Recorded), Intravenous, Once (infusion), Agaliotis, Dimitrios, MD, Stopped at 03/19/17 1324      Labs:   Hospital Outpatient Visit on 03/19/2017   Component Date Value   ? WBC 03/19/2017 10.76    ? RBC 03/19/2017 3.60    ? Hemoglobin 03/19/2017 11.5*   ? Hematocrit 03/19/2017 34.4*   ? MCV 03/19/2017 95.6    ? [facility] 03/19/2017 31.9    ? MCHC 03/19/2017 33.4    ? RDW 03/19/2017 12.0    ? Platelet Count 03/19/2017 177    ? MPV 03/19/2017 10.8    ? Neutrophils Absolute 03/19/2017 7.87    ? Neutrophils % 03/19/2017 73.2    ? Lymphocytes Absolute 03/19/2017 1.50    ? Lymphocytes % 03/19/2017 13.9*   ? Monocytes Absolute 03/19/2017 1.12    ? Monocytes % 03/19/2017 10.4    ? Eosinophils Absolute 03/19/2017 0.24    ? Eosinophils % 03/19/2017 2.2    ? Basophil Absolute 03/19/2017 0.03    ? BASOPHILS 03/19/2017 0.3    Admission on 03/13/2017, Discharged on 03/13/2017   Component Date Value   ? Sodium 03/13/2017 143    ? Potassium 03/13/2017 3.5    ? Chloride 03/13/2017 102    ? CO2 03/13/2017 25    ? Urea Nitrogen 03/13/2017 9    ? Creatinine 03/13/2017 0.69    ? BUN/Creatinine Ratio 03/13/2017 13.0    ? Glucose 03/13/2017 85    ? Calcium 03/13/2017 9.8    ? Osmolality Calc 03/13/2017 282.9    ? Anion Gap 03/13/2017 16    ? EGFR 03/13/2017 >59    ? Protime 03/13/2017 13.2    ? INR 03/13/2017 1.0    ? WBC 03/13/2017 8.19    ? RBC 03/13/2017 3.58*   ? Hemoglobin 03/13/2017 11.4*   ? Hematocrit 03/13/2017 32.0*   ? MCV 03/13/2017 89.4    ? [facility] 03/13/2017 31.8    ? MCHC 03/13/2017 35.6    ? RDW 03/13/2017 11.9*   ? Platelet Count 03/13/2017 219    ? MPV 03/13/2017 10.9    ? nRBC % 03/13/2017 0.0    ? Absolute NRBC Count 03/13/2017 0.00    ? Neutrophils % 03/13/2017 46.8    ? Lymphocytes % 03/13/2017 40.8    ? Monocytes % 03/13/2017 9.2*   ? Eosinophils % 03/13/2017 2.3    ? Immature Granulocytes % 03/13/2017 0.5    ? Neutrophils Absolute 03/13/2017 3.84    ? Lymphocytes Absolute 03/13/2017 3.34

## 2017-03-19 NOTE — Progress Notes
I also left the message with Dr. Robbie Lis to see whether he has sent specimen for Foundation molecular analysis if not I will send to [facility] for the same analysis  I spent more than 40 minutes including 30 minutes counseling time and answered all her questions to their satisfaction  March 07, 2017  This patient has persistent nausea vomiting and also has some obstructive symptoms from GE junction tumor, I have discussed the case with Dr. Freddrick March was suggested to have a jejunostomy tube placed for nutritional purposes. Dr. Freddrick March also talked to the patient to phone regarding the need of jejunostomy tube. Patient understand the situation and she is willing to proceed with the jejunostomy tube. Dr. Vilinda Blanks will make appropriate arrangements. I will give IV fluid today and tomorrow and advised to drink protein drinks as much she can. I also twice her that in today to have nausea vomiting she may need to go to the emergency room in May needs to be admitted to the hospital for jejunostomy tube and IV fluids. I plan to see her back again next week as planned before, I spent more than 35 minutes including 25 minutes counseling time and also discuss with Dr. Freddrick March    March 19, 2017  I'm happy to see that patient tolerated laparoscopic jejunostomy tube placement and currently tolerating the tube feeding very well. I have reviewed the labs and will proceed with second course of chemotherapy with FOLFOX at this time. I also used emend to prevent nausea so so with chemo therapy. I'm happy to see that patient has no involvement of the omentum or the peritoneum. I still believe she'll be a candidate for curative surgery if she has lost want to chemotherapy well. I will continue FOLFOX chemotherapy every 2 weeks and see her back again in 4 weeks with CBC CMP for follow-up. I spent more than 40 minutes including 30 minutes counseling time and answered all their questions to their satisfaction

## 2017-03-19 NOTE — Progress Notes
uterine fibroids   ? LASIK     ? MASTECTOMY  2011   Diagnostic laparoscopy and laparoscopic feeding jejunostomy tube placement 03/13/2017    Social History:    Social History     Social History   ? Marital status: Single     Spouse name: N/A   ? Number of children: 0   ? Years of education: N/A     Occupational History   ? Not on file.     Social History Main Topics   ? Smoking status: Former Smoker     Packs/day: 1.00     Years: 15.00     Types: Cigarettes     Start date: 06/24/1974     Quit date: 11/29/1988   ? Smokeless tobacco: Never Used   ? Alcohol use Yes   ? Drug use: Yes     Frequency: 7.0 times per week     Types: Marijuana   ? Sexual activity: Not on file     Other Topics Concern   ? Not on file     Social History Narrative   ? No narrative on file       Family History:    Family History   Problem Relation Age of Onset   ? Breast Cancer Mother    ? Prostate Cancer Father    ? Colon Cancer Father        Vital Signs: There were no vitals filed for this visit.  There were no vitals filed for this visit.    Allergies: Patient has no known allergies.    Review of Systems:    Constitutional: Moderate weight loss, moderate fatigue, No fever  Eyes: No double vision  Ears,Nose, Mouth,Throat: No sinus trouble, No sore throat  Cardiac: No lightheadedness, No swelling in legs, No chest pain  Respiratory: No cough, No hemoptysis, No shortness of breath  Gastrointestinal: Increasing nausea vomiting  No diarrhea, No abdominal pain. Mild dysphagia, jejunostomy tube in place  Genitourinary: No burning on urination  Musculosketal: No bone pain.   Skin: No skin rash  Neurological: No seizures, No loss of balance, No weakness of limbs  Psychiatric: Deferred  Hematologic/Lymphatic/Immunologic: No bleeding, No lumps in arm pits  March 19, 2017, 12 system review done as above    Medications:   Current Outpatient Prescriptions:   ?  ALPRAZolam (XANAX) 0.5 MG PO Tablet, TK 1 T PO QHS, Disp: , Rfl: 2

## 2017-03-19 NOTE — Progress Notes
?   Monocytes Absolute 03/13/2017 0.75    ? Eosinophils Absolute 03/13/2017 0.19    ? Basophil Absolute 03/13/2017 0.03    ? Absolute Immature Granul* 03/13/2017 0.04*   ? Basophils % 03/13/2017 0.4    Office Visit on 02/19/2017   Component Date Value   ? WHITE BLOOD CELL COUNT 02/19/2017 9.1    ? RBC 02/19/2017 4.00    ? Hemoglobin 02/19/2017 13.1    ? Hematocrit 02/19/2017 37.4    ? MCV 02/19/2017 93.5    ? [facility] 02/19/2017 32.8    ? MCHC 02/19/2017 35.0    ? RDW 02/19/2017 11.7    ? Platelets 02/19/2017 256    ? MPV 02/19/2017 10.4    ? Neutrophils Absolute 02/19/2017 5041    ? Lymphocytes Absolute 02/19/2017 2967    ? Monocytes Absolute 02/19/2017 919    ? Eosinophils Absolute 02/19/2017 127    ? Basophils Absolute 02/19/2017 46    ? Neutrophils 02/19/2017 55.4    ? LYMPHOCYTES 02/19/2017 32.6    ? MONOCYTES 02/19/2017 10.1    ? EOSINOPHILS 02/19/2017 1.4    ? BASOPHILS 02/19/2017 0.5    ? Glucose 02/19/2017 80    ? Urea Nitrogen 02/19/2017 21    ? Creatinine 02/19/2017 0.73    ? EGFR 02/19/2017 91    ? Glom Filt Rate, Est Afri* 02/19/2017 105    ? BUN/Creatinine Ratio 17/49/4496 NOT APPLICABLE    ? Sodium 02/19/2017 140    ? Potassium 02/19/2017 4.4    ? Chloride 02/19/2017 104    ? CARBON DIOXIDE 02/19/2017 25    ? Calcium 02/19/2017 9.7    ? Protein, Total 02/19/2017 6.4    ? ALBUMIN 02/19/2017 4.5    ? Globulin 02/19/2017 1.9    ? ALBUMIN/GLOBULIN RATIO 02/19/2017 2.4    ? Total Bilirubin 02/19/2017 0.5    ? Alkaline Phosphatase 02/19/2017 66    ? AST 02/19/2017 14    ? ALT 02/19/2017 11    ? LD 02/19/2017 124    ? CEA 02/19/2017 1.5      02/19/2017 CBC, CMP grossly normal, CEA 1.5, LDH 124  03/19/2017 CBC grossly normal except hemoglobin 11.5 CMP within normal limits, reviewed with the patient today March 19, 2017    5/4/2018A. 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 SEE COMMENT    B. ANTRUM (ENDOSCOPIC BIOPSY):

## 2017-03-19 NOTE — Progress Notes
a wellness check in March 2018 and this was completely negative. The patient also has history of breast cancer in 2010 and she is status post right mastectomy with breast reconstruction. She did not receive chemotherapy but she was treated with Herceptin for 50 months by an oncologist in [place]. According to her report she could not tolerate tamoxifen. In 2016 she had an episode of vertigo and CT scan of the head showed dural thickness.    The patient has never noticed blood in the stool or in the urine, she never had the yellow jaundice, she never had hepatitis or blood transfusions. She has lost approximately 20 pounds since December 2017. She has difficulties with nutrition especially with solid food, as she has to cut the food in small pieces and chew for long time and she always gets pain postprandially.    The patient used to be a smoker for 10 years but she quit in 1989 and she smoked approximately one pack per day. She used to drink wine.    Regarding her family history her father has dementia and heart disease. Her mother had breast cancer at the age of 14 her great-grandmother also had breast cancer. There is no ovarian cancer in the family. Her father has history of prostate cancer and colon cancer. Her paternal grandmother had also colon cancer.    03/04/2017    Ms. 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 The patient started having severe acid reflux syndrome in November 2017 and she ended up having an endoscopy on 01/28/2017 with also endoscopic ultrasound on 02/08/2017. Results of the pathology by biopsy of a fungating mass were discussed as above. According to the patient's report the tumor was HER-2 negative. She was initially evaluated by medical oncologist at [facility] but

## 2017-03-21 ENCOUNTER — Inpatient Hospital Stay: Admit: 2017-03-21 | Discharge: 2017-03-22 | Primary: Internal Medicine

## 2017-03-21 DIAGNOSIS — Z87891 Personal history of nicotine dependence: Secondary | ICD-10-CM

## 2017-03-21 DIAGNOSIS — C160 Malignant neoplasm of cardia: Principal | ICD-10-CM

## 2017-03-21 DIAGNOSIS — Z9889 Other specified postprocedural states: Secondary | ICD-10-CM

## 2017-03-21 DIAGNOSIS — C169 Malignant neoplasm of stomach, unspecified: Principal | ICD-10-CM

## 2017-03-21 DIAGNOSIS — R634 Abnormal weight loss: Secondary | ICD-10-CM

## 2017-03-21 DIAGNOSIS — K219 Gastro-esophageal reflux disease without esophagitis: Secondary | ICD-10-CM

## 2017-03-21 DIAGNOSIS — Z9010 Acquired absence of unspecified breast and nipple: Secondary | ICD-10-CM

## 2017-03-21 DIAGNOSIS — Z79899 Other long term (current) drug therapy: Secondary | ICD-10-CM

## 2017-03-21 MED ORDER — FAMOTIDINE IVPB JX
20 mg | Freq: Once | INTRAVENOUS | Status: CP
Start: 2017-03-21 — End: ?

## 2017-03-21 MED ORDER — SODIUM CHLORIDE 0.9 % IV SOLN
Freq: Once | INTRAVENOUS | Status: CN
Start: 2017-03-21 — End: ?

## 2017-03-21 MED ORDER — HEPARIN SODIUM LOCK FLUSH 100 UNIT/ML IV SOLN
500 [IU] | Status: DC | PRN
Start: 2017-03-21 — End: 2017-03-21

## 2017-03-21 MED ORDER — SODIUM CHLORIDE 0.9 % IV SOLN
Freq: Once | INTRAVENOUS | Status: CP
Start: 2017-03-21 — End: ?

## 2017-03-21 MED ORDER — HEPARIN SODIUM LOCK FLUSH 100 UNIT/ML IV SOLN
500 [IU] | Status: DC | PRN
Start: 2017-03-21 — End: 2017-03-22

## 2017-03-21 MED ORDER — FAMOTIDINE IVPB JX
20 mg | Freq: Once | INTRAVENOUS | Status: CN
Start: 2017-03-21 — End: ?

## 2017-03-21 MED ORDER — SODIUM CHLORIDE FLUSH 0.9 % IV SOLN
10 mL | Status: DC | PRN
Start: 2017-03-21 — End: 2017-03-22

## 2017-03-21 MED ORDER — LIDOCAINE-PRILOCAINE 2.5-2.5 % EX CREA
1 refills | Status: CP
Start: 2017-03-21 — End: ?

## 2017-03-21 NOTE — Progress Notes
Patient arrived to clinic with CADD pump in place, on schedule to remove pump and d/c port. RN assessed site while taking vitals, BP low 89/55 patient stated this is "normal". Port flushed and heparin locked, d/c without complications.   As patient began to leave questions arose r/t scheduled hydration, this RN collaborated with RN/Carol as well as Dr. Loni Muse to confirm hydration could be administered. RN discussed options with patient; Ms. Monsivais opted to stay in clinic and re accessed to receive hydration d/t s/s of dehydration.

## 2017-03-27 ENCOUNTER — Other Ambulatory Visit: Payer: Self-pay | Admitting: Oncology

## 2017-04-01 ENCOUNTER — Other Ambulatory Visit

## 2017-04-01 DIAGNOSIS — C169 Malignant neoplasm of stomach, unspecified: Principal | ICD-10-CM

## 2017-04-01 MED ORDER — LEUCOVORIN IVPB JX
400 mg/m2 | Freq: Once | INTRAVENOUS | Status: CN
Start: 2017-04-01 — End: ?

## 2017-04-01 MED ORDER — FLUOROURACIL IV SYRINGE BCN JX
400 mg/m2 | Freq: Once | INTRAVENOUS | Status: CN
Start: 2017-04-01 — End: ?

## 2017-04-01 MED ORDER — FOSAPREPITANT IVPB JX
150 mg | Freq: Once | INTRAVENOUS | Status: CN
Start: 2017-04-01 — End: ?

## 2017-04-01 MED ORDER — DEXAMETHASONE SODIUM PHOSPHATE 20 MG/5ML IJ SOLN
20 mg | INTRAVENOUS | Status: CN | PRN
Start: 2017-04-01 — End: ?

## 2017-04-01 MED ORDER — DISCONNECT MEDICATION INFUSION JX
1 | Freq: Once | Status: CN
Start: 2017-04-01 — End: ?

## 2017-04-01 MED ORDER — OXALIPLATIN INFUSION BCN JX
85 mg/m2 | Freq: Once | INTRAVENOUS | Status: CN
Start: 2017-04-01 — End: ?

## 2017-04-01 MED ORDER — HEPARIN SODIUM LOCK FLUSH 100 UNIT/ML IV SOLN
500 [IU] | Status: CN | PRN
Start: 2017-04-01 — End: ?

## 2017-04-01 MED ORDER — SODIUM CHLORIDE FLUSH 0.9 % IV SOLN
10 mL | Status: CN | PRN
Start: 2017-04-01 — End: ?

## 2017-04-01 MED ORDER — HYDROCORTISONE NA SUCCINATE PF 100 MG IJ SOLR
100 mg | INTRAVENOUS | Status: CN | PRN
Start: 2017-04-01 — End: ?

## 2017-04-01 MED ORDER — ONDANSETRON IVPB BCN JX
4 mg | Freq: Once | INTRAVENOUS | Status: CN
Start: 2017-04-01 — End: ?

## 2017-04-01 MED ORDER — CONNECT MEDICATION INFUSION JX
1 | Freq: Once | Status: CN
Start: 2017-04-01 — End: ?

## 2017-04-01 MED ORDER — DIPHENHYDRAMINE HCL 50 MG/ML IJ SOLN
25 mg | INTRAVENOUS | Status: CN | PRN
Start: 2017-04-01 — End: ?

## 2017-04-01 MED ORDER — DEXTROSE 5 % IV SOLN
Freq: Once | INTRAVENOUS | Status: CN
Start: 2017-04-01 — End: ?

## 2017-04-01 MED ORDER — MEPERIDINE HCL 25 MG/ML IJ SOLN
25 mg | INTRAVENOUS | Status: CN | PRN
Start: 2017-04-01 — End: ?

## 2017-04-01 MED ORDER — FLUOROURACIL INFUSION AMB PUMP BCN JX
2400 mg/m2 | Freq: Once | INTRAVENOUS | Status: CN
Start: 2017-04-01 — End: ?

## 2017-04-01 MED ORDER — DEXAMETHASONE IVPB BCN JX
20 mg | Freq: Once | INTRAVENOUS | Status: CN
Start: 2017-04-01 — End: ?

## 2017-04-01 MED ORDER — ONDANSETRON WITH DEXAMETHASONE IVPB (NON-SPECIFIED)
Freq: Once | INTRAVENOUS | Status: CN
Start: 2017-04-01 — End: ?

## 2017-04-02 ENCOUNTER — Inpatient Hospital Stay: Admit: 2017-04-02 | Discharge: 2017-04-03 | Primary: Internal Medicine

## 2017-04-02 DIAGNOSIS — C169 Malignant neoplasm of stomach, unspecified: Secondary | ICD-10-CM

## 2017-04-02 DIAGNOSIS — R9389 Abnormal findings on diagnostic imaging of other specified body structures: Principal | ICD-10-CM

## 2017-04-02 DIAGNOSIS — Z5111 Encounter for antineoplastic chemotherapy: Secondary | ICD-10-CM

## 2017-04-02 DIAGNOSIS — C160 Malignant neoplasm of cardia: Secondary | ICD-10-CM

## 2017-04-02 MED ORDER — DEXTROSE 5 % IV SOLN
Freq: Once | INTRAVENOUS | Status: CP
Start: 2017-04-02 — End: ?

## 2017-04-02 MED ORDER — FLUOROURACIL IV SYRINGE BCN JX
400 mg/m2 | Freq: Once | INTRAVENOUS | Status: CP
Start: 2017-04-02 — End: ?

## 2017-04-02 MED ORDER — FLUOROURACIL INFUSION AMB PUMP BCN JX
2400 mg/m2 | Freq: Once | INTRAVENOUS | Status: DC
Start: 2017-04-02 — End: 2017-04-03

## 2017-04-02 MED ORDER — HEPARIN SODIUM LOCK FLUSH 100 UNIT/ML IV SOLN
500 [IU] | Status: DC | PRN
Start: 2017-04-02 — End: 2017-04-03

## 2017-04-02 MED ORDER — FOSAPREPITANT IVPB JX
150 mg | Freq: Once | INTRAVENOUS | Status: CP
Start: 2017-04-02 — End: ?

## 2017-04-02 MED ORDER — OXALIPLATIN INFUSION BCN JX
85 mg/m2 | Freq: Once | INTRAVENOUS | Status: DC
Start: 2017-04-02 — End: 2017-04-03

## 2017-04-02 MED ORDER — ONDANSETRON WITH DEXAMETHASONE IVPB (NON-SPECIFIED)
Freq: Once | INTRAVENOUS | Status: CP
Start: 2017-04-02 — End: ?

## 2017-04-02 MED ORDER — LEUCOVORIN IVPB JX
400 mg/m2 | Freq: Once | INTRAVENOUS | Status: CP
Start: 2017-04-02 — End: ?

## 2017-04-02 NOTE — Addendum Note
Addended by: Mickle Plumb D on: 04/02/2017 09:36 AM     Modules accepted: Orders

## 2017-04-04 ENCOUNTER — Inpatient Hospital Stay: Admit: 2017-04-04 | Discharge: 2017-04-05 | Primary: Internal Medicine

## 2017-04-04 DIAGNOSIS — C169 Malignant neoplasm of stomach, unspecified: Principal | ICD-10-CM

## 2017-04-04 DIAGNOSIS — K219 Gastro-esophageal reflux disease without esophagitis: Secondary | ICD-10-CM

## 2017-04-04 DIAGNOSIS — R634 Abnormal weight loss: Secondary | ICD-10-CM

## 2017-04-04 MED ORDER — PROMETHAZINE IN NS IVPB JX
12.5 mg | Freq: Once | INTRAVENOUS | Status: CP
Start: 2017-04-04 — End: ?

## 2017-04-04 MED ORDER — SODIUM CHLORIDE 0.9 % IV SOLN
Freq: Once | INTRAVENOUS | Status: CN
Start: 2017-04-04 — End: ?

## 2017-04-04 MED ORDER — SODIUM CHLORIDE 0.9 % IV SOLN
Freq: Once | INTRAVENOUS | Status: CP
Start: 2017-04-04 — End: ?

## 2017-04-04 MED ORDER — HEPARIN SODIUM LOCK FLUSH 100 UNIT/ML IV SOLN
500 [IU] | Status: DC | PRN
Start: 2017-04-04 — End: 2017-04-04

## 2017-04-04 MED ORDER — HEPARIN SODIUM LOCK FLUSH 100 UNIT/ML IV SOLN
500 [IU] | Status: DC | PRN
Start: 2017-04-04 — End: 2017-04-05

## 2017-04-04 MED ORDER — FAMOTIDINE IVPB JX
20 mg | Freq: Once | INTRAVENOUS | Status: CN
Start: 2017-04-04 — End: ?

## 2017-04-04 MED ORDER — LORAZEPAM 2 MG/ML IJ SOLN
.5 mg | Freq: Once | INTRAVENOUS | Status: CP
Start: 2017-04-04 — End: ?

## 2017-04-05 ENCOUNTER — Inpatient Hospital Stay: Admit: 2017-04-05 | Discharge: 2017-04-06 | Primary: Internal Medicine

## 2017-04-05 DIAGNOSIS — R634 Abnormal weight loss: Secondary | ICD-10-CM

## 2017-04-05 DIAGNOSIS — C169 Malignant neoplasm of stomach, unspecified: Principal | ICD-10-CM

## 2017-04-05 DIAGNOSIS — F064 Anxiety disorder due to known physiological condition: Secondary | ICD-10-CM

## 2017-04-05 DIAGNOSIS — K219 Gastro-esophageal reflux disease without esophagitis: Secondary | ICD-10-CM

## 2017-04-05 MED ORDER — SODIUM CHLORIDE 0.9 % IV SOLN
Freq: Once | INTRAVENOUS | Status: CN
Start: 2017-04-05 — End: ?

## 2017-04-05 MED ORDER — HEPARIN SODIUM LOCK FLUSH 100 UNIT/ML IV SOLN
500 [IU] | Status: DC | PRN
Start: 2017-04-05 — End: 2017-04-06

## 2017-04-05 MED ORDER — FAMOTIDINE IVPB JX
20 mg | Freq: Once | INTRAVENOUS | Status: CN
Start: 2017-04-05 — End: ?

## 2017-04-05 MED ORDER — SODIUM CHLORIDE 0.9 % IV SOLN
Freq: Once | INTRAVENOUS | Status: CP
Start: 2017-04-05 — End: ?

## 2017-04-05 MED ORDER — PROMETHAZINE IN NS IVPB JX
12.5 mg | Freq: Once | INTRAVENOUS | Status: CP
Start: 2017-04-05 — End: ?

## 2017-04-05 MED ORDER — LORAZEPAM 2 MG/ML IJ SOLN
.5 mg | Freq: Once | INTRAVENOUS | Status: CP
Start: 2017-04-05 — End: ?

## 2017-04-05 NOTE — Progress Notes
04/05/17 13:02 Patient came in for fluids and nausea infusion today. She states that her caregiver noted a systolic BP in the 40'H overnight. Vitals today: initial BP 98/59, HR 63. Post infusion BP 117/70, HR 72. She is experiencing nausea. No active vomiting. Patient inquired about receiving Phenergan prescription for home use. She currently has Zofran ODT 8mg  and Compazine 10mg , which she states is effective. Patient stated to RN that she wanted something to help her sleep. She currently has Lunesta 3mg , but patient states she does not take often. RN phoned Dr. Lurena Joiner and reviewed current home medications. Per MD, have patient utilize her current home medications. Per MD, have her take the zofran and compazine for nausea, alternating use. Have her take Lunesta at night to sleep. Have her take xanax in the morning. RN explained to patient and her sister. Both verbalize understanding. Patient will utilize the above home medications as prescribed and advise of effectiveness in the future.(AD,RN,OCN)

## 2017-04-05 NOTE — Progress Notes
04/05/17 13:02 Patient came in for fluids and nausea infusion today. She states that her caregiver noted a systolic BP in the 83'J overnight. Vitals today: initial BP 98/59, HR 63. Post infusion BP 117/70, HR 72. She is experiencing nausea. No active vomiting. Patient inquired about receiving Phenergan prescription for home use. She currently has Zofran ODT 8mg  and Compazine 10mg , which she states is effective. Patient stated to RN that she wanted something to help her sleep. She currently has Lunesta 3mg , but patient states she does not take often. RN phoned Dr. Lurena Joiner and reviewed current home medications. Per MD, have patient utilize her current home medications. Per MD, have her take the zofran and compazine for nausea, alternating use. Have her take Lunesta at night to sleep. Have her take xanax in the morning. RN explained to patient and her sister. Both verbalize understanding. Patient will utilize the above home medications as prescribed and advise of effectiveness in the future.

## 2017-04-08 ENCOUNTER — Ambulatory Visit: Admit: 2017-04-08 | Discharge: 2017-04-08 | Attending: Hematology & Oncology | Primary: Internal Medicine

## 2017-04-08 DIAGNOSIS — Z791 Long term (current) use of non-steroidal anti-inflammatories (NSAID): Secondary | ICD-10-CM

## 2017-04-08 DIAGNOSIS — C169 Malignant neoplasm of stomach, unspecified: Secondary | ICD-10-CM

## 2017-04-08 DIAGNOSIS — Z87891 Personal history of nicotine dependence: Secondary | ICD-10-CM

## 2017-04-08 DIAGNOSIS — Z79899 Other long term (current) drug therapy: Secondary | ICD-10-CM

## 2017-04-08 DIAGNOSIS — E43 Unspecified severe protein-calorie malnutrition: Principal | ICD-10-CM

## 2017-04-08 DIAGNOSIS — K769 Liver disease, unspecified: Secondary | ICD-10-CM

## 2017-04-08 DIAGNOSIS — F1299 Cannabis use, unspecified with unspecified cannabis-induced disorder: Secondary | ICD-10-CM

## 2017-04-08 DIAGNOSIS — Z79891 Long term (current) use of opiate analgesic: Secondary | ICD-10-CM

## 2017-04-08 MED ORDER — HYDROCODONE-ACETAMINOPHEN 10-325 MG PO TABS
ORAL_TABLET | 0 refills
Start: 2017-04-08 — End: ?

## 2017-04-08 MED ORDER — QUETIAPINE FUMARATE 50 MG PO TABS
0 refills
Start: 2017-04-08 — End: ?

## 2017-04-09 ENCOUNTER — Encounter: Primary: Internal Medicine

## 2017-04-09 ENCOUNTER — Inpatient Hospital Stay: Admit: 2017-04-09 | Discharge: 2017-04-09

## 2017-04-09 DIAGNOSIS — R1310 Dysphagia, unspecified: Secondary | ICD-10-CM

## 2017-04-09 DIAGNOSIS — K9413 Enterostomy malfunction: Secondary | ICD-10-CM

## 2017-04-09 DIAGNOSIS — F419 Anxiety disorder, unspecified: Secondary | ICD-10-CM

## 2017-04-09 DIAGNOSIS — D219 Benign neoplasm of connective and other soft tissue, unspecified: Secondary | ICD-10-CM

## 2017-04-09 DIAGNOSIS — R1013 Epigastric pain: Secondary | ICD-10-CM

## 2017-04-09 DIAGNOSIS — F1290 Cannabis use, unspecified, uncomplicated: Secondary | ICD-10-CM

## 2017-04-09 DIAGNOSIS — Z934 Other artificial openings of gastrointestinal tract status: Secondary | ICD-10-CM

## 2017-04-09 DIAGNOSIS — K219 Gastro-esophageal reflux disease without esophagitis: Secondary | ICD-10-CM

## 2017-04-09 DIAGNOSIS — C169 Malignant neoplasm of stomach, unspecified: Secondary | ICD-10-CM

## 2017-04-09 DIAGNOSIS — E039 Hypothyroidism, unspecified: Secondary | ICD-10-CM

## 2017-04-09 DIAGNOSIS — R634 Abnormal weight loss: Secondary | ICD-10-CM

## 2017-04-09 DIAGNOSIS — K9423 Gastrostomy malfunction: Principal | ICD-10-CM

## 2017-04-09 DIAGNOSIS — Z79891 Long term (current) use of opiate analgesic: Secondary | ICD-10-CM

## 2017-04-09 DIAGNOSIS — M1990 Unspecified osteoarthritis, unspecified site: Secondary | ICD-10-CM

## 2017-04-09 DIAGNOSIS — C50911 Malignant neoplasm of unspecified site of right female breast: Secondary | ICD-10-CM

## 2017-04-09 DIAGNOSIS — Z87891 Personal history of nicotine dependence: Secondary | ICD-10-CM

## 2017-04-09 DIAGNOSIS — Z79899 Other long term (current) drug therapy: Secondary | ICD-10-CM

## 2017-04-09 DIAGNOSIS — R9389 Abnormal findings on diagnostic imaging of other specified body structures: Secondary | ICD-10-CM

## 2017-04-09 DIAGNOSIS — K769 Liver disease, unspecified: Secondary | ICD-10-CM

## 2017-04-09 NOTE — Progress Notes
patient also has history of breast cancer in 2010 and she is status post right mastectomy with breast reconstruction. She did not receive chemotherapy but she was treated with Herceptin for 50 months by an oncologist in [place]. According to her report she could not tolerate tamoxifen. In 2016 she had an episode of vertigo and CT scan of the head showed dural thickness.    The patient has never noticed blood in the stool or in the urine, she never had the yellow jaundice, she never had hepatitis or blood transfusions. She has lost approximately 20 pounds since December 2017. She has difficulties with nutrition especially with solid food, as she has to cut the food in small pieces and chew for long time and she always gets pain postprandially.    The patient used to be a smoker for 10 years but she quit in 1989 and she smoked approximately one pack per day. She used to drink wine.    Regarding her family history her father has dementia and heart disease. Her mother had breast cancer at the age of 50 her great-grandmother also had breast cancer. There is no ovarian cancer in the family. Her father has history of prostate cancer and colon cancer. Her paternal grandmother had also colon cancer.    03/04/2017    Ms. 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 The patient started having severe acid reflux syndrome in November 2017 and she ended up having an endoscopy on 01/28/2017 with also endoscopic ultrasound on 02/08/2017. Results of the pathology by biopsy of a fungating mass were discussed as above. According to the patient's report the tumor was HER-2 negative. She was initially evaluated by medical oncologist at [facility] but

## 2017-04-09 NOTE — Progress Notes
?    HYDROcodone-acetaminophen (NORCO) 10-325 MG PO Tablet, TK 1 T PO Q 6 H PRN, Disp: , Rfl: 0  ?  HYDROcodone-acetaminophen (NORCO) 5-325 MG PO Tablet, TK 1-2 TS PO Q 6 H, Disp: , Rfl: 0  ?  ibuprofen (ADVIL,MOTRIN) 600 MG PO Tablet, Take 1 tablet by mouth 3 times daily for 5 days., Disp: 15 tablet, Rfl: 0  ?  lidocaine-prilocaine (EMLA) 2.5-2.5 % EX Cream, Apply 30 minutes prior to treatment, Disp: 60 g, Rfl: 1  ?  omeprazole (PriLOSEC) 40 MG PO Capsule Delayed Release, TK 1 C PO QD AC, Disp: , Rfl: 5  ?  ondansetron (ZOFRAN-ODT) 8 MG PO Tablet Disintegrating, Dissolve 1 tablet in mouth every 8 hours as needed for nausea., Disp: 30 tablet, Rfl: 11  ?  prochlorperazine (COMPAZINE) 10 MG PO Tablet, Take 1 tablet by mouth every 6 hours as needed., Disp: 60 tablet, Rfl: 11  ?  QUEtiapine (SEROquel) 50 MG PO Tablet, , Disp: , Rfl: 0  ?  tiZANidine (ZANAFLEX) 4 MG PO Tablet, TK 1 T PO QHS PRN, Disp: , Rfl: 0  ?  traMADol (ULTRAM) 50 MG PO Tablet, TK 1 T PO Q 6 H PRF PAIN, Disp: , Rfl: 0      Labs:   Hospital Outpatient Visit on 04/02/2017   Component Date Value   ? WBC 04/02/2017 8.32    ? RBC 04/02/2017 3.52    ? Hemoglobin 04/02/2017 11.0*   ? Hematocrit 04/02/2017 33.1*   ? MCV 04/02/2017 94.0    ? [place] 04/02/2017 31.3    ? MCHC 04/02/2017 33.2    ? RDW 04/02/2017 12.6    ? Platelet Count 04/02/2017 196    ? MPV 04/02/2017 10.3    ? Neutrophils Absolute 04/02/2017 5.30    ? Neutrophils % 04/02/2017 63.6    ? Lymphocytes Absolute 04/02/2017 1.67    ? Lymphocytes % 04/02/2017 20.1*   ? Monocytes Absolute 04/02/2017 1.13    ? Monocytes % 04/02/2017 13.6    ? Eosinophils Absolute 04/02/2017 0.18    ? Eosinophils % 04/02/2017 2.2    ? Basophil Absolute 04/02/2017 0.04    ? BASOPHILS 04/02/2017 0.5    Lab Requisition on 04/01/2017   Component Date Value   ? Materials Received  04/01/2017                      Value:This result contains rich text formatting which cannot be displayed here.   ? Case Report 04/01/2017

## 2017-04-09 NOTE — ED Provider Notes
History     Chief Complaint   Patient presents with   ? Feeding Tube Malfunction       61 year old female with complaint of a J tube clogged.  She has a history of a gastricadenocarcinoma.  She called general surgery, Dr. Freddrick March, advised going to ED for a non-emergent visit for a replacement of her Jtube (12 fr).  She reports no fever, drainage, or erythema at the site.  She had the J tube placed 1 month ago.      The history is provided by the patient. No language interpreter was used.       No Known Allergies    Patient's Medications   New Prescriptions    No medications on file   Previous Medications    ALPRAZOLAM (XANAX) 0.5 MG PO TABLET    TK 1 T PO QHS    AMOXICILLIN-CLAVULANATE (AUGMENTIN) 400-57 MG PO TABLET CHEWABLE    Chew 1 tablet 2 times daily for 1 day.    ARMOUR THYROID 15 MG PO TABLET    TK 3 TS PO ONCE D OES    CARAFATE 1 GM/10ML PO SUSPENSION    TK 10 ML PO TID OES 1 HOUR B MEALS AND HS    DICLOFENAC (VOLTAREN GEL) 1 % TD GEL    APP AA BID UTD    ESZOPICLONE (LUNESTA) 3 MG PO TABLET        HYDROCODONE-ACETAMINOPHEN (NORCO) 10-325 MG PO TABLET    TK 1 T PO Q 6 H PRN    HYDROCODONE-ACETAMINOPHEN (NORCO) 5-325 MG PO TABLET    TK 1-2 TS PO Q 6 H    IBUPROFEN (ADVIL,MOTRIN) 600 MG PO TABLET    Take 1 tablet by mouth 3 times daily for 5 days.    LIDOCAINE-PRILOCAINE (EMLA) 2.5-2.5 % EX CREAM    Apply 30 minutes prior to treatment    OMEPRAZOLE (PRILOSEC) 40 MG PO CAPSULE DELAYED RELEASE    TK 1 C PO QD AC    ONDANSETRON (ZOFRAN-ODT) 8 MG PO TABLET DISINTEGRATING    Dissolve 1 tablet in mouth every 8 hours as needed for nausea.    PROCHLORPERAZINE (COMPAZINE) 10 MG PO TABLET    Take 1 tablet by mouth every 6 hours as needed.    QUETIAPINE (SEROQUEL) 50 MG PO TABLET       Modified Medications    No medications on file   Discontinued Medications    BIMATOPROST (LATISSE) 0.03 % EX SOLUTION        BUPROPION HCL (WELLBUTRIN XL) 300 MG PO TABLET EXTENDED RELEASE 24 HOUR    TK 1 T   PO IN THE MORNING ONCE D

## 2017-04-09 NOTE — Progress Notes
?   Globulin 03/19/2017 1.9    ? ALBUMIN/GLOBULIN RATIO 03/19/2017 2.0    ? Total Bilirubin 03/19/2017 0.3    ? Alkaline Phosphatase 03/19/2017 62    ? AST 03/19/2017 24    ? ALT 03/19/2017 30*   Office Visit on 02/19/2017   Component Date Value   ? WHITE BLOOD CELL COUNT 02/19/2017 9.1    ? RBC 02/19/2017 4.00    ? Hemoglobin 02/19/2017 13.1    ? Hematocrit 02/19/2017 37.4    ? MCV 02/19/2017 93.5    ? [facility] 02/19/2017 32.8    ? MCHC 02/19/2017 35.0    ? RDW 02/19/2017 11.7    ? Platelets 02/19/2017 256    ? MPV 02/19/2017 10.4    ? Neutrophils Absolute 02/19/2017 5041    ? Lymphocytes Absolute 02/19/2017 2967    ? Monocytes Absolute 02/19/2017 919    ? Eosinophils Absolute 02/19/2017 127    ? Basophils Absolute 02/19/2017 46    ? Neutrophils 02/19/2017 55.4    ? LYMPHOCYTES 02/19/2017 32.6    ? MONOCYTES 02/19/2017 10.1    ? EOSINOPHILS 02/19/2017 1.4    ? BASOPHILS 02/19/2017 0.5    ? Glucose 02/19/2017 80    ? Urea Nitrogen 02/19/2017 21    ? Creatinine 02/19/2017 0.73    ? EGFR 02/19/2017 91    ? Glom Filt Rate, Est Afri* 02/19/2017 105    ? BUN/Creatinine Ratio 33/29/5188 NOT APPLICABLE    ? Sodium 02/19/2017 140    ? Potassium 02/19/2017 4.4    ? Chloride 02/19/2017 104    ? CARBON DIOXIDE 02/19/2017 25    ? Calcium 02/19/2017 9.7    ? Protein, Total 02/19/2017 6.4    ? ALBUMIN 02/19/2017 4.5    ? Globulin 02/19/2017 1.9    ? ALBUMIN/GLOBULIN RATIO 02/19/2017 2.4    ? Total Bilirubin 02/19/2017 0.5    ? Alkaline Phosphatase 02/19/2017 66    ? AST 02/19/2017 14    ? ALT 02/19/2017 11    ? LD 02/19/2017 124    ? CEA 02/19/2017 1.5      02/19/2017 CBC, CMP grossly normal, CEA 1.5, LDH 124  03/19/2017 CBC grossly normal except hemoglobin 11.5 CMP within normal limits, reviewed with the patient today March 19, 2017  04/02/2017 CBC grossly normal except hemoglobin 11.0 CMP grossly normal, reviewed with the patient and her sister today April 08, 2017 ,       5/4/2018A. GASTRIC BODY, ABNORMAL MUCOSA (ENDOSCOPIC BIOPSY):

## 2017-04-09 NOTE — ED Provider Notes
Constitutional: Negative for fever and fatigue.   Eyes: Negative for pain and discharge.   Respiratory: Negative for chest tightness and shortness of breath.    Cardiovascular: Negative for chest pain and palpitations.   Gastrointestinal: Negative for nausea, vomiting and diarrhea.   Genitourinary: Negative for dysuria and flank pain.   Musculoskeletal: Negative for back pain, joint swelling and gait problem.   Skin: Negative for color change and wound.   Neurological: Negative for dizziness and headaches.   Psychiatric/Behavioral: Negative for agitation and anxiety.   All other systems reviewed and are negative.      Physical Exam     ED Triage Vitals   BP    Pulse    Resp    Temp    Temp src    Height    Weight    SpO2    BMI (Calculated)              Physical Exam   Constitutional: She is oriented to person, place, and time. She appears well-developed and well-nourished. No distress.   HENT:   Head: Normocephalic and atraumatic.   Nose: Nose normal.   Mouth/Throat: Oropharynx is clear and moist.   Eyes: Conjunctivae and EOM are normal. Pupils are equal, round, and reactive to light.   Neck: Normal range of motion. Neck supple. No tracheal deviation present.   Cardiovascular: Normal rate and regular rhythm.    Pulmonary/Chest: Effort normal and breath sounds normal. No stridor. No respiratory distress.   Abdominal: Soft. There is no tenderness.       Musculoskeletal: She exhibits no edema.   Neurological: She is alert and oriented to person, place, and time. She has normal strength. She displays no atrophy. No cranial nerve deficit or sensory deficit. She exhibits normal muscle tone. She displays a negative Romberg sign. Coordination and gait normal.   Skin: Skin is warm and dry. No rash noted. She is not diaphoretic.   Psychiatric: She has a normal mood and affect. Her behavior is normal. Judgment and thought content normal.   Nursing note and vitals reviewed.      Differential DDx: ***

## 2017-04-09 NOTE — ED Notes
Time of discharge: 1648  PM., Patient discharged to  Home.  Patient discharged  ambulatory. to exit with belongings in  Stable condition.  Patient escorted by  family., Written discharge instructions given to  patient.  Patient/recipient  verbalizes discharge instructions.

## 2017-04-09 NOTE — Progress Notes
Mariah Cameron to see whether she had molecular analysis sent  Interim medical history, surgical history, family history, social history were reviewed, no major change from last visit June 2018   March 07, 2017, chief complaints  Patient is recently found to have poorly differentiated carcinoma of the GE junction, T3 N3 M0, start her on chemotherapy with FOLFOX.  Patient is here complains of increasing nausea and vomiting, not able to keep anything down., She is having obstructive symptoms., She had fluids yesterday as well. Denies any fever or chills or cough or chest pain feeling very tired and fatigued lost 2-3 pounds last few days, I have discussed with Dr. 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 Patient denies any fever or chills cough chest pain or shortness of breath. Eating only very minimal using the J-tube. 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

## 2017-04-09 NOTE — Progress Notes
Dr. Freddrick March also talked to the patient to phone regarding the need of jejunostomy tube. Patient understand the situation and she is willing to proceed with the jejunostomy tube. Dr. Vilinda Blanks will make appropriate arrangements. I will give IV fluid today and tomorrow and advised to drink protein drinks as much she can. I also twice her that in today to have nausea vomiting she may need to go to the emergency room in May needs to be admitted to the hospital for jejunostomy tube and IV fluids. I plan to see her back again next week as planned before, I spent more than 35 minutes including 25 minutes counseling time and also discuss with Dr. Freddrick March    March 19, 2017  I'm happy to see that patient tolerated laparoscopic jejunostomy tube placement and currently tolerating the tube feeding very well. I have reviewed the labs and will proceed with second course of chemotherapy with FOLFOX at this time. I also used emend to prevent nausea so so with chemo therapy. I'm happy to see that patient has no involvement of the omentum or the peritoneum. I still believe she'll be a candidate for curative surgery if she has lost want to chemotherapy well. I will continue FOLFOX chemotherapy every 2 weeks and see her back again in 4 weeks with CBC CMP for follow-up. I spent more than 40 minutes including 30 minutes counseling time and answered all their questions to their satisfaction also reviewed the recent hospitalization for surgery as well    April 09, 2017   Labs were reviewed today and elected to continue with chemotherapy next week she will receive the day 15 of course 2 of chemotherapy with FOLFOX. . Advised to use the jejunostomy tube frequently also flush it with water to prevent dehydration . The patient wants to she will try to drink soups as well. . I plan to see her back again in 2-3 weeks for follow-up with CBC and CMP. After 6 treatments I plan to repeat a PET CT scan for

## 2017-04-09 NOTE — Progress Notes
Rogene Houston, [place] [address]    No chief complaint on file.  The patient has been referred to this clinic for workup and evaluation and treatment of adenocarcinoma of the GE junction.    Patient Name: Mariah Cameron      Date Of Birth: 12-23-1955    History of Present Illness:    Date: 02/19/2017    Ms. 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 The patient started having severe acid reflux syndrome in November 2017 and she ended up having an endoscopy on 01/28/2017 with also endoscopic ultrasound on 02/08/2017. Results of the pathology by biopsy of a fungating mass were discussed as above. According to the patient's report the tumor was HER-2 negative. She was initially evaluated by medical oncologist at [facility] but she preferred to transfer her treatment to this facility and also to the [facility] and she has seen Dr. Freddrick March also as of yesterday, who recommended an MRI of the liver as well as neoadjuvant chemotherapy. I do not have results of her PET/CT scan but the patient told me verbally that this was negative for liver lesions, initially described at a recent CT scan of the upper and lower abdomen. There was no other distant metastasis but there was lymphadenopathy associated with the stomach cancer. The patient is scheduled to undergo a Mediport placement by Dr. Dara Lords as of Friday 02/22/2017. She was suggested to have FOLFOX chemotherapy as soon as possible.    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 She had a wellness check in March 2018 and this was completely negative. The

## 2017-04-09 NOTE — ED Triage Notes
States feeding tube has become clogged.  Had a continuous feeding during the night without problem until she awoke this morning.

## 2017-04-09 NOTE — Progress Notes
follow-up I spent more than 40 minutes including 30 minutes counseling time and answered all their questions to their satisfaction   For rest of her medical problem and continue with current management and follow with her primary physician. I have encouraged her to call me directly if she has any questions or if I can be of any further assistance. Results of most recent studies discussed with patient. All questions were answered to patient's satisfaction. Patient agreeable to the above plan. More than 50% of that time was spent on coordination of care, counseling, further diagnostic and therapeutic clinic and follow-up care. This document was created using voice recognition software. Inadvertent typographical errors, word omissions and/or word substitutions may exist.       Return to clinic, Test ordered, Chemo ordered FOLFOX, Review/Obtain old records, Diagnosis and Care Plan discussed, Reviewed NCCN Guidelines , Prognostic/predictive markers reviewed, Treatment Goal-Disease Free Survival, Literature review, Risk/Treatment Benefit, Stable/Treatment ordered, Chemo Education, Side effect reviewed, Bone involvement/Bisphosphonates, Reviewed contacting office//after hours contact, Chemotherapy plan reviewed, Restaging Plan, Alternative Options discussed, Supportive Care Measures discussed, Dehydration/IV Fluids, Radiology Review, Mediport placement, All questions answered, Counseling/Coordination of Care and Prescription renewals    Return to Clinic:  in 4 weeks.  Dr. Rosaura Carpenter,  Dr. Carmelina Paddock, Dr. Freddrick March  .

## 2017-04-09 NOTE — Progress Notes
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 SEE COMMENT    B. ANTRUM (ENDOSCOPIC BIOPSY):   -  MILD CHRONIC GASTRITIS WITH MINIMAL ACTIVITY.   -  NO HELICOBACTER PYLORI IDENTIFIED BY MORPHOLOGICAL EVALUATION.   -  NO INTESTINAL METAPLASIA, DYSPLASIA OR MALIGNANCY IDENTIFIED.    C. POLYP DESCENDING (ENDOSCOPIC BIOPSY):   -  TUBULAR ADENOMA   -  NEGATIVE FOR HIGH-GRADE DYSPLASIA AND MALIGNANCY  HER2 Non-Breast: NEGATIVE  Score: 1+    02/08/2017. LIVER (FINE NEEDLE ASPIRATION):   -  NO EVIDENCE OF MALIGNANCY IDENTIFIED.    -  SMALL DETACHED FRAGMENTS OF LIVER PARENCHYMA AND SUPERFICIAL GASTRIC        FOVEOLAR EPITHELIUM.    Imaging Results:     02/14/2017, PET/CT scan showed FDG uptake in the anterior wall of the antrum consistent with malignancy, 1.87 with a lymph node within the stomach and liver, no obvious liver metastasis  02/21/2017, MRI of the abdomen there is 4-5 mm nonenhancing lesion noted within the left lobe of the liver which were present and atypical cyst or small hemangioma no obvious additional lesions are seen within the liver, there is a mass within the stomach consistent with patient's history of stomach cancer  I have reviewed these reports with the patient today March 04, 2017  EUS, staged as T3 N3, M1 although biopsy of the liver negative for malignancy as his MRI and the PET scan    Physical Exam:   Physical examination  Patient is alert, not in any acute distress , tired and fatigue, PS-0  HEENT, no pallor nor icterus, throat clear is a petechia  Neck, no lymphadenopathy, no thyromegaly, no lymphadenopathy  Respiratory system, bilateral mild wheezing no rhonchi or vascular  Cardiac, S1-S2 regular  Abdomen, soft nontender no hepatosplenomegaly, bowel sounds are active, no other mass palpable, the jejunostomy tube in place and functioning well  Musculoskeletal, no joint pain, except pain in the right foot with swelling  CNS, alert oriented ?3, able to more 4 extremities, no neurological

## 2017-04-09 NOTE — ED Provider Notes
DICLOFENAC SODIUM CR 100 MG PO TABLET EXTENDED RELEASE 24 HOUR    TK 1 T PO ONCE A DAY    DOK 100 MG PO CAPSULE    TK ONE C PO BID WITH PLENTY OF WATER    TIZANIDINE (ZANAFLEX) 4 MG PO TABLET    TK 1 T PO QHS PRN    TRAMADOL (ULTRAM) 50 MG PO TABLET    TK 1 T PO Q 6 H PRF PAIN       Past Medical History:   Diagnosis Date   ? Abnormal CT scan 02/05/2017    CT C/A/P w/ IV contrast   ? Anxiety disorder    ? Arthritis    ? 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 Chronic back pain    ? DJD (degenerative joint disease)     spine   ? Dysphagia    ? Epigastric pain     worsens with eating   ? Fibroids     uterine; s/p hysterectomy   ? 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 GERD (gastroesophageal reflux disease)    ? Hypothyroidism    ? Liver lesion, left lobe 02/04/2017   ? Stomach cancer    ? Weight loss, abnormal        Past Surgical History:   Procedure Laterality Date   ? BREAST RECONSTRUCTION  2016   ? CATARACT EXTRACTION W/ INTRAOCULAR LENS IMPLANT     ? COLONOSCOPY W/ BIOPSY  01/28/2017   ? EGD W/ BIOPSY  01/28/2017   ? EUS UPPER W/ FNA  02/08/2017   ? HYSTERECTOMY  2003    uterine fibroids   ? LASIK     ? MASTECTOMY  2011       Family History   Problem Relation Age of Onset   ? Breast Cancer Mother    ? Prostate Cancer Father    ? Colon Cancer Father        Social History     Social History   ? Marital status: Single     Spouse name: N/A   ? Number of children: 0   ? Years of education: N/A     Social History Main Topics   ? Smoking status: Former Smoker     Packs/day: 1.00     Years: 15.00     Types: Cigarettes     Start date: 06/24/1974     Quit date: 11/29/1988   ? Smokeless tobacco: Never Used   ? Alcohol use Yes   ? Drug use: Yes     Frequency: 7.0 times per week     Types: Marijuana   ? Sexual activity: Not on file     Other Topics Concern   ? Not on file     Social History Narrative   ? No narrative on file

## 2017-04-09 NOTE — ED Provider Notes
QUETIAPINE (SEROQUEL) 50 MG PO TABLET        TIZANIDINE (ZANAFLEX) 4 MG PO TABLET    TK 1 T PO QHS PRN    TRAMADOL (ULTRAM) 50 MG PO TABLET    TK 1 T PO Q 6 H PRF PAIN   Modified Medications    No medications on file   Discontinued Medications    No medications on file       Past Medical History:   Diagnosis Date   ? Abnormal CT scan 02/05/2017    CT C/A/P w/ IV contrast   ? Anxiety disorder    ? Arthritis    ? 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 Chronic back pain    ? DJD (degenerative joint disease)     spine   ? Dysphagia    ? Epigastric pain     worsens with eating   ? Fibroids     uterine; s/p hysterectomy   ? 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 GERD (gastroesophageal reflux disease)    ? Hypothyroidism    ? Liver lesion, left lobe 02/04/2017   ? Weight loss, abnormal        Past Surgical History:   Procedure Laterality Date   ? BREAST RECONSTRUCTION  2016   ? CATARACT EXTRACTION W/ INTRAOCULAR LENS IMPLANT     ? COLONOSCOPY W/ BIOPSY  01/28/2017   ? EGD W/ BIOPSY  01/28/2017   ? EUS UPPER W/ FNA  02/08/2017   ? HYSTERECTOMY  2003    uterine fibroids   ? LASIK     ? MASTECTOMY  2011       Family History   Problem Relation Age of Onset   ? Breast Cancer Mother    ? Prostate Cancer Father    ? Colon Cancer Father        Social History     Social History   ? Marital status: Single     Spouse name: N/A   ? Number of children: 0   ? Years of education: N/A     Social History Main Topics   ? Smoking status: Former Smoker     Packs/day: 1.00     Years: 15.00     Types: Cigarettes     Start date: 06/24/1974     Quit date: 11/29/1988   ? Smokeless tobacco: Never Used   ? Alcohol use Yes   ? Drug use: Yes     Frequency: 7.0 times per week     Types: Marijuana   ? Sexual activity: Not on file     Other Topics Concern   ? Not on file     Social History Narrative   ? No narrative on file       Review of Systems

## 2017-04-09 NOTE — Progress Notes
she preferred to transfer her treatment to this facility and also to the [place] and she has seen Dr. Freddrick March also as of yesterday, who recommended an MRI of the liver as well as neoadjuvant chemotherapy. I do not have results of her PET/CT scan but the patient told me verbally that this was negative for liver lesions, initially described at a recent CT scan of the upper and lower abdomen. There was no other distant metastasis but there was lymphadenopathy associated with the stomach cancer. The patient is scheduled to undergo a Mediport placement by Dr. Dara Lords as of Friday 02/22/2017. She was suggested to have FOLFOX chemotherapy as soon as possible.  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 Patient couldn't tolerate tamoxifen, which has been discontinued. Patient also BRCA 1 and 2 negative in the past  Patient is here for the first course of chemotherapy with FOLFOX. She has many questions about her disease process as well as the biopsy results and also MRI results. I have discussed this in length with the patient and felt that currently she is only as stage III disease by MRI. And as per Dr. Freddrick March, surgeons recommendation I will proceed with FOLFOX chemotherapy at this time. Patient still has mild dysphagia but able to bring labs of protein supplements. Denies any fever or chills cough or chest pain or shortness of breath. I left a message with Dr. Robbie Lis, oncologist from [facility]

## 2017-04-09 NOTE — Progress Notes
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 The patient has a Mediport placed as of this Friday, 02/22/2017. I discussed today with her patient and her sister regarding her diagnosis, prognosis and treatments for this type of tumor. I explained in great detail the effects and side effects of chemotherapy especially neuropathy with oxaliplatin possible chest pain or discomfort with 5-FU, less of alopecia and no major other side effects. In general this tablet treatment is relatively well tolerated without excessive cytopenias. I prescribed antinausea medications in case of chemotherapy associated nausea and vomiting, to her local pharmacy. We did add toward with the patient and the sister of our chemotherapy unit and introduce her to our chemotherapy nurses. I have prescribed chemotherapy to start tentatively on Wednesday, Feb 27, 2017.    Given her heavy family history of breast cancer, colon cancer, prostate cancer, I have requested a genetic evaluation testing by myriad laboratories. At [facility] testing was requested and results need to be followed for molecular biology of the tumor to include MSI status or other clinically relevant molecular markers.    The patient will return to this clinic for follow-up, most probable with Dr. Lurena Joiner, in 2 weeks after her first round of chemotherapy to assess for toxicity and possible side effects.    The patient and her sister had a long list of questions regarding diagnosis, treatment, sequencing of multi modality treatments, side

## 2017-04-09 NOTE — ED Provider Notes
Nursing note and vitals reviewed.      Differential DDx: g tube clogged vs cellulitis vs abscess vs adenocarcinoma vs other    Is this an Emergent Medical Condition? Yes - Severe Pain/Acute Onset of Symptons  409.901 FS  641.19 FS  627.732 (16) FS    ED Workup   Procedures    Labs:  - - No data to display      Imaging (Read by ED Provider):  not applicable      EKG (Read by ED Provider):  not applicable        ED Course & Re-Evaluation     ED Course    A&Ox3.  Neuro intact.  LLQ: J tube with no erythema, edema, or drainage.     Consulted general surgery, Dr. Helane Rima, states that he will contact Dr. Freddrick March to come to the ED.    ARNP came down and replaced J tube.    Discussed results with patient.  F/U with General surgery for further eval.  If symptoms worsen F/U at ED.    MDM   Decide to obtain history from someone other than the patient: No    Decide to obtain previous medical records: Yes, clinic    Clinical Lab Test(s): N/A    Diagnostic Tests (Radiology, EKG): N/A    Independent Visualization (ED [place], Wet Prep, Other): No    Discussed patient with NON-ED Provider: None      ED Disposition   ED Disposition: Discharge      ED Clinical Impression   ED Clinical Impression:   Malfunction of jejunostomy tube  Jejunostomy tube present      ED Patient Status   Patient Status:   Good        ED Medical Evaluation Initiated   Medical Evaluation Initiated:  Yes, filed at 04/09/17 1559  by Park Meo, PA-C

## 2017-04-09 NOTE — Progress Notes
EUS, HER-2/neu negative patient also had a J-tube placed for feeding purposes due to persistent nausea and vomiting,  Patient is here for follow-up, she has completed 2 courses of chemotherapy and tolerated it reasonably well although continued to have nausea. She has been using jejunostomy tube and tolerating with some eval. Although she is having some dehydration and Gave extra fluid last week. Denies any high fever chills cough or chest pain, feeling very tired fatigue, no bleeding problem  Interim medical history, surgical history, family history social history were reviewed no change from last visit June 2018      Past Surgical History:   Past Surgical History:   Procedure Laterality Date   ? BREAST RECONSTRUCTION  2016   ? CATARACT EXTRACTION W/ INTRAOCULAR LENS IMPLANT     ? COLONOSCOPY W/ BIOPSY  01/28/2017   ? EGD W/ BIOPSY  01/28/2017   ? EUS UPPER W/ FNA  02/08/2017   ? HYSTERECTOMY  2003    uterine fibroids   ? LASIK     ? MASTECTOMY  2011   Diagnostic laparoscopy and laparoscopic feeding jejunostomy tube placement 03/13/2017    Social History:    Social History     Social History   ? Marital status: Single     Spouse name: N/A   ? Number of children: 0   ? Years of education: N/A     Occupational History   ? Not on file.     Social History Main Topics   ? Smoking status: Former Smoker     Packs/day: 1.00     Years: 15.00     Types: Cigarettes     Start date: 06/24/1974     Quit date: 11/29/1988   ? Smokeless tobacco: Never Used   ? Alcohol use Yes   ? Drug use: Yes     Frequency: 7.0 times per week     Types: Marijuana   ? Sexual activity: Not on file     Other Topics Concern   ? Not on file     Social History Narrative   ? No narrative on file       Family History:    Family History   Problem Relation Age of Onset   ? Breast Cancer Mother    ? Prostate Cancer Father    ? Colon Cancer Father        Vital Signs:   Vitals:    04/08/17 1451   BP: 99/63   Pulse: 75   Resp: 17   Temp: 36.6 ?C (97.9 ?F)

## 2017-04-09 NOTE — Progress Notes
?   ALBUMIN/GLOBULIN RATIO 04/02/2017 2.2    ? Total Bilirubin 04/02/2017 0.4    ? Alkaline Phosphatase 04/02/2017 82    ? AST 04/02/2017 18    ? ALT 04/02/2017 24    Admission on 03/13/2017, Discharged on 03/13/2017   Component Date Value   ? Sodium 03/13/2017 143    ? Potassium 03/13/2017 3.5    ? Chloride 03/13/2017 102    ? CO2 03/13/2017 25    ? Urea Nitrogen 03/13/2017 9    ? Creatinine 03/13/2017 0.69    ? BUN/Creatinine Ratio 03/13/2017 13.0    ? Glucose 03/13/2017 85    ? Calcium 03/13/2017 9.8    ? Osmolality Calc 03/13/2017 282.9    ? Anion Gap 03/13/2017 16    ? EGFR 03/13/2017 >59    ? Protime 03/13/2017 13.2    ? INR 03/13/2017 1.0    ? WBC 03/13/2017 8.19    ? RBC 03/13/2017 3.58*   ? Hemoglobin 03/13/2017 11.4*   ? Hematocrit 03/13/2017 32.0*   ? MCV 03/13/2017 89.4    ? [facility] 03/13/2017 31.8    ? MCHC 03/13/2017 35.6    ? RDW 03/13/2017 11.9*   ? Platelet Count 03/13/2017 219    ? MPV 03/13/2017 10.9    ? nRBC % 03/13/2017 0.0    ? Absolute NRBC Count 03/13/2017 0.00    ? Neutrophils % 03/13/2017 46.8    ? Lymphocytes % 03/13/2017 40.8    ? Monocytes % 03/13/2017 9.2*   ? Eosinophils % 03/13/2017 2.3    ? Immature Granulocytes % 03/13/2017 0.5    ? Neutrophils Absolute 03/13/2017 3.84    ? Lymphocytes Absolute 03/13/2017 3.34    ? Monocytes Absolute 03/13/2017 0.75    ? Eosinophils Absolute 03/13/2017 0.19    ? Basophil Absolute 03/13/2017 0.03    ? Absolute Immature Granul* 03/13/2017 0.04*   ? Basophils % 03/13/2017 0.4    Autoreleased Orders on 03/11/2017   Component Date Value   ? Glucose 03/19/2017 85    ? Urea Nitrogen 03/19/2017 14    ? Creatinine 03/19/2017 0.58    ? EGFR 03/19/2017 102    ? Glom Filt Rate, Est Afri* 03/19/2017 118    ? BUN/Creatinine Ratio 62/26/3335 NOT APPLICABLE    ? Sodium 03/19/2017 140    ? Potassium 03/19/2017 4.6    ? Chloride 03/19/2017 104    ? CARBON DIOXIDE 03/19/2017 30    ? Calcium 03/19/2017 9.3    ? Protein, Total 03/19/2017 5.7*   ? ALBUMIN 03/19/2017 3.8

## 2017-04-09 NOTE — ED Provider Notes
Is this an Emergent Medical Condition? {SH ED EMERGENT MEDICAL CONDITION:[phone]}  409.901 FS  641.19 FS  627.732 (16) FS    ED Workup   Procedures    Labs:  - - No data to display      Imaging (Read by ED Provider):  {Imaging findings:[phone]}      EKG (Read by ED Provider):  {EKG findings:[phone]}        ED Course & Re-Evaluation     ED Course    A&Ox3.  Neuro intact.  LLQ: J tube with no erythema, edema, or drainage.     Consulted general surgery, Dr. Helane Rima, states that he will contact Dr. Freddrick March to come to the ED.    MDM   Decide to obtain history from someone other than the patient: No    Decide to obtain previous medical records: Yes, clinic    Clinical Lab Test(s): N/A    Diagnostic Tests (Radiology, EKG): N/A    Independent Visualization (ED [place], Wet Prep, Other): No    Discussed patient with NON-ED Provider: None      ED Disposition   ED Disposition: No ED Disposition Set      ED Clinical Impression   ED Clinical Impression:   No Clinical Impression Set      ED Patient Status   Patient Status:   Good        ED Medical Evaluation Initiated   Medical Evaluation Initiated:  Yes, filed at 04/09/17 1559  by Park Meo, PA-C

## 2017-04-09 NOTE — Progress Notes
Weight: 55.5 kg (122 lb 4.8 oz)   Height: 1.702 m (5\' 7" )     Vitals:    04/08/17 1451   BP: 99/63   Pulse: 75   Resp: 17   Temp: 36.6 ?C (97.9 ?F)   Weight: 55.5 kg (122 lb 4.8 oz)   Height: 1.702 m (5\' 7" )       Allergies: Patient has no known allergies.    Review of Systems:    Constitutional: Moderate weight loss, moderate fatigue, No fever  Eyes: No double vision  Ears,Nose, Mouth,Throat: No sinus trouble, No sore throat  Cardiac: No lightheadedness, No swelling in legs, No chest pain  Respiratory: No cough, No hemoptysis, No shortness of breath  Gastrointestinal:  Mild nausea, no vomiting No diarrhea, No abdominal pain. Mild dysphagia, jejunostomy tube in place  Genitourinary: No burning on urination  Musculosketal: No bone pain.   Skin: No skin rash  Neurological: No seizures, No loss of balance, No weakness of limbs  Psychiatric: Deferred  Hematologic/Lymphatic/Immunologic: No bleeding, No lumps in arm pits  April 08, 2017, 12 system review done as above    Medications:   Current Outpatient Prescriptions:   ?  ALPRAZolam (XANAX) 0.5 MG PO Tablet, TK 1 T PO QHS, Disp: , Rfl: 2  ?  amoxicillin-clavulanate (AUGMENTIN) 400-57 MG PO Tablet Chewable, Chew 1 tablet 2 times daily for 1 day., Disp: 2 tablet, Rfl: 0  ?  ARMOUR THYROID 15 MG PO Tablet, TK 3 TS PO ONCE D OES, Disp: , Rfl: 3  ?  bimatoprost (LATISSE) 0.03 % EX Solution, , Disp: , Rfl: 11  ?  buPROPion HCl (WELLBUTRIN XL) 300 MG PO Tablet Extended Release 24 Hour, TK 1 T   PO IN THE MORNING ONCE D, Disp: , Rfl: 2  ?  CARAFATE 1 GM/10ML PO Suspension, TK 10 ML PO TID OES 1 HOUR B MEALS AND HS, Disp: , Rfl: 2  ?  diclofenac (VOLTAREN GEL) 1 % TD Gel, APP AA BID UTD, Disp: , Rfl: 0  ?  Diclofenac Sodium CR 100 MG PO Tablet Extended Release 24 Hour, TK 1 T PO ONCE A DAY, Disp: , Rfl: 2  ?  DOK 100 MG PO Capsule, TK ONE C PO BID WITH PLENTY OF WATER, Disp: , Rfl: 0  ?  eszopiclone (LUNESTA) 3 MG PO Tablet, , Disp: , Rfl: 2

## 2017-04-09 NOTE — Treatment Plan
Removed J-tube due to issues and clogging.  Replaced with 12 French foley.  Placed 3 cc of saline in the balloon.  Pt tolerated well.  Placed 4 x 4 and secured tube.  Pt due to go home.  Pt has a f/u with Dr. Freddrick March on 04/25/17 at 1115.      Gary Fleet, ARNP  04/09/2017 4:42 PM

## 2017-04-09 NOTE — Progress Notes
Value:Surgical Pathology Report                         Case: [id number]                                  Authorizing Provider:  Gracy Bruins, MD           Collected:           04/01/2017 [phone]              Pathologist:           Wilfrid Lund, MD     Received:            04/01/2017 [phone]              Specimen:    Consult                                                                                   ? Interpretation 04/01/2017                      Value:This result contains rich text formatting which cannot be displayed here.   ? Attestation Statement 04/01/2017                      Value:This result contains rich text formatting which cannot be displayed here.   Hospital Outpatient Visit on 03/19/2017   Component Date Value   ? WBC 03/19/2017 10.76    ? RBC 03/19/2017 3.60    ? Hemoglobin 03/19/2017 11.5*   ? Hematocrit 03/19/2017 34.4*   ? MCV 03/19/2017 95.6    ? [facility] 03/19/2017 31.9    ? MCHC 03/19/2017 33.4    ? RDW 03/19/2017 12.0    ? Platelet Count 03/19/2017 177    ? MPV 03/19/2017 10.8    ? Neutrophils Absolute 03/19/2017 7.87    ? Neutrophils % 03/19/2017 73.2    ? Lymphocytes Absolute 03/19/2017 1.50    ? Lymphocytes % 03/19/2017 13.9*   ? Monocytes Absolute 03/19/2017 1.12    ? Monocytes % 03/19/2017 10.4    ? Eosinophils Absolute 03/19/2017 0.24    ? Eosinophils % 03/19/2017 2.2    ? Basophil Absolute 03/19/2017 0.03    ? BASOPHILS 03/19/2017 0.3    Office Visit on 03/19/2017   Component Date Value   ? Glucose 04/02/2017 88    ? Urea Nitrogen 04/02/2017 17    ? Creatinine 04/02/2017 0.68    ? EGFR 04/02/2017 96    ? Glom Filt Rate, Est Afri* 04/02/2017 112    ? BUN/Creatinine Ratio 59/06/3111 NOT APPLICABLE    ? Sodium 04/02/2017 137    ? Potassium 04/02/2017 4.4    ? Chloride 04/02/2017 102    ? CARBON DIOXIDE 04/02/2017 28    ? Calcium 04/02/2017 9.0    ? Protein, Total 04/02/2017 5.5*   ? ALBUMIN 04/02/2017 3.8    ? Globulin 04/02/2017 1.7*

## 2017-04-09 NOTE — ED Provider Notes
History   No chief complaint on file.      61 year old female with complaint of a J tube clogged.  She has a history of a gastricadenocarcinoma.  She called general surgery, Dr. Freddrick March, advised going to ED for a non-emergent visit for a replacement of her Jtube (12 fr).  She reports no fever, drainage, or erythema at the site.  She had the J tube placed 1 month ago.      The history is provided by the patient. No language interpreter was used.       No Known Allergies    Patient's Medications   New Prescriptions    No medications on file   Previous Medications    ALPRAZOLAM (XANAX) 0.5 MG PO TABLET    TK 1 T PO QHS    AMOXICILLIN-CLAVULANATE (AUGMENTIN) 400-57 MG PO TABLET CHEWABLE    Chew 1 tablet 2 times daily for 1 day.    ARMOUR THYROID 15 MG PO TABLET    TK 3 TS PO ONCE D OES    BIMATOPROST (LATISSE) 0.03 % EX SOLUTION        BUPROPION HCL (WELLBUTRIN XL) 300 MG PO TABLET EXTENDED RELEASE 24 HOUR    TK 1 T   PO IN THE MORNING ONCE D    CARAFATE 1 GM/10ML PO SUSPENSION    TK 10 ML PO TID OES 1 HOUR B MEALS AND HS    DICLOFENAC (VOLTAREN GEL) 1 % TD GEL    APP AA BID UTD    DICLOFENAC SODIUM CR 100 MG PO TABLET EXTENDED RELEASE 24 HOUR    TK 1 T PO ONCE A DAY    DOK 100 MG PO CAPSULE    TK ONE C PO BID WITH PLENTY OF WATER    ESZOPICLONE (LUNESTA) 3 MG PO TABLET        HYDROCODONE-ACETAMINOPHEN (NORCO) 10-325 MG PO TABLET    TK 1 T PO Q 6 H PRN    HYDROCODONE-ACETAMINOPHEN (NORCO) 5-325 MG PO TABLET    TK 1-2 TS PO Q 6 H    IBUPROFEN (ADVIL,MOTRIN) 600 MG PO TABLET    Take 1 tablet by mouth 3 times daily for 5 days.    LIDOCAINE-PRILOCAINE (EMLA) 2.5-2.5 % EX CREAM    Apply 30 minutes prior to treatment    OMEPRAZOLE (PRILOSEC) 40 MG PO CAPSULE DELAYED RELEASE    TK 1 C PO QD AC    ONDANSETRON (ZOFRAN-ODT) 8 MG PO TABLET DISINTEGRATING    Dissolve 1 tablet in mouth every 8 hours as needed for nausea.    PROCHLORPERAZINE (COMPAZINE) 10 MG PO TABLET    Take 1 tablet by mouth every 6 hours as needed.

## 2017-04-09 NOTE — Progress Notes
effects of chemotherapy and this questions were answered to the best of my knowledge during this initial evaluation.    It is of note is that the knee CT scan of the chest abdomen and pelvis from Feb 05, 2017 to indeterminate subcentimeter low-density lesions were noted within the lateral segment of the left hepatic lobe. These were biopsied by fine-needle aspiration at [facility] and were negative for malignancy. Most recent PET CT scan at Dr. Dario [facility] facility did not confirm liver metastasis.    With today's evaluation also I have requested baseline laboratory testing to include a CBC, CMP, LDH, CEA.  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 If she does have a good response she'll be a candidate for curative surgery. Also extensively discussed with him about the toxicities of chemo therapy which may include mild nausea vomiting alopecia myelosuppression, oxaliplatin-induced neuropathy, excessive sensitivity to cold temperature as well as occasional mucositis from 5-fluorouracil. Patient and her sister voiced her good understanding and are anxious to proceed with chemo therapy. I plan to see her back again in 10 days CBC CMP for follow-up  I also left the message with Dr. Robbie Lis to see whether he has sent specimen for Foundation molecular analysis if not I will send to [facility] for the same analysis  I spent more than 40 minutes including 30 minutes counseling time and answered all her questions to their satisfaction  March 07, 2017  This patient has persistent nausea vomiting and also has some obstructive symptoms from GE junction tumor, I have discussed the case with Dr. Freddrick March was suggested to have a jejunostomy tube placed for nutritional purposes.

## 2017-04-09 NOTE — ED Provider Notes
Review of Systems   Constitutional: Negative for fever and fatigue.   Eyes: Negative for pain and discharge.   Respiratory: Negative for chest tightness and shortness of breath.    Cardiovascular: Negative for chest pain and palpitations.   Gastrointestinal: Negative for nausea, vomiting and diarrhea.   Genitourinary: Negative for dysuria and flank pain.   Musculoskeletal: Negative for back pain, joint swelling and gait problem.   Skin: Negative for color change and wound.   Neurological: Negative for dizziness and headaches.   Psychiatric/Behavioral: Negative for agitation and anxiety.   All other systems reviewed and are negative.      Physical Exam     ED Triage Vitals [04/09/17 1606]   BP 136/51   Pulse 72   Resp 16   Temp 36.7 ?C (98 ?F)   Temp src Oral   Height 1.702 m   Weight 55.3 kg   SpO2 98 %   BMI (Calculated) 19.15             Physical Exam   Constitutional: She is oriented to person, place, and time. She appears well-developed and well-nourished. No distress.   HENT:   Head: Normocephalic and atraumatic.   Nose: Nose normal.   Mouth/Throat: Oropharynx is clear and moist.   Eyes: Conjunctivae and EOM are normal. Pupils are equal, round, and reactive to light.   Neck: Normal range of motion. Neck supple. No tracheal deviation present.   Cardiovascular: Normal rate and regular rhythm.    Pulmonary/Chest: Effort normal and breath sounds normal. No stridor. No respiratory distress.   Abdominal: Soft. There is no tenderness.       Musculoskeletal: She exhibits no edema.   Neurological: She is alert and oriented to person, place, and time. She has normal strength. She displays no atrophy. No cranial nerve deficit or sensory deficit. She exhibits normal muscle tone. She displays a negative Romberg sign. Coordination and gait normal.   Skin: Skin is warm and dry. No rash noted. She is not diaphoretic.   Psychiatric: She has a normal mood and affect. Her behavior is normal. Judgment and thought content normal.

## 2017-04-10 NOTE — ED Attestation Note
Attestation   The patient was seen exclusively by the PA/ARNP, and was not seen by me. I was immediately available in the Emergency Department for consultation as needed.             Renita Papa, MD  04/10/17 [phone]

## 2017-04-11 ENCOUNTER — Inpatient Hospital Stay: Admit: 2017-04-11 | Discharge: 2017-04-12 | Primary: Internal Medicine

## 2017-04-11 DIAGNOSIS — C169 Malignant neoplasm of stomach, unspecified: Principal | ICD-10-CM

## 2017-04-15 DIAGNOSIS — C169 Malignant neoplasm of stomach, unspecified: Principal | ICD-10-CM

## 2017-04-16 ENCOUNTER — Inpatient Hospital Stay: Attending: Hematology & Oncology | Primary: Internal Medicine

## 2017-04-16 ENCOUNTER — Inpatient Hospital Stay: Primary: Internal Medicine

## 2017-04-16 ENCOUNTER — Inpatient Hospital Stay: Admit: 2017-04-16 | Discharge: 2017-04-17 | Primary: Internal Medicine

## 2017-04-16 DIAGNOSIS — C169 Malignant neoplasm of stomach, unspecified: Secondary | ICD-10-CM

## 2017-04-16 DIAGNOSIS — Z5111 Encounter for antineoplastic chemotherapy: Principal | ICD-10-CM

## 2017-04-16 MED ORDER — ONDANSETRON WITH DEXAMETHASONE IVPB (NON-SPECIFIED)
Freq: Once | INTRAVENOUS | Status: CP
Start: 2017-04-16 — End: ?

## 2017-04-16 MED ORDER — FLUOROURACIL IV SYRINGE BCN JX
400 mg/m2 | Freq: Once | INTRAVENOUS | Status: CP
Start: 2017-04-16 — End: ?

## 2017-04-16 MED ORDER — DEXTROSE 5 % IV SOLN
Freq: Once | INTRAVENOUS | Status: CP
Start: 2017-04-16 — End: ?

## 2017-04-16 MED ORDER — LEUCOVORIN IVPB JX
400 mg/m2 | Freq: Once | INTRAVENOUS | Status: CP
Start: 2017-04-16 — End: ?

## 2017-04-16 MED ORDER — OXALIPLATIN INFUSION BCN JX
85 mg/m2 | Freq: Once | INTRAVENOUS | Status: DC
Start: 2017-04-16 — End: 2017-04-17

## 2017-04-16 MED ORDER — FLUOROURACIL INFUSION AMB PUMP BCN JX
2400 mg/m2 | Freq: Once | INTRAVENOUS | Status: DC
Start: 2017-04-16 — End: 2017-04-17

## 2017-04-16 MED ORDER — FOSAPREPITANT IVPB JX
150 mg | Freq: Once | INTRAVENOUS | Status: CP
Start: 2017-04-16 — End: ?

## 2017-04-18 ENCOUNTER — Inpatient Hospital Stay: Admit: 2017-04-18 | Discharge: 2017-04-19 | Primary: Internal Medicine

## 2017-04-18 DIAGNOSIS — R634 Abnormal weight loss: Secondary | ICD-10-CM

## 2017-04-18 DIAGNOSIS — C169 Malignant neoplasm of stomach, unspecified: Principal | ICD-10-CM

## 2017-04-18 DIAGNOSIS — K219 Gastro-esophageal reflux disease without esophagitis: Secondary | ICD-10-CM

## 2017-04-18 MED ORDER — SODIUM CHLORIDE 0.9 % IV SOLN
Freq: Once | INTRAVENOUS | Status: CN
Start: 2017-04-18 — End: ?

## 2017-04-18 MED ORDER — PROMETHAZINE IN NS IVPB JX
12.5 mg | Freq: Once | INTRAVENOUS | Status: CP
Start: 2017-04-18 — End: ?

## 2017-04-18 MED ORDER — LORAZEPAM 2 MG/ML IJ SOLN
.5 mg | Freq: Once | INTRAVENOUS | Status: CP
Start: 2017-04-18 — End: ?

## 2017-04-18 MED ORDER — SODIUM CHLORIDE 0.9 % IV SOLN
Freq: Once | INTRAVENOUS | Status: CP
Start: 2017-04-18 — End: ?

## 2017-04-18 MED ORDER — FAMOTIDINE IVPB JX
20 mg | Freq: Once | INTRAVENOUS | Status: CN
Start: 2017-04-18 — End: ?

## 2017-04-18 MED ORDER — HEPARIN SODIUM LOCK FLUSH 100 UNIT/ML IV SOLN
500 [IU] | Status: DC | PRN
Start: 2017-04-18 — End: 2017-04-19

## 2017-04-18 NOTE — Progress Notes
Patient arrived to clinic for CADD d/c and port flush and began complaining of weakness, fatigue, and nausea. Patient requested hydration- 1044mL NS administered over 2 hours. Patient received Ativan and Phenergan to relieve nausea, upon completion of treatment patient stated she felt much better and planned to return home to rest.

## 2017-04-25 ENCOUNTER — Encounter: Attending: Surgery | Primary: Internal Medicine

## 2017-04-30 ENCOUNTER — Inpatient Hospital Stay: Primary: Internal Medicine

## 2017-04-30 MED ORDER — DEXAMETHASONE IVPB BCN JX
10 mg | Freq: Once | INTRAVENOUS | Status: CN
Start: 2017-04-30 — End: ?

## 2017-04-30 MED ORDER — HYDROCORTISONE NA SUCCINATE PF 100 MG IJ SOLR
100 mg | INTRAVENOUS | Status: CN | PRN
Start: 2017-04-30 — End: ?

## 2017-04-30 MED ORDER — FLUOROURACIL INFUSION AMB PUMP BCN JX
2400 mg/m2 | Freq: Once | INTRAVENOUS | Status: CN
Start: 2017-04-30 — End: ?

## 2017-04-30 MED ORDER — MEPERIDINE HCL 25 MG/ML IJ SOLN
25 mg | INTRAVENOUS | Status: CN | PRN
Start: 2017-04-30 — End: ?

## 2017-04-30 MED ORDER — DISCONNECT MEDICATION INFUSION JX
1 | Freq: Once | Status: CN
Start: 2017-04-30 — End: ?

## 2017-04-30 MED ORDER — DEXAMETHASONE SODIUM PHOSPHATE 20 MG/5ML IJ SOLN
20 mg | INTRAVENOUS | Status: CN | PRN
Start: 2017-04-30 — End: ?

## 2017-04-30 MED ORDER — OXALIPLATIN INFUSION BCN JX
85 mg/m2 | Freq: Once | INTRAVENOUS | Status: CN
Start: 2017-04-30 — End: ?

## 2017-04-30 MED ORDER — HEPARIN SODIUM LOCK FLUSH 100 UNIT/ML IV SOLN
500 [IU] | Status: CN | PRN
Start: 2017-04-30 — End: ?

## 2017-04-30 MED ORDER — CONNECT MEDICATION INFUSION JX
1 | Freq: Once | Status: CN
Start: 2017-04-30 — End: ?

## 2017-04-30 MED ORDER — DIPHENHYDRAMINE HCL 50 MG/ML IJ SOLN
25 mg | INTRAVENOUS | Status: CN | PRN
Start: 2017-04-30 — End: ?

## 2017-04-30 MED ORDER — LEUCOVORIN IVPB JX
400 mg/m2 | Freq: Once | INTRAVENOUS | Status: CN
Start: 2017-04-30 — End: ?

## 2017-04-30 MED ORDER — SODIUM CHLORIDE FLUSH 0.9 % IV SOLN
10 mL | Status: CN | PRN
Start: 2017-04-30 — End: ?

## 2017-04-30 MED ORDER — FOSAPREPITANT IVPB JX
150 mg | Freq: Once | INTRAVENOUS | Status: CN
Start: 2017-04-30 — End: ?

## 2017-04-30 MED ORDER — FLUOROURACIL IV SYRINGE BCN JX
400 mg/m2 | Freq: Once | INTRAVENOUS | Status: CN
Start: 2017-04-30 — End: ?

## 2017-04-30 MED ORDER — DEXTROSE 5 % IV SOLN
Freq: Once | INTRAVENOUS | Status: CN
Start: 2017-04-30 — End: ?

## 2017-04-30 MED ORDER — ONDANSETRON IVPB BCN JX
4 mg | Freq: Once | INTRAVENOUS | Status: CN
Start: 2017-04-30 — End: ?

## 2017-05-01 ENCOUNTER — Inpatient Hospital Stay: Admit: 2017-05-01 | Discharge: 2017-05-02 | Primary: Internal Medicine

## 2017-05-01 DIAGNOSIS — C169 Malignant neoplasm of stomach, unspecified: Principal | ICD-10-CM

## 2017-05-01 DIAGNOSIS — Z5111 Encounter for antineoplastic chemotherapy: Secondary | ICD-10-CM

## 2017-05-01 DIAGNOSIS — C160 Malignant neoplasm of cardia: Secondary | ICD-10-CM

## 2017-05-01 MED ORDER — FLUOROURACIL IV SYRINGE BCN JX
400 mg/m2 | Freq: Once | INTRAVENOUS | Status: CP
Start: 2017-05-01 — End: ?

## 2017-05-01 MED ORDER — LEUCOVORIN IVPB JX
400 mg/m2 | Freq: Once | INTRAVENOUS | Status: CP
Start: 2017-05-01 — End: ?

## 2017-05-01 MED ORDER — ONDANSETRON WITH DEXAMETHASONE IVPB (NON-SPECIFIED)
Freq: Once | INTRAVENOUS | Status: CP
Start: 2017-05-01 — End: ?

## 2017-05-01 MED ORDER — FOSAPREPITANT IVPB JX
150 mg | Freq: Once | INTRAVENOUS | Status: CP
Start: 2017-05-01 — End: ?

## 2017-05-01 MED ORDER — OXALIPLATIN INFUSION BCN JX
85 mg/m2 | Freq: Once | INTRAVENOUS | Status: DC
Start: 2017-05-01 — End: 2017-05-02

## 2017-05-01 MED ORDER — FLUOROURACIL INFUSION AMB PUMP BCN JX
2400 mg/m2 | Freq: Once | INTRAVENOUS | Status: DC
Start: 2017-05-01 — End: 2017-05-02

## 2017-05-01 MED ORDER — DEXTROSE 5 % IV SOLN
Freq: Once | INTRAVENOUS | Status: CP
Start: 2017-05-01 — End: ?

## 2017-05-02 ENCOUNTER — Ambulatory Visit: Primary: Internal Medicine

## 2017-05-03 ENCOUNTER — Inpatient Hospital Stay: Admit: 2017-05-03 | Discharge: 2017-05-04 | Primary: Internal Medicine

## 2017-05-03 DIAGNOSIS — C169 Malignant neoplasm of stomach, unspecified: Principal | ICD-10-CM

## 2017-05-03 DIAGNOSIS — C160 Malignant neoplasm of cardia: Secondary | ICD-10-CM

## 2017-05-03 DIAGNOSIS — K219 Gastro-esophageal reflux disease without esophagitis: Secondary | ICD-10-CM

## 2017-05-03 DIAGNOSIS — F064 Anxiety disorder due to known physiological condition: Secondary | ICD-10-CM

## 2017-05-03 DIAGNOSIS — R634 Abnormal weight loss: Secondary | ICD-10-CM

## 2017-05-03 MED ORDER — PALONOSETRON HCL 0.25 MG/5ML IV SOLN
.25 mg | Freq: Once | INTRAVENOUS | Status: CP
Start: 2017-05-03 — End: ?

## 2017-05-03 MED ORDER — HEPARIN SODIUM LOCK FLUSH 100 UNIT/ML IV SOLN
300-500 [IU] | Status: DC | PRN
Start: 2017-05-03 — End: 2017-05-04

## 2017-05-03 MED ORDER — LORAZEPAM 2 MG/ML IJ SOLN
.5 mg | Freq: Once | INTRAVENOUS | Status: CP
Start: 2017-05-03 — End: ?

## 2017-05-03 MED ORDER — FAMOTIDINE IVPB JX
20 mg | Freq: Once | INTRAVENOUS | Status: CN
Start: 2017-05-03 — End: ?

## 2017-05-03 MED ORDER — HEPARIN SODIUM LOCK FLUSH 100 UNIT/ML IV SOLN
500 [IU] | Status: DC | PRN
Start: 2017-05-03 — End: 2017-05-03

## 2017-05-03 MED ORDER — SODIUM CHLORIDE 0.9 % IV SOLN
Freq: Once | INTRAVENOUS | Status: CP
Start: 2017-05-03 — End: ?

## 2017-05-03 MED ORDER — SODIUM CHLORIDE 0.9 % IV SOLN
Freq: Once | INTRAVENOUS | Status: CN
Start: 2017-05-03 — End: ?

## 2017-05-06 ENCOUNTER — Ambulatory Visit: Admit: 2017-05-06 | Discharge: 2017-05-07 | Attending: Hematology & Oncology | Primary: Internal Medicine

## 2017-05-06 DIAGNOSIS — C169 Malignant neoplasm of stomach, unspecified: Secondary | ICD-10-CM

## 2017-05-06 DIAGNOSIS — K769 Liver disease, unspecified: Secondary | ICD-10-CM

## 2017-05-06 DIAGNOSIS — C50911 Malignant neoplasm of unspecified site of right female breast: Secondary | ICD-10-CM

## 2017-05-06 DIAGNOSIS — E039 Hypothyroidism, unspecified: Secondary | ICD-10-CM

## 2017-05-06 DIAGNOSIS — F419 Anxiety disorder, unspecified: Secondary | ICD-10-CM

## 2017-05-06 DIAGNOSIS — D219 Benign neoplasm of connective and other soft tissue, unspecified: Secondary | ICD-10-CM

## 2017-05-06 DIAGNOSIS — R1013 Epigastric pain: Secondary | ICD-10-CM

## 2017-05-06 DIAGNOSIS — Z8042 Family history of malignant neoplasm of prostate: Secondary | ICD-10-CM

## 2017-05-06 DIAGNOSIS — Z8509 Personal history of malignant neoplasm of other digestive organs: Secondary | ICD-10-CM

## 2017-05-06 DIAGNOSIS — R634 Abnormal weight loss: Secondary | ICD-10-CM

## 2017-05-06 DIAGNOSIS — R1310 Dysphagia, unspecified: Secondary | ICD-10-CM

## 2017-05-06 DIAGNOSIS — E43 Unspecified severe protein-calorie malnutrition: Secondary | ICD-10-CM

## 2017-05-06 DIAGNOSIS — C160 Malignant neoplasm of cardia: Secondary | ICD-10-CM

## 2017-05-06 DIAGNOSIS — Z09 Encounter for follow-up examination after completed treatment for conditions other than malignant neoplasm: Secondary | ICD-10-CM

## 2017-05-06 DIAGNOSIS — Z87891 Personal history of nicotine dependence: Secondary | ICD-10-CM

## 2017-05-06 DIAGNOSIS — K219 Gastro-esophageal reflux disease without esophagitis: Principal | ICD-10-CM

## 2017-05-06 DIAGNOSIS — Z800 Family history of malignant neoplasm of digestive organs: Secondary | ICD-10-CM

## 2017-05-06 DIAGNOSIS — R9389 Abnormal findings on diagnostic imaging of other specified body structures: Secondary | ICD-10-CM

## 2017-05-06 DIAGNOSIS — Z803 Family history of malignant neoplasm of breast: Secondary | ICD-10-CM

## 2017-05-06 DIAGNOSIS — M1990 Unspecified osteoarthritis, unspecified site: Secondary | ICD-10-CM

## 2017-05-06 DIAGNOSIS — Z90710 Acquired absence of both cervix and uterus: Secondary | ICD-10-CM

## 2017-05-06 DIAGNOSIS — Z08 Encounter for follow-up examination after completed treatment for malignant neoplasm: Principal | ICD-10-CM

## 2017-05-06 DIAGNOSIS — Z931 Gastrostomy status: Secondary | ICD-10-CM

## 2017-05-06 DIAGNOSIS — Z9221 Personal history of antineoplastic chemotherapy: Secondary | ICD-10-CM

## 2017-05-06 NOTE — Progress Notes
chemotherapy associated nausea and vomiting, to her local pharmacy. We did add toward with the patient and the sister of our chemotherapy unit and introduce her to our chemotherapy nurses. I have prescribed chemotherapy to start tentatively on Wednesday, Feb 27, 2017., Achieved complete remission by PET CT scan 04/25/2017    Given her heavy family history of breast cancer, colon cancer, prostate cancer, I have requested a genetic evaluation testing by myriad laboratories. At [facility] [place] Foundation testing was requested and results need to be followed for molecular biology of the tumor to include MSI status or other clinically relevant molecular markers.    The patient will return to this clinic for follow-up, most probable with Dr. Lurena Joiner, in 2 weeks after her first round of chemotherapy to assess for toxicity and possible side effects.    The patient and her sister had a long list of questions regarding diagnosis, treatment, sequencing of multi modality treatments, side effects of chemotherapy and this questions were answered to the best of my knowledge during this initial evaluation.    It is of note is that the knee CT scan of the chest abdomen and pelvis from Feb 05, 2017 to indeterminate subcentimeter low-density lesions were noted within the lateral segment of the left hepatic lobe. These were biopsied by fine-needle aspiration at [facility] and were negative for malignancy. Most recent PET CT scan at Dr. D[facility] facility did not confirm liver metastasis.    With today's evaluation also I have requested baseline laboratory testing to include a CBC, CMP, LDH, CEA.  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 If she does have a good response she'll be a candidate for

## 2017-05-06 NOTE — Progress Notes
tablet in mouth every 8 hours as needed for nausea., Disp: 30 tablet, Rfl: 11  ?  prochlorperazine (COMPAZINE) 10 MG PO Tablet, Take 1 tablet by mouth every 6 hours as needed., Disp: 60 tablet, Rfl: 11  ?  QUEtiapine (SEROquel) 50 MG PO Tablet, , Disp: , Rfl: 0      Labs:   Hospital Outpatient Visit on 05/01/2017   Component Date Value   ? WBC 05/01/2017 5.79    ? RBC 05/01/2017 3.42    ? Hemoglobin 05/01/2017 10.7*   ? Hematocrit 05/01/2017 32.0*   ? MCV 05/01/2017 93.6    ? [place] 05/01/2017 31.3    ? MCHC 05/01/2017 33.4    ? RDW 05/01/2017 13.7    ? Platelet Count 05/01/2017 132*   ? MPV 05/01/2017 10.8    ? Neutrophils Absolute 05/01/2017 2.60    ? Neutrophils % 05/01/2017 44.9    ? Lymphocytes Absolute 05/01/2017 2.14    ? Lymphocytes % 05/01/2017 37.0    ? Monocytes Absolute 05/01/2017 0.84    ? Monocytes % 05/01/2017 14.5    ? Eosinophils Absolute 05/01/2017 0.17    ? Eosinophils % 05/01/2017 2.9    ? Basophil Absolute 05/01/2017 0.04    ? BASOPHILS 05/01/2017 0.7    Autoreleased Orders on 04/15/2017   Component Date Value   ? Glucose 05/01/2017 97    ? Urea Nitrogen 05/01/2017 15    ? Creatinine 05/01/2017 0.58    ? EGFR 05/01/2017 102    ? Glom Filt Rate, Est Afri* 05/01/2017 118    ? BUN/Creatinine Ratio 21/30/8657 NOT APPLICABLE    ? Sodium 05/01/2017 140    ? Potassium 05/01/2017 4.3    ? Chloride 05/01/2017 106    ? CARBON DIOXIDE 05/01/2017 29    ? Calcium 05/01/2017 8.9    ? Protein, Total 05/01/2017 5.2*   ? ALBUMIN 05/01/2017 3.3*   ? Globulin 05/01/2017 1.9    ? ALBUMIN/GLOBULIN RATIO 05/01/2017 1.7    ? Total Bilirubin 05/01/2017 0.4    ? Alkaline Phosphatase 05/01/2017 97    ? AST 05/01/2017 61*   ? ALT 05/01/2017 109*   Office Visit on 04/08/2017   Component Date Value   ? WHITE BLOOD CELL COUNT 04/11/2017 5.9    ? RBC 04/11/2017 3.64*   ? Hemoglobin 04/11/2017 11.3*   ? Hematocrit 04/11/2017 34.0*   ? MCV 04/11/2017 93.4    ? [facility] 04/11/2017 31.0    ? MCHC 04/11/2017 33.2

## 2017-05-06 NOTE — Progress Notes
Value:This result contains rich text formatting which cannot be displayed here.   ? Case Report 04/01/2017                      Value:Surgical Pathology Report                         Case: [id number]                                  Authorizing Provider:  Gracy Bruins, MD           Collected:           04/01/2017 [phone]              Pathologist:           Wilfrid Lund, MD     Received:            04/01/2017 [phone]              Specimen:    Consult                                                                                   ? Interpretation 04/01/2017                      Value:This result contains rich text formatting which cannot be displayed here.   ? Attestation Statement 04/01/2017                      Value:This result contains rich text formatting which cannot be displayed here.   Hospital Outpatient Visit on 03/19/2017   Component Date Value   ? WBC 03/19/2017 10.76    ? RBC 03/19/2017 3.60    ? Hemoglobin 03/19/2017 11.5*   ? Hematocrit 03/19/2017 34.4*   ? MCV 03/19/2017 95.6    ? [facility] 03/19/2017 31.9    ? MCHC 03/19/2017 33.4    ? RDW 03/19/2017 12.0    ? Platelet Count 03/19/2017 177    ? MPV 03/19/2017 10.8    ? Neutrophils Absolute 03/19/2017 7.87    ? Neutrophils % 03/19/2017 73.2    ? Lymphocytes Absolute 03/19/2017 1.50    ? Lymphocytes % 03/19/2017 13.9*   ? Monocytes Absolute 03/19/2017 1.12    ? Monocytes % 03/19/2017 10.4    ? Eosinophils Absolute 03/19/2017 0.24    ? Eosinophils % 03/19/2017 2.2    ? Basophil Absolute 03/19/2017 0.03    ? BASOPHILS 03/19/2017 0.3    Office Visit on 03/19/2017   Component Date Value   ? Glucose 04/02/2017 88    ? Urea Nitrogen 04/02/2017 17    ? Creatinine 04/02/2017 0.68    ? EGFR 04/02/2017 96    ? Glom Filt Rate, Est Afri* 04/02/2017 112    ? BUN/Creatinine Ratio 00/34/9179 NOT APPLICABLE    ? Sodium 04/02/2017 137    ? Potassium 04/02/2017 4.4    ? Chloride 04/02/2017 102    ? CARBON DIOXIDE 04/02/2017 28    ? Calcium  04/02/2017 9.0

## 2017-05-06 NOTE — Progress Notes
?   Protein, Total 04/02/2017 5.5*   ? ALBUMIN 04/02/2017 3.8    ? Globulin 04/02/2017 1.7*   ? ALBUMIN/GLOBULIN RATIO 04/02/2017 2.2    ? Total Bilirubin 04/02/2017 0.4    ? Alkaline Phosphatase 04/02/2017 82    ? AST 04/02/2017 18    ? ALT 04/02/2017 24    Admission on 03/13/2017, Discharged on 03/13/2017   Component Date Value   ? Sodium 03/13/2017 143    ? Potassium 03/13/2017 3.5    ? Chloride 03/13/2017 102    ? CO2 03/13/2017 25    ? Urea Nitrogen 03/13/2017 9    ? Creatinine 03/13/2017 0.69    ? BUN/Creatinine Ratio 03/13/2017 13.0    ? Glucose 03/13/2017 85    ? Calcium 03/13/2017 9.8    ? Osmolality Calc 03/13/2017 282.9    ? Anion Gap 03/13/2017 16    ? EGFR 03/13/2017 >59    ? Protime 03/13/2017 13.2    ? INR 03/13/2017 1.0    ? WBC 03/13/2017 8.19    ? RBC 03/13/2017 3.58*   ? Hemoglobin 03/13/2017 11.4*   ? Hematocrit 03/13/2017 32.0*   ? MCV 03/13/2017 89.4    ? [facility] 03/13/2017 31.8    ? MCHC 03/13/2017 35.6    ? RDW 03/13/2017 11.9*   ? Platelet Count 03/13/2017 219    ? MPV 03/13/2017 10.9    ? nRBC % 03/13/2017 0.0    ? Absolute NRBC Count 03/13/2017 0.00    ? Neutrophils % 03/13/2017 46.8    ? Lymphocytes % 03/13/2017 40.8    ? Monocytes % 03/13/2017 9.2*   ? Eosinophils % 03/13/2017 2.3    ? Immature Granulocytes % 03/13/2017 0.5    ? Neutrophils Absolute 03/13/2017 3.84    ? Lymphocytes Absolute 03/13/2017 3.34    ? Monocytes Absolute 03/13/2017 0.75    ? Eosinophils Absolute 03/13/2017 0.19    ? Basophil Absolute 03/13/2017 0.03    ? Absolute Immature Granul* 03/13/2017 0.04*   ? Basophils % 03/13/2017 0.4    Autoreleased Orders on 03/11/2017   Component Date Value   ? Glucose 03/19/2017 85    ? Urea Nitrogen 03/19/2017 14    ? Creatinine 03/19/2017 0.58    ? EGFR 03/19/2017 102    ? Glom Filt Rate, Est Afri* 03/19/2017 118    ? BUN/Creatinine Ratio 17/79/3903 NOT APPLICABLE    ? Sodium 03/19/2017 140    ? Potassium 03/19/2017 4.6    ? Chloride 03/19/2017 104    ? CARBON DIOXIDE 03/19/2017 30

## 2017-05-06 NOTE — Progress Notes
Chauncey Reading, MD  [address]  [place] [address]  [facility] [place] [address]    Chief Complaint   Patient presents with   ? Follow-up     Severe protein-calorie malnutrition   The patient has been referred to this clinic for workup and evaluation and treatment of adenocarcinoma of the GE junction.    Patient Name: Mariah Cameron      Date Of Birth: 04-21-1956    History of Present Illness:    Date: 02/19/2017    Ms. 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 The patient started having severe acid reflux syndrome in November 2017 and she ended up having an endoscopy on 01/28/2017 with also endoscopic ultrasound on 02/08/2017. Results of the pathology by biopsy of a fungating mass were discussed as above. According to the patient's report the tumor was HER-2 negative. She was initially evaluated by medical oncologist at [facility] [place] but she preferred to transfer her treatment to this facility and also to the [place] and she has seen Dr. Freddrick March also as of yesterday, who recommended an MRI of the liver as well as neoadjuvant chemotherapy. I do not have results of her PET/CT scan but the patient told me verbally that this was negative for liver lesions, initially described at a recent CT scan of the upper and lower abdomen. There was no other distant metastasis but there was lymphadenopathy associated with the stomach cancer. The patient is scheduled to undergo a Mediport placement by Dr. Dara Lords as of Friday 02/22/2017. She was suggested to have FOLFOX chemotherapy as soon as possible.    Her past medical history significant for osteoporosis as well as

## 2017-05-06 NOTE — Progress Notes
?   RDW 04/11/2017 12.4    ? Platelets 04/11/2017 266    ? MPV 04/11/2017 10.7    ? Neutrophils Absolute 04/11/2017 1894    ? Lymphocytes Absolute 04/11/2017 2631    ? Monocytes Absolute 04/11/2017 1245*   ? Eosinophils Absolute 04/11/2017 89    ? Basophils Absolute 04/11/2017 41    ? Neutrophils 04/11/2017 32.1    ? LYMPHOCYTES 04/11/2017 44.6    ? MONOCYTES 04/11/2017 21.1    ? EOSINOPHILS 04/11/2017 1.5    ? BASOPHILS 04/11/2017 0.7    ? COMMENT(S) 04/11/2017     ? Glucose 04/11/2017 96    ? Urea Nitrogen 04/11/2017 18    ? Creatinine 04/11/2017 0.81    ? EGFR 04/11/2017 80    ? Glom Filt Rate, Est Afri* 04/11/2017 93    ? BUN/Creatinine Ratio 41/32/4401 NOT APPLICABLE    ? Sodium 04/11/2017 141    ? Potassium 04/11/2017 4.0    ? Chloride 04/11/2017 102    ? CARBON DIOXIDE 04/11/2017 26    ? Calcium 04/11/2017 9.4    ? Protein, Total 04/11/2017 5.8*   ? ALBUMIN 04/11/2017 3.6    ? Globulin 04/11/2017 2.2    ? ALBUMIN/GLOBULIN RATIO 04/11/2017 1.6    ? Total Bilirubin 04/11/2017 0.3    ? Alkaline Phosphatase 04/11/2017 91    ? AST 04/11/2017 24    ? ALT 04/11/2017 40*   ? CEA 04/11/2017 2.0    Hospital Outpatient Visit on 04/02/2017   Component Date Value   ? WBC 04/02/2017 8.32    ? RBC 04/02/2017 3.52    ? Hemoglobin 04/02/2017 11.0*   ? Hematocrit 04/02/2017 33.1*   ? MCV 04/02/2017 94.0    ? [place] 04/02/2017 31.3    ? MCHC 04/02/2017 33.2    ? RDW 04/02/2017 12.6    ? Platelet Count 04/02/2017 196    ? MPV 04/02/2017 10.3    ? Neutrophils Absolute 04/02/2017 5.30    ? Neutrophils % 04/02/2017 63.6    ? Lymphocytes Absolute 04/02/2017 1.67    ? Lymphocytes % 04/02/2017 20.1*   ? Monocytes Absolute 04/02/2017 1.13    ? Monocytes % 04/02/2017 13.6    ? Eosinophils Absolute 04/02/2017 0.18    ? Eosinophils % 04/02/2017 2.2    ? Basophil Absolute 04/02/2017 0.04    ? BASOPHILS 04/02/2017 0.5    Lab Requisition on 04/01/2017   Component Date Value   ? Materials Received  04/01/2017

## 2017-05-06 NOTE — Progress Notes
I spent more than 40 minutes including 30 minutes counseling time and answered all her questions to her satisfaction  For rest of her medical problem and continue with current management and follow with her primary physician. I have encouraged her to call me directly if she has any questions or if I can be of any further assistance. Results of most recent studies discussed with patient. All questions were answered to patient's satisfaction. Patient agreeable to the above plan. More than 50% of that time was spent on coordination of care, counseling, further diagnostic and therapeutic clinic and follow-up care. This document was created using voice recognition software. Inadvertent typographical errors, word omissions and/or word substitutions may exist.         Return to Clinic:  in 4 weeks.  Dr. Rosaura Carpenter,  Dr. Carmelina Paddock, Dr. Freddrick March  .

## 2017-05-06 NOTE — Progress Notes
Allergies: Patient has no known allergies.    Review of Systems:    Constitutional:No more weight loss, mild fatigue, No fever, able to swallow real food  Eyes: No double vision  Ears,Nose, Mouth,Throat: No sinus trouble, No sore throat  Cardiac: No lightheadedness, No swelling in legs, No chest pain  Respiratory: No cough, No hemoptysis, No shortness of breath  Gastrointestinal:  Mild nausea, no vomiting No diarrhea, No abdominal pain. Mild dysphagia, jejunostomy tube in place  Genitourinary: No burning on urination  Musculosketal: No bone pain.   Skin: No skin rash  Neurological: No seizures, No loss of balance, No weakness of limbs, mild numbness of both feet  Psychiatric: Deferred  Hematologic/Lymphatic/Immunologic: No bleeding, No lumps in arm pits  May 06 2017, 12 system review done as above    Medications:   Current Outpatient Prescriptions:   ?  ALPRAZolam (XANAX) 0.5 MG PO Tablet, TK 1 T PO QHS, Disp: , Rfl: 2  ?  amoxicillin-clavulanate (AUGMENTIN) 400-57 MG PO Tablet Chewable, Chew 1 tablet 2 times daily for 1 day., Disp: 2 tablet, Rfl: 0  ?  ARMOUR THYROID 15 MG PO Tablet, TK 3 TS PO ONCE D OES, Disp: , Rfl: 3  ?  CARAFATE 1 GM/10ML PO Suspension, TK 10 ML PO TID OES 1 HOUR B MEALS AND HS, Disp: , Rfl: 2  ?  diclofenac (VOLTAREN GEL) 1 % TD Gel, APP AA BID UTD, Disp: , Rfl: 0  ?  eszopiclone (LUNESTA) 3 MG PO Tablet, , Disp: , Rfl: 2  ?  HYDROcodone-acetaminophen (NORCO) 10-325 MG PO Tablet, TK 1 T PO Q 6 H PRN, Disp: , Rfl: 0  ?  HYDROcodone-acetaminophen (NORCO) 5-325 MG PO Tablet, TK 1-2 TS PO Q 6 H, Disp: , Rfl: 0  ?  ibuprofen (ADVIL,MOTRIN) 600 MG PO Tablet, Take 1 tablet by mouth 3 times daily for 5 days., Disp: 15 tablet, Rfl: 0  ?  lidocaine-prilocaine (EMLA) 2.5-2.5 % EX Cream, Apply 30 minutes prior to treatment, Disp: 60 g, Rfl: 1  ?  omeprazole (PriLOSEC) 40 MG PO Capsule Delayed Release, TK 1 C PO QD AC, Disp: , Rfl: 5  ?  ondansetron (ZOFRAN-ODT) 8 MG PO Tablet Disintegrating, Dissolve 1

## 2017-05-06 NOTE — Progress Notes
?   Calcium 03/19/2017 9.3    ? Protein, Total 03/19/2017 5.7*   ? ALBUMIN 03/19/2017 3.8    ? Globulin 03/19/2017 1.9    ? ALBUMIN/GLOBULIN RATIO 03/19/2017 2.0    ? Total Bilirubin 03/19/2017 0.3    ? Alkaline Phosphatase 03/19/2017 62    ? AST 03/19/2017 24    ? ALT 03/19/2017 30*     02/19/2017 CBC, CMP grossly normal, CEA 1.5, LDH 124  03/19/2017 CBC grossly normal except hemoglobin 11.5 CMP within normal limits, reviewed with the patient today March 19, 2017  04/02/2017 CBC grossly normal except hemoglobin 11.0 CMP grossly normal, reviewed with the patient and her sister today April 08, 2017 ,   05/01/2017 CBC grossly normal except hemoglobin 10.7, platelet 10/30/1998, CMP grossly normal except albumin 3.3, AST 103, reviewed with the patient today May 06, 2017    5/4/2018A. 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 SEE COMMENT    B. ANTRUM (ENDOSCOPIC BIOPSY):   -  MILD CHRONIC GASTRITIS WITH MINIMAL ACTIVITY.   -  NO HELICOBACTER PYLORI IDENTIFIED BY MORPHOLOGICAL EVALUATION.   -  NO INTESTINAL METAPLASIA, DYSPLASIA OR MALIGNANCY IDENTIFIED.    C. POLYP DESCENDING (ENDOSCOPIC BIOPSY):   -  TUBULAR ADENOMA   -  NEGATIVE FOR HIGH-GRADE DYSPLASIA AND MALIGNANCY  HER2 Non-Breast: NEGATIVE  Score: 1+    02/08/2017. LIVER (FINE NEEDLE ASPIRATION):   -  NO EVIDENCE OF MALIGNANCY IDENTIFIED.    -  SMALL DETACHED FRAGMENTS OF LIVER PARENCHYMA AND SUPERFICIAL GASTRIC        FOVEOLAR EPITHELIUM.    Imaging Results:     02/14/2017, PET/CT scan showed FDG uptake in the anterior wall of the antrum consistent with malignancy, 1.87 with a lymph node within the stomach and liver, no obvious liver metastasis  02/21/2017, MRI of the abdomen there is 4-5 mm nonenhancing lesion noted within the left lobe of the liver which were present and atypical cyst or small hemangioma no obvious additional lesions are seen within the liver,

## 2017-05-06 NOTE — Progress Notes
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 She had a wellness check in March 2018 and this was completely negative. The patient also has history of breast cancer in 2010 and she is status post right mastectomy with breast reconstruction. She did not receive chemotherapy but she was treated with Herceptin for 50 months by an oncologist in [place]. According to her report she could not tolerate tamoxifen. In 2016 she had an episode of vertigo and CT scan of the head showed dural thickness.    The patient has never noticed blood in the stool or in the urine, she never had the yellow jaundice, she never had hepatitis or blood transfusions. She has lost approximately 20 pounds since December 2017. She has difficulties with nutrition especially with solid food, as she has to cut the food in small pieces and chew for long time and she always gets pain postprandially.    The patient used to be a smoker for 10 years but she quit in 1989 and she smoked approximately one pack per day. She used to drink wine.    Regarding her family history her father has dementia and heart disease. Her mother had breast cancer at the age of 20 her great-grandmother also had breast cancer. There is no ovarian cancer in the family. Her father has history of prostate cancer and colon cancer. Her paternal grandmother had also colon cancer.    03/04/2017    Ms. 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 The patient started having severe acid reflux syndrome in November 2017 and she ended up having an endoscopy on 01/28/2017 with also endoscopic ultrasound on 02/08/2017. Results of the pathology by biopsy of a fungating mass were discussed as above. According to the patient's report the tumor was HER-2 negative. She was initially

## 2017-05-06 NOTE — Progress Notes
therapy. I'm happy to see that patient has no involvement of the omentum or the peritoneum. I still believe she'll be a candidate for curative surgery if she has lost want to chemotherapy well. I will continue FOLFOX chemotherapy every 2 weeks and see her back again in 4 weeks with CBC CMP for follow-up. I spent more than 40 minutes including 30 minutes counseling time and answered all their questions to their satisfaction also reviewed the recent hospitalization for surgery as well    April 09, 2017   Labs were reviewed today and elected to continue with chemotherapy next week she will receive the day 15 of course 2 of chemotherapy with FOLFOX. . Advised to use the jejunostomy tube frequently also flush it with water to prevent dehydration . The patient wants to she will try to drink soups as well. . I plan to see her back again in 2-3 weeks for follow-up with CBC and CMP. After 6 treatments I plan to repeat a PET CT scan for follow-up I spent more than 40 minutes including 30 minutes counseling time and answered all their questions to their satisfaction     May 06, 2017  I am very happy to see that PET/CT scan showed complete remission without any evidence of malignancy at this time. I will continue with chemo therapy preferably total of 8 treatment before undergoing surgery. After surgery will be difficult to provide adjuvant chemotherapy due to poor tolerance. But I will wait for her visit with [facility] and see what the surgeons offered there, if they want to  do the surgery after the 6 treatment, as planned before that will be okay too, then I would consider providing further adjuvant chemotherapy after the surgery even though difficult to administer due to poor tolerance. I plan to see her back again in 4 weeks with CBC CMP CEA for follow-up and also for further discussion and hopefully, she make up her mind about who should be the surgeon. I reviewed the labs and elected to continue with chemo therapy.

## 2017-05-06 NOTE — Progress Notes
metastasis to the liver,Thus patient is pain staged as stage III, T3 N3 by EUS, HER-2/neu negative patient also had a J-tube placed for feeding purposes due to persistent nausea and vomiting,  Patient is here for follow-up, she has completed 2 courses of chemotherapy and tolerated it reasonably well although continued to have nausea. She has been using jejunostomy tube and tolerating with some eval. Although she is having some dehydration and Gave extra fluid last week. 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 Patient is been able to swallow better although continued to use jejunostomy tube for feeding. She has gained weight, feeling much better although continued Fatigue and tiredness. She has been seen at [facility] , but she would like to get second opinion from [facility] which will be next week. Patient has not decided who she wants to have surgery done although I strongly proposed that my experience with Dr. Freddrick March is remarkable that Dr. Freddrick March does minimal invasive surgery extremely well and has more experience than anybody in town. She would

## 2017-05-06 NOTE — Progress Notes
like to get an opinion from [facility] which she already arranged for next week. Since patient had such a good response I recommend to continue for a total of 8 treatment before undergoing surgery since after surgery it would be difficult to provide adjuvant chemo therapy.   Interim medical history, surgical history, family social history reviewed no change from previous visit July 2018      Past Surgical History:   Past Surgical History:   Procedure Laterality Date   ? BREAST RECONSTRUCTION  2016   ? CATARACT EXTRACTION W/ INTRAOCULAR LENS IMPLANT     ? COLONOSCOPY W/ BIOPSY  01/28/2017   ? EGD W/ BIOPSY  01/28/2017   ? EUS UPPER W/ FNA  02/08/2017   ? HYSTERECTOMY  2003    uterine fibroids   ? LASIK     ? MASTECTOMY  2011   Diagnostic laparoscopy and laparoscopic feeding jejunostomy tube placement 03/13/2017    Social History:    Social History     Social History   ? Marital status: Single     Spouse name: N/A   ? Number of children: 0   ? Years of education: N/A     Occupational History   ? Not on file.     Social History Main Topics   ? Smoking status: Former Smoker     Packs/day: 1.00     Years: 15.00     Types: Cigarettes     Start date: 06/24/1974     Quit date: 11/29/1988   ? Smokeless tobacco: Never Used   ? Alcohol use Yes   ? Drug use: Yes     Frequency: 7.0 times per week     Types: Marijuana   ? Sexual activity: Not on file     Other Topics Concern   ? Not on file     Social History Narrative   ? No narrative on file       Family History:    Family History   Problem Relation Age of Onset   ? Breast Cancer Mother    ? Prostate Cancer Father    ? Colon Cancer Father        Vital Signs:   Vitals:    05/06/17 1514   BP: 93/65   Pulse: 67   Resp: 17   Temp: 36.2 ?C (97.1 ?F)   Weight: 57.1 kg (125 lb 12.8 oz)   Height: 1.702 m (5\' 7" )     Vitals:    05/06/17 1514   BP: 93/65   Pulse: 67   Resp: 17   Temp: 36.2 ?C (97.1 ?F)   Weight: 57.1 kg (125 lb 12.8 oz)   Height: 1.702 m (5\' 7" )

## 2017-05-06 NOTE — Progress Notes
curative surgery. Also extensively discussed with him about the toxicities of chemo therapy which may include mild nausea vomiting alopecia myelosuppression, oxaliplatin-induced neuropathy, excessive sensitivity to cold temperature as well as occasional mucositis from 5-fluorouracil. Patient and her sister voiced her good understanding and are anxious to proceed with chemo therapy. I plan to see her back again in 10 days CBC CMP for follow-up  I also left the message with Dr. Robbie Lis to see whether he has sent specimen for [facility] molecular analysis if not I will send to [facility] for the same analysis  I spent more than 40 minutes including 30 minutes counseling time and answered all her questions to their satisfaction  March 07, 2017  This patient has persistent nausea vomiting and also has some obstructive symptoms from GE junction tumor, I have discussed the case with Dr. Freddrick March was suggested to have a jejunostomy tube placed for nutritional purposes. Dr. Freddrick March also talked to the patient to phone regarding the need of jejunostomy tube. Patient understand the situation and she is willing to proceed with the jejunostomy tube. Dr. Vilinda Blanks will make appropriate arrangements. I will give IV fluid today and tomorrow and advised to drink protein drinks as much she can. I also twice her that in today to have nausea vomiting she may need to go to the emergency room in May needs to be admitted to the hospital for jejunostomy tube and IV fluids. I plan to see her back again next week as planned before, I spent more than 35 minutes including 25 minutes counseling time and also discuss with Dr. Freddrick March    March 19, 2017  I'm happy to see that patient tolerated laparoscopic jejunostomy tube placement and currently tolerating the tube feeding very well. I have reviewed the labs and will proceed with second course of chemotherapy with FOLFOX at this time. I also used emend to prevent nausea so so with chemo

## 2017-05-06 NOTE — Progress Notes
evaluated by medical oncologist at [facility] but she preferred to transfer her treatment to this facility and also to the [place] [facility] and she has seen Dr. Awad also as of yesterday, who recommended an MRI of the liver as well as neoadjuvant chemotherapy. I do not have results of her PET/CT scan but the patient told me verbally that this was negative for liver lesions, initially described at a recent CT scan of the upper and lower abdomen. There was no other distant metastasis but there was lymphadenopathy associated with the stomach cancer. The patient is scheduled to undergo a Mediport placement by Dr. Perkins as of Friday 02/22/2017. She was suggested to have FOLFOX chemotherapy as soon as possible.  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 Patient couldn't tolerate tamoxifen, which has been discontinued. Patient also BRCA 1 and 2 negative in the past  Patient is here for the first course of chemotherapy with FOLFOX. She has many questions about her disease process as well as the biopsy results and also MRI results. I have discussed this in length with the patient and felt that currently she is only as stage III disease by MRI. And as per Dr. Awad, surgeons recommendation I will proceed with FOLFOX chemotherapy at this time. Patient still has mild dysphagia but able to bring labs of protein supplements. Denies any fever or chills cough or chest pain or

## 2017-05-06 NOTE — Progress Notes
shortness of breath. I left a message with Dr. Robbie Lis, oncologist from 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  Patient is here complains of increasing nausea and vomiting, not able to keep anything down., She is having obstructive symptoms., She had fluids yesterday as well. Denies any fever or chills or cough or chest pain feeling very tired and fatigued lost 2-3 pounds last few days, I have discussed with Dr. 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 Patient denies any fever or chills cough chest pain or shortness of breath. Eating only very minimal using the J-tube. 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

## 2017-05-06 NOTE — Progress Notes
there is a mass within the stomach consistent with patient's history of stomach cancer  I have reviewed these reports with the patient today March 04, 2017  EUS, staged as T3 N3, M1 although biopsy of the liver negative for malignancy as his MRI and the PET scan  04/25/2017 PET CT scan showed no evidence of residual or metastatic gastric cancer, no evidence of other malignancy, reviewed with the patient today 05/06/2017    Physical Exam:   Physical examination  Patient is alert, not in any acute distress , tired and fatigue, PS-0  HEENT, no pallor nor icterus, throat clear is a petechia  Neck, no lymphadenopathy, no thyromegaly, no lymphadenopathy  Respiratory system, bilateral mild wheezing no rhonchi or vascular  Cardiac, S1-S2 regular  Abdomen, soft nontender no hepatosplenomegaly, bowel sounds are active, no other mass palpable, the jejunostomy tube in place and functioning well  Musculoskeletal, no joint pain, except pain in the right foot with swelling  CNS, alert oriented ?3, able to more 4 extremities, mild peripheral neuropathy  No peripheral edema both feet  Psychiatric, not agitated or anxious        Assessment:    [name] with moderately to 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 The patient has a Mediport placed as of this Friday, 02/22/2017. I discussed today with her patient and her sister regarding her diagnosis, prognosis and treatments for this type of tumor. I explained in great detail the effects and side effects of chemotherapy especially neuropathy with oxaliplatin possible chest pain or discomfort with 5-FU, less of alopecia and no major other side effects. In general this tablet treatment is relatively well tolerated without excessive cytopenias. I prescribed antinausea medications in case of

## 2017-05-14 ENCOUNTER — Ambulatory Visit: Primary: Internal Medicine

## 2017-05-14 ENCOUNTER — Encounter: Attending: Hematology & Oncology | Primary: Internal Medicine

## 2017-05-14 DIAGNOSIS — C169 Malignant neoplasm of stomach, unspecified: Principal | ICD-10-CM

## 2017-05-15 ENCOUNTER — Inpatient Hospital Stay: Primary: Internal Medicine

## 2017-05-16 ENCOUNTER — Ambulatory Visit: Primary: Internal Medicine

## 2017-05-17 ENCOUNTER — Encounter: Primary: Internal Medicine

## 2017-05-23 ENCOUNTER — Ambulatory Visit: Admit: 2017-05-23 | Discharge: 2017-05-23 | Attending: Hematology & Oncology | Primary: Internal Medicine

## 2017-05-23 DIAGNOSIS — D219 Benign neoplasm of connective and other soft tissue, unspecified: Secondary | ICD-10-CM

## 2017-05-23 DIAGNOSIS — C160 Malignant neoplasm of cardia: Principal | ICD-10-CM

## 2017-05-23 DIAGNOSIS — R1013 Epigastric pain: Secondary | ICD-10-CM

## 2017-05-23 DIAGNOSIS — M810 Age-related osteoporosis without current pathological fracture: Secondary | ICD-10-CM

## 2017-05-23 DIAGNOSIS — C50911 Malignant neoplasm of unspecified site of right female breast: Secondary | ICD-10-CM

## 2017-05-23 DIAGNOSIS — Z87891 Personal history of nicotine dependence: Secondary | ICD-10-CM

## 2017-05-23 DIAGNOSIS — E039 Hypothyroidism, unspecified: Secondary | ICD-10-CM

## 2017-05-23 DIAGNOSIS — R5383 Other fatigue: Secondary | ICD-10-CM

## 2017-05-23 DIAGNOSIS — M1990 Unspecified osteoarthritis, unspecified site: Secondary | ICD-10-CM

## 2017-05-23 DIAGNOSIS — K219 Gastro-esophageal reflux disease without esophagitis: Principal | ICD-10-CM

## 2017-05-23 DIAGNOSIS — F419 Anxiety disorder, unspecified: Secondary | ICD-10-CM

## 2017-05-23 DIAGNOSIS — K769 Liver disease, unspecified: Secondary | ICD-10-CM

## 2017-05-23 DIAGNOSIS — Z800 Family history of malignant neoplasm of digestive organs: Secondary | ICD-10-CM

## 2017-05-23 DIAGNOSIS — C169 Malignant neoplasm of stomach, unspecified: Secondary | ICD-10-CM

## 2017-05-23 DIAGNOSIS — Z8042 Family history of malignant neoplasm of prostate: Secondary | ICD-10-CM

## 2017-05-23 DIAGNOSIS — R634 Abnormal weight loss: Secondary | ICD-10-CM

## 2017-05-23 DIAGNOSIS — R9389 Abnormal findings on diagnostic imaging of other specified body structures: Secondary | ICD-10-CM

## 2017-05-23 DIAGNOSIS — Z9221 Personal history of antineoplastic chemotherapy: Secondary | ICD-10-CM

## 2017-05-23 DIAGNOSIS — M545 Low back pain: Secondary | ICD-10-CM

## 2017-05-23 DIAGNOSIS — R1310 Dysphagia, unspecified: Secondary | ICD-10-CM

## 2017-05-23 DIAGNOSIS — Z803 Family history of malignant neoplasm of breast: Secondary | ICD-10-CM

## 2017-05-28 ENCOUNTER — Ambulatory Visit: Primary: Internal Medicine

## 2017-05-28 NOTE — Progress Notes
evaluated by medical oncologist at [facility] but she preferred to transfer her treatment to this facility and also to the [place] [facility] and she has seen Dr. Awad also as of yesterday, who recommended an MRI of the liver as well as neoadjuvant chemotherapy. I do not have results of her PET/CT scan but the patient told me verbally that this was negative for liver lesions, initially described at a recent CT scan of the upper and lower abdomen. There was no other distant metastasis but there was lymphadenopathy associated with the stomach cancer. The patient is scheduled to undergo a Mediport placement by Dr. Perkins as of Friday 02/22/2017. She was suggested to have FOLFOX chemotherapy as soon as possible.  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 Patient couldn't tolerate tamoxifen, which has been discontinued. Patient also BRCA 1 and 2 negative in the past  Patient is here for the first course of chemotherapy with FOLFOX. She has many questions about her disease process as well as the biopsy results and also MRI results. I have discussed this in length with the patient and felt that currently she is only as stage III disease by MRI. And as per Dr. Awad, surgeons recommendation I will proceed with FOLFOX chemotherapy at this time. Patient still has mild dysphagia but able to bring labs of protein supplements. Denies any fever or chills cough or chest pain or

## 2017-05-28 NOTE — Progress Notes
cold temperature as well as occasional mucositis from 5-fluorouracil. Patient and her sister voiced her good understanding and are anxious to proceed with chemo therapy. I plan to see her back again in 10 days CBC CMP for follow-up  I also left the message with Dr. Robbie Lis to see whether he has sent specimen for [facility] molecular analysis if not I will send to [facility] for the same analysis  I spent more than 40 minutes including 30 minutes counseling time and answered all her questions to their satisfaction  March 07, 2017  This patient has persistent nausea vomiting and also has some obstructive symptoms from GE junction tumor, I have discussed the case with Dr. Freddrick March was suggested to have a jejunostomy tube placed for nutritional purposes. Dr. Freddrick March also talked to the patient to phone regarding the need of jejunostomy tube. Patient understand the situation and she is willing to proceed with the jejunostomy tube. Dr. Vilinda Blanks will make appropriate arrangements. I will give IV fluid today and tomorrow and advised to drink protein drinks as much she can. I also twice her that in today to have nausea vomiting she may need to go to the emergency room in May needs to be admitted to the hospital for jejunostomy tube and IV fluids. I plan to see her back again next week as planned before, I spent more than 35 minutes including 25 minutes counseling time and also discuss with Dr. Freddrick March    March 19, 2017  I'm happy to see that patient tolerated laparoscopic jejunostomy tube placement and currently tolerating the tube feeding very well. I have reviewed the labs and will proceed with second course of chemotherapy with FOLFOX at this time. I also used emend to prevent nausea so so with chemo therapy. I'm happy to see that patient has no involvement of the omentum or the peritoneum. I still believe she'll be a candidate for curative surgery if she has lost want to chemotherapy well. I will continue FOLFOX

## 2017-05-28 NOTE — Progress Notes
? Potassium 04/11/2017 4.0    ? Chloride 04/11/2017 102    ? CARBON DIOXIDE 04/11/2017 26    ? Calcium 04/11/2017 9.4    ? Protein, Total 04/11/2017 5.8*   ? ALBUMIN 04/11/2017 3.6    ? Globulin 04/11/2017 2.2    ? ALBUMIN/GLOBULIN RATIO 04/11/2017 1.6    ? Total Bilirubin 04/11/2017 0.3    ? Alkaline Phosphatase 04/11/2017 91    ? AST 04/11/2017 24    ? ALT 04/11/2017 40*   ? CEA 04/11/2017 2.0    Hospital Outpatient Visit on 04/02/2017   Component Date Value   ? WBC 04/02/2017 8.32    ? RBC 04/02/2017 3.52    ? Hemoglobin 04/02/2017 11.0*   ? Hematocrit 04/02/2017 33.1*   ? MCV 04/02/2017 94.0    ? Climax 04/02/2017 31.3    ? MCHC 04/02/2017 33.2    ? RDW 04/02/2017 12.6    ? Platelet Count 04/02/2017 196    ? MPV 04/02/2017 10.3    ? Neutrophils Absolute 04/02/2017 5.30    ? Neutrophils % 04/02/2017 63.6    ? Lymphocytes Absolute 04/02/2017 1.67    ? Lymphocytes % 04/02/2017 20.1*   ? Monocytes Absolute 04/02/2017 1.13    ? Monocytes % 04/02/2017 13.6    ? Eosinophils Absolute 04/02/2017 0.18    ? Eosinophils % 04/02/2017 2.2    ? Basophil Absolute 04/02/2017 0.04    ? BASOPHILS 04/02/2017 0.5    Lab Requisition on 04/01/2017   Component Date Value   ? Materials Received  04/01/2017                      Value:This result contains rich text formatting which cannot be displayed here.   ? Case Report 04/01/2017                      Value:Surgical Pathology Report                         Case: [id number]                                  Authorizing Provider:  Gracy Bruins, MD           Collected:           04/01/2017 [id number]              Pathologist:           Wilfrid Lund, MD     Received:            04/01/2017 [phone]              Specimen:    Consult                                                                                   ? Interpretation 04/01/2017                      Value:This result contains rich text formatting which cannot be displayed here.   ?  Attestation Statement 04/01/2017

## 2017-05-28 NOTE — Progress Notes
Value:This result contains rich text formatting which cannot be displayed here.     02/19/2017 CBC, CMP grossly normal, CEA 1.5, LDH 124  03/19/2017 CBC grossly normal except hemoglobin 11.5 CMP within normal limits, reviewed with the patient today March 19, 2017  04/02/2017 CBC grossly normal except hemoglobin 11.0 CMP grossly normal, reviewed with the patient and her sister today April 08, 2017 ,   05/01/2017 CBC grossly normal except hemoglobin 10.7, platelet 10/30/1998, CMP grossly normal except albumin 3.3, AST 103, reviewed with the patient today May 06, 2017    5/4/2018A. 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 SEE COMMENT    B. ANTRUM (ENDOSCOPIC BIOPSY):   -  MILD CHRONIC GASTRITIS WITH MINIMAL ACTIVITY.   -  NO HELICOBACTER PYLORI IDENTIFIED BY MORPHOLOGICAL EVALUATION.   -  NO INTESTINAL METAPLASIA, DYSPLASIA OR MALIGNANCY IDENTIFIED.    C. POLYP DESCENDING (ENDOSCOPIC BIOPSY):   -  TUBULAR ADENOMA   -  NEGATIVE FOR HIGH-GRADE DYSPLASIA AND MALIGNANCY  HER2 Non-Breast: NEGATIVE  Score: 1+    02/08/2017. LIVER (FINE NEEDLE ASPIRATION):   -  NO EVIDENCE OF MALIGNANCY IDENTIFIED.    -  SMALL DETACHED FRAGMENTS OF LIVER PARENCHYMA AND SUPERFICIAL GASTRIC        FOVEOLAR EPITHELIUM.    Imaging Results:     02/14/2017, PET/CT scan showed FDG uptake in the anterior wall of the antrum consistent with malignancy, 1.87 with a lymph node within the stomach and liver, no obvious liver metastasis  02/21/2017, MRI of the abdomen there is 4-5 mm nonenhancing lesion noted within the left lobe of the liver which were present and atypical cyst or small hemangioma no obvious additional lesions are seen within the liver, there is a mass within the stomach consistent with patient's history of stomach cancer  I have reviewed these reports with the patient today March 04, 2017  EUS, staged as T3 N3, M1 although biopsy of the liver negative for

## 2017-05-28 NOTE — Progress Notes
like to get an opinion from [facility] which she already arranged for next week. Since patient had such a good response I recommend to continue for a total of 8 treatment before undergoing surgery since after surgery it would be difficult to provide adjuvant chemo therapy.   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 She also had a J-tube placed for feeding purposes as well.  Patient's here for a follow-up regarding her cancer as well as chemo therapy. She was seen in [facility] by surgeon as well as oncologists Dr. Benjie Karvonen who agreed with current plan of care including completion of 8 treatment with FOLFOX followed by surgery. Patient would like to have surgery done in [facility] and has been scheduled for end of October. Patient is been feeling much better now able to swallow and keeping it down continues to use J-tube for nutritional purposes. She has been feeling a bit tired and fatigued but much better than before start having some numbness and tingling in hands and feet. Denies any nausea vomiting diarrhea lately,  Interim medical history, surgical history, family history social history were reviewed no change from previous visit      Past Surgical History:   Past Surgical History:   Procedure Laterality Date   ? BREAST RECONSTRUCTION  2016   ? CATARACT EXTRACTION W/ INTRAOCULAR LENS IMPLANT     ? COLONOSCOPY W/ BIOPSY  01/28/2017   ? EGD W/ BIOPSY  01/28/2017   ? EUS UPPER W/ FNA  02/08/2017   ? HYSTERECTOMY  2003    uterine fibroids   ? LASIK     ? MASTECTOMY  2011

## 2017-05-28 NOTE — Progress Notes
?   Lymphocytes Absolute 05/01/2017 2.14    ? Lymphocytes % 05/01/2017 37.0    ? Monocytes Absolute 05/01/2017 0.84    ? Monocytes % 05/01/2017 14.5    ? Eosinophils Absolute 05/01/2017 0.17    ? Eosinophils % 05/01/2017 2.9    ? Basophil Absolute 05/01/2017 0.04    ? BASOPHILS 05/01/2017 0.7    Autoreleased Orders on 04/15/2017   Component Date Value   ? Glucose 05/01/2017 97    ? Urea Nitrogen 05/01/2017 15    ? Creatinine 05/01/2017 0.58    ? EGFR 05/01/2017 102    ? Glom Filt Rate, Est Afri* 05/01/2017 118    ? BUN/Creatinine Ratio 09/98/3382 NOT APPLICABLE    ? Sodium 05/01/2017 140    ? Potassium 05/01/2017 4.3    ? Chloride 05/01/2017 106    ? CARBON DIOXIDE 05/01/2017 29    ? Calcium 05/01/2017 8.9    ? Protein, Total 05/01/2017 5.2*   ? ALBUMIN 05/01/2017 3.3*   ? Globulin 05/01/2017 1.9    ? ALBUMIN/GLOBULIN RATIO 05/01/2017 1.7    ? Total Bilirubin 05/01/2017 0.4    ? Alkaline Phosphatase 05/01/2017 97    ? AST 05/01/2017 61*   ? ALT 05/01/2017 109*   Office Visit on 04/08/2017   Component Date Value   ? WHITE BLOOD CELL COUNT 04/11/2017 5.9    ? RBC 04/11/2017 3.64*   ? Hemoglobin 04/11/2017 11.3*   ? Hematocrit 04/11/2017 34.0*   ? MCV 04/11/2017 93.4    ? [facility] 04/11/2017 31.0    ? MCHC 04/11/2017 33.2    ? RDW 04/11/2017 12.4    ? Platelets 04/11/2017 266    ? MPV 04/11/2017 10.7    ? Neutrophils Absolute 04/11/2017 1894    ? Lymphocytes Absolute 04/11/2017 2631    ? Monocytes Absolute 04/11/2017 1245*   ? Eosinophils Absolute 04/11/2017 89    ? Basophils Absolute 04/11/2017 41    ? Neutrophils 04/11/2017 32.1    ? LYMPHOCYTES 04/11/2017 44.6    ? MONOCYTES 04/11/2017 21.1    ? EOSINOPHILS 04/11/2017 1.5    ? BASOPHILS 04/11/2017 0.7    ? COMMENT(S) 04/11/2017     ? Glucose 04/11/2017 96    ? Urea Nitrogen 04/11/2017 18    ? Creatinine 04/11/2017 0.81    ? EGFR 04/11/2017 80    ? Glom Filt Rate, Est Afri* 04/11/2017 93    ? BUN/Creatinine Ratio 50/53/9767 NOT APPLICABLE    ? Sodium 04/11/2017 141

## 2017-05-28 NOTE — Progress Notes
chemotherapy every 2 weeks and see her back again in 4 weeks with CBC CMP for follow-up. I spent more than 40 minutes including 30 minutes counseling time and answered all their questions to their satisfaction also reviewed the recent hospitalization for surgery as well    April 09, 2017   Labs were reviewed today and elected to continue with chemotherapy next week she will receive the day 15 of course 2 of chemotherapy with FOLFOX. . Advised to use the jejunostomy tube frequently also flush it with water to prevent dehydration . The patient wants to she will try to drink soups as well. . I plan to see her back again in 2-3 weeks for follow-up with CBC and CMP. After 6 treatments I plan to repeat a PET CT scan for follow-up I spent more than 40 minutes including 30 minutes counseling time and answered all their questions to their satisfaction     May 06, 2017  I am very happy to see that PET/CT scan showed complete remission without any evidence of malignancy at this time. I will continue with chemo therapy preferably total of 8 treatment before undergoing surgery. After surgery will be difficult to provide adjuvant chemotherapy due to poor tolerance. But I will wait for her visit with [facility] and see what the surgeons offered there, if they want to  do the surgery after the 6 treatment, as planned before that will be okay too, then I would consider providing further adjuvant chemotherapy after the surgery even though difficult to administer due to poor tolerance. I plan to see her back again in 4 weeks with CBC CMP CEA for follow-up and also for further discussion and hopefully, she make up her mind about who should be the surgeon. I reviewed the labs and elected to continue with chemo therapy.  I spent more than 40 minutes including 30 minutes counseling time and answered all her questions to her satisfaction  May 23, 2017  Patient had good experience in [facility] and able to see oncologist Dr.

## 2017-05-28 NOTE — Progress Notes
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 She had a wellness check in March 2018 and this was completely negative. The patient also has history of breast cancer in 2010 and she is status post right mastectomy with breast reconstruction. She did not receive chemotherapy but she was treated with Herceptin for 50 months by an oncologist in [place]. According to her report she could not tolerate tamoxifen. In 2016 she had an episode of vertigo and CT scan of the head showed dural thickness.    The patient has never noticed blood in the stool or in the urine, she never had the yellow jaundice, she never had hepatitis or blood transfusions. She has lost approximately 20 pounds since December 2017. She has difficulties with nutrition especially with solid food, as she has to cut the food in small pieces and chew for long time and she always gets pain postprandially.    The patient used to be a smoker for 10 years but she quit in 1989 and she smoked approximately one pack per day. She used to drink wine.    Regarding her family history her father has dementia and heart disease. Her mother had breast cancer at the age of 20 her great-grandmother also had breast cancer. There is no ovarian cancer in the family. Her father has history of prostate cancer and colon cancer. Her paternal grandmother had also colon cancer.    03/04/2017    Ms. 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 The patient started having severe acid reflux syndrome in November 2017 and she ended up having an endoscopy on 01/28/2017 with also endoscopic ultrasound on 02/08/2017. Results of the pathology by biopsy of a fungating mass were discussed as above. According to the patient's report the tumor was HER-2 negative. She was initially

## 2017-05-28 NOTE — Progress Notes
metastasis to the liver,Thus patient is pain staged as stage III, T3 N3 by EUS, HER-2/neu negative patient also had a J-tube placed for feeding purposes due to persistent nausea and vomiting,  Patient is here for follow-up, she has completed 2 courses of chemotherapy and tolerated it reasonably well although continued to have nausea. She has been using jejunostomy tube and tolerating with some eval. Although she is having some dehydration and Gave extra fluid last week. 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 Patient is been able to swallow better although continued to use jejunostomy tube for feeding. She has gained weight, feeling much better although continued Fatigue and tiredness. She has been seen at [facility] , but she would like to get second opinion from [facility] which will be next week. Patient has not decided who she wants to have surgery done although I strongly proposed that my experience with Dr. Freddrick March is remarkable that Dr. Freddrick March does minimal invasive surgery extremely well and has more experience than anybody in town. She would

## 2017-05-28 NOTE — Progress Notes
Diagnostic laparoscopy and laparoscopic feeding jejunostomy tube placement 03/13/2017    Social History:    Social History     Social History   ? Marital status: Single     Spouse name: N/A   ? Number of children: 0   ? Years of education: N/A     Occupational History   ? Not on file.     Social History Main Topics   ? Smoking status: Former Smoker     Packs/day: 1.00     Years: 15.00     Types: Cigarettes     Start date: 06/24/1974     Quit date: 11/29/1988   ? Smokeless tobacco: Never Used   ? Alcohol use Yes   ? Drug use: Yes     Frequency: 7.0 times per week     Types: Marijuana   ? Sexual activity: Not on file     Other Topics Concern   ? Not on file     Social History Narrative   ? No narrative on file       Family History:    Family History   Problem Relation Age of Onset   ? Breast Cancer Mother    ? Prostate Cancer Father    ? Colon Cancer Father        Vital Signs:   Vitals:    05/23/17 1415   BP: 101/70   Pulse: 72   Resp: 16   Temp: 36.7 ?C (98.1 ?F)   Weight: 60.2 kg (132 lb 12.8 oz)   Height: 1.702 m (5\' 7" )     Vitals:    05/23/17 1415   BP: 101/70   Pulse: 72   Resp: 16   Temp: 36.7 ?C (98.1 ?F)   Weight: 60.2 kg (132 lb 12.8 oz)   Height: 1.702 m (5\' 7" )       Allergies: Patient has no known allergies.    Review of Systems:    Constitutional:No more weight loss, mild fatigue, No fever, able to swallow real food  Eyes: No double vision  Ears,Nose, Mouth,Throat: No sinus trouble, No sore throat  Cardiac: No lightheadedness, No swelling in legs, No chest pain  Respiratory: No cough, No hemoptysis, No shortness of breath  Gastrointestinal:  Mild nausea, no vomiting No diarrhea, No abdominal pain. Mild dysphagia, jejunostomy tube in place  Genitourinary: No burning on urination  Musculosketal: No bone pain.   Skin: No skin rash  Neurological: No seizures, No loss of balance, No weakness of limbs, mild numbness of both feet  Psychiatric: Deferred

## 2017-05-28 NOTE — Progress Notes
Chauncey Reading, MD  [address]  [place] [address]  [facility] [place] [address]    Chief Complaint   Patient presents with   ? Follow-up     Adenocarcinoma of gastric cardia   The patient has been referred to this clinic for workup and evaluation and treatment of adenocarcinoma of the GE junction.    Patient Name: Mariah Cameron      Date Of Birth: 1956-08-27    History of Present Illness:      Ms. 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 The patient started having severe acid reflux syndrome in November 2017 and she ended up having an endoscopy on 01/28/2017 with also endoscopic ultrasound on 02/08/2017. Results of the pathology by biopsy of a fungating mass were discussed as above. According to the patient's report the tumor was HER-2 negative. She was initially evaluated by medical oncologist at [facility] [place] but she preferred to transfer her treatment to this facility and also to the [place] and she has seen Dr. Freddrick March also as of yesterday, who recommended an MRI of the liver as well as neoadjuvant chemotherapy. I do not have results of her PET/CT scan but the patient told me verbally that this was negative for liver lesions, initially described at a recent CT scan of the upper and lower abdomen. There was no other distant metastasis but there was lymphadenopathy associated with the stomach cancer. The patient is scheduled to undergo a Mediport placement by Dr. Dara Lords as of Friday 02/22/2017. She was suggested to have FOLFOX chemotherapy as soon as possible.    Her past medical history significant for osteoporosis as well as

## 2017-05-28 NOTE — Progress Notes
to start tentatively on Wednesday, Feb 27, 2017., Achieved complete remission by PET CT scan 04/25/2017    Given her heavy family history of breast cancer, colon cancer, prostate cancer, I have requested a genetic evaluation testing by myriad laboratories. At [facility] M[facility]D. [place] Foundation testing was requested and results need to be followed for molecular biology of the tumor to include MSI status or other clinically relevant molecular markers.    The patient will return to this clinic for follow-up, most probable with Dr. Lurena Joiner, in 2 weeks after her first round of chemotherapy to assess for toxicity and possible side effects.    The patient and her sister had a long list of questions regarding diagnosis, treatment, sequencing of multi modality treatments, side effects of chemotherapy and this questions were answered to the best of my knowledge during this initial evaluation.    It is of note is that the knee CT scan of the chest abdomen and pelvis from Feb 05, 2017 to indeterminate subcentimeter low-density lesions were noted within the lateral segment of the left hepatic lobe. These were biopsied by fine-needle aspiration at [facility] and were negative for malignancy. Most recent PET CT scan at Dr. Dario Guardian facility did not confirm liver metastasis.    With today's evaluation also I have requested baseline laboratory testing to include a CBC, CMP, LDH, CEA.  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 If she does have a good response she'll be a candidate for curative surgery. Also extensively discussed with him about the toxicities of chemo therapy which may include mild nausea vomiting alopecia myelosuppression, oxaliplatin-induced neuropathy, excessive sensitivity to

## 2017-05-28 NOTE — Progress Notes
malignancy as his MRI and the PET scan  04/25/2017 PET CT scan showed no evidence of residual or metastatic gastric cancer, no evidence of other malignancy, reviewed with the patient today 05/06/2017    Physical Exam:   Physical examination  Patient is alert, not in any acute distress , tired and fatigue, PS-0  HEENT, no pallor nor icterus, throat clear is a petechia  Neck, no lymphadenopathy, no thyromegaly, no lymphadenopathy  Respiratory system, bilateral mild wheezing no rhonchi or vascular  Cardiac, S1-S2 regular  Abdomen, soft nontender no hepatosplenomegaly, bowel sounds are active, no other mass palpable, the jejunostomy tube in place and functioning well  Musculoskeletal, no joint pain, except pain in the right foot with swelling  CNS, alert oriented ?3, able to more 4 extremities, mild peripheral neuropathy  No peripheral edema both feet  Psychiatric, not agitated or anxious        Assessment:    [name] with 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 The patient has a Mediport placed as of this Friday, 02/22/2017. I discussed today with her patient and her sister regarding her diagnosis, prognosis and treatments for this type of tumor. I explained in great detail the effects and side effects of chemotherapy especially neuropathy with oxaliplatin possible chest pain or discomfort with 5-FU, less of alopecia and no major other side effects. In general this tablet treatment is relatively well tolerated without excessive cytopenias. I prescribed antinausea medications in case of chemotherapy associated nausea and vomiting, to her local pharmacy. We did add toward with the patient and the sister of our chemotherapy unit and introduce her to our chemotherapy nurses. I have prescribed chemotherapy

## 2017-05-28 NOTE — Progress Notes
Mody and Psychologist, sport and exercise. Patient decided to have her surgery done in [facility] which will be end of October. As preparation request I am holding one treatment this week and she will back again chemo therapy after holiday. Plan remain the same to give a course of chemotherapy and will have 4 weeks break before undergoing surgery. Patient tells me that Dr. Benjie Karvonen suggested to give him 12 treatments before the surgery but when I talked to Dr. Benjie Karvonen today  his recommendation remain the same to have the surgery after the 8 treatment and following surgery if feasible will give 4 more course of chemo therapy. I plan to see her back again in 2 weeks for follow-up as preparation request. I spent more than 35 minutes including 35 minutes counseling time answered all her questions to her satisfaction. I also discuss with Dr.Mody from [facility] as well  For rest of her medical problem and continue with current management and follow with her primary physician. I have encouraged her to call me directly if she has any questions or if I can be of any further assistance. Results of most recent studies discussed with patient. All questions were answered to patient's satisfaction. Patient agreeable to the above plan. More than 50% of that time was spent on coordination of care, counseling, further diagnostic and therapeutic clinic and follow-up care. This document was created using voice recognition software. Inadvertent typographical errors, word omissions and/or word substitutions may exist.         Return to Clinic:  in 4 weeks.  Dr. Rosaura Carpenter,  Dr. Carmelina Paddock, Dr. Freddrick March  .

## 2017-05-28 NOTE — Progress Notes
shortness of breath. I left a message with Dr. Robbie Lis, oncologist from 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  Patient is here complains of increasing nausea and vomiting, not able to keep anything down., She is having obstructive symptoms., She had fluids yesterday as well. Denies any fever or chills or cough or chest pain feeling very tired and fatigued lost 2-3 pounds last few days, I have discussed with Dr. 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 Patient denies any fever or chills cough chest pain or shortness of breath. Eating only very minimal using the J-tube. 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

## 2017-05-28 NOTE — Progress Notes
Hematologic/Lymphatic/Immunologic: No bleeding, No lumps in arm pits  May 23, 2017, 12 system review done as above    Medications:   Current Outpatient Prescriptions:   ?  ALPRAZolam (XANAX) 0.5 MG PO Tablet, TK 1 T PO QHS, Disp: , Rfl: 2  ?  amoxicillin-clavulanate (AUGMENTIN) 400-57 MG PO Tablet Chewable, Chew 1 tablet 2 times daily for 1 day., Disp: 2 tablet, Rfl: 0  ?  ARMOUR THYROID 15 MG PO Tablet, TK 3 TS PO ONCE D OES, Disp: , Rfl: 3  ?  CARAFATE 1 GM/10ML PO Suspension, TK 10 ML PO TID OES 1 HOUR B MEALS AND HS, Disp: , Rfl: 2  ?  diclofenac (VOLTAREN GEL) 1 % TD Gel, APP AA BID UTD, Disp: , Rfl: 0  ?  eszopiclone (LUNESTA) 3 MG PO Tablet, , Disp: , Rfl: 2  ?  HYDROcodone-acetaminophen (NORCO) 10-325 MG PO Tablet, TK 1 T PO Q 6 H PRN, Disp: , Rfl: 0  ?  HYDROcodone-acetaminophen (NORCO) 5-325 MG PO Tablet, TK 1-2 TS PO Q 6 H, Disp: , Rfl: 0  ?  ibuprofen (ADVIL,MOTRIN) 600 MG PO Tablet, Take 1 tablet by mouth 3 times daily for 5 days., Disp: 15 tablet, Rfl: 0  ?  lidocaine-prilocaine (EMLA) 2.5-2.5 % EX Cream, Apply 30 minutes prior to treatment, Disp: 60 g, Rfl: 1  ?  omeprazole (PriLOSEC) 40 MG PO Capsule Delayed Release, TK 1 C PO QD AC, Disp: , Rfl: 5  ?  ondansetron (ZOFRAN-ODT) 8 MG PO Tablet Disintegrating, Dissolve 1 tablet in mouth every 8 hours as needed for nausea., Disp: 30 tablet, Rfl: 11  ?  prochlorperazine (COMPAZINE) 10 MG PO Tablet, Take 1 tablet by mouth every 6 hours as needed., Disp: 60 tablet, Rfl: 11  ?  QUEtiapine (SEROquel) 50 MG PO Tablet, , Disp: , Rfl: 0      Labs:   Hospital Outpatient Visit on 05/01/2017   Component Date Value   ? WBC 05/01/2017 5.79    ? RBC 05/01/2017 3.42    ? Hemoglobin 05/01/2017 10.7*   ? Hematocrit 05/01/2017 32.0*   ? MCV 05/01/2017 93.6    ? [place] 05/01/2017 31.3    ? MCHC 05/01/2017 33.4    ? RDW 05/01/2017 13.7    ? Platelet Count 05/01/2017 132*   ? MPV 05/01/2017 10.8    ? Neutrophils Absolute 05/01/2017 2.60    ? Neutrophils % 05/01/2017 44.9

## 2017-05-28 NOTE — Progress Notes
Mariah Reading, MD  [address]  [place] [address]  [facility] [place] [address]    Chief Complaint   Patient presents with   ? Follow-up     Adenocarcinoma of gastric cardia   The patient has been referred to this clinic for workup and evaluation and treatment of adenocarcinoma of the GE junction.    Patient Name: Mariah Cameron      Date Of Birth: 1956-01-01    History of Present Illness:    Date: 02/19/2017    Ms. 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 The patient started having severe acid reflux syndrome in November 2017 and she ended up having an endoscopy on 01/28/2017 with also endoscopic ultrasound on 02/08/2017. Results of the pathology by biopsy of a fungating mass were discussed as above. According to the patient's report the tumor was HER-2 negative. She was initially evaluated by medical oncologist at [facility] M[facility] [place] but she preferred to transfer her treatment to this facility and also to the [place] and she has seen Dr. Freddrick March also as of yesterday, who recommended an MRI of the liver as well as neoadjuvant chemotherapy. I do not have results of her PET/CT scan but the patient told me verbally that this was negative for liver lesions, initially described at a recent CT scan of the upper and lower abdomen. There was no other distant metastasis but there was lymphadenopathy associated with the stomach cancer. The patient is scheduled to undergo a Mediport placement by Dr. Dara Lords as of Friday 02/22/2017. She was suggested to have FOLFOX chemotherapy as soon as possible.    Her past medical history significant for osteoporosis as well as

## 2017-05-29 ENCOUNTER — Encounter: Primary: Internal Medicine

## 2017-05-30 ENCOUNTER — Ambulatory Visit: Primary: Internal Medicine

## 2017-05-31 ENCOUNTER — Encounter: Primary: Internal Medicine

## 2017-06-04 ENCOUNTER — Inpatient Hospital Stay: Admit: 2017-06-04 | Discharge: 2017-06-05 | Primary: Internal Medicine

## 2017-06-04 DIAGNOSIS — C169 Malignant neoplasm of stomach, unspecified: Principal | ICD-10-CM

## 2017-06-04 DIAGNOSIS — C160 Malignant neoplasm of cardia: Secondary | ICD-10-CM

## 2017-06-04 DIAGNOSIS — Z5111 Encounter for antineoplastic chemotherapy: Secondary | ICD-10-CM

## 2017-06-04 MED ORDER — FLUOROURACIL INFUSION AMB PUMP BCN JX
2400 mg/m2 | Freq: Once | INTRAVENOUS | Status: DC
Start: 2017-06-04 — End: 2017-06-05

## 2017-06-04 MED ORDER — OXALIPLATIN INFUSION BCN JX
85 mg/m2 | Freq: Once | INTRAVENOUS | Status: DC
Start: 2017-06-04 — End: 2017-06-05

## 2017-06-04 MED ORDER — FOSAPREPITANT IVPB JX
150 mg | Freq: Once | INTRAVENOUS | Status: CP
Start: 2017-06-04 — End: ?

## 2017-06-04 MED ORDER — LORAZEPAM 2 MG/ML IJ SOLN
1 mg | Freq: Once | INTRAVENOUS | Status: CP
Start: 2017-06-04 — End: ?

## 2017-06-04 MED ORDER — LEUCOVORIN IVPB JX
400 mg/m2 | Freq: Once | INTRAVENOUS | Status: CP
Start: 2017-06-04 — End: ?

## 2017-06-04 MED ORDER — ONDANSETRON WITH DEXAMETHASONE IVPB (NON-SPECIFIED)
Freq: Once | INTRAVENOUS | Status: CP
Start: 2017-06-04 — End: ?

## 2017-06-04 MED ORDER — DEXTROSE 5 % IV SOLN
Freq: Once | INTRAVENOUS | Status: CP
Start: 2017-06-04 — End: ?

## 2017-06-04 MED ORDER — FLUOROURACIL IV SYRINGE BCN JX
400 mg/m2 | Freq: Once | INTRAVENOUS | Status: CP
Start: 2017-06-04 — End: ?

## 2017-06-04 NOTE — Progress Notes
Prior to treatment patient c/o nausea and fatigue stating she has not been taking oral Ativan due to starting medicinal marijuana. Patient requested IV Ativan as this helps with anxiety and nausea. Above information shared with pharmacy and physician. Dr. Lurena Joiner advised treat with 1mg  Ativan on day one of treatment cycles.

## 2017-06-06 ENCOUNTER — Ambulatory Visit: Admit: 2017-06-06 | Discharge: 2017-06-07 | Attending: Hematology & Oncology | Primary: Internal Medicine

## 2017-06-06 ENCOUNTER — Inpatient Hospital Stay: Admit: 2017-06-06 | Discharge: 2017-06-07 | Primary: Internal Medicine

## 2017-06-06 DIAGNOSIS — C169 Malignant neoplasm of stomach, unspecified: Secondary | ICD-10-CM

## 2017-06-06 DIAGNOSIS — D219 Benign neoplasm of connective and other soft tissue, unspecified: Secondary | ICD-10-CM

## 2017-06-06 DIAGNOSIS — E039 Hypothyroidism, unspecified: Secondary | ICD-10-CM

## 2017-06-06 DIAGNOSIS — C160 Malignant neoplasm of cardia: Principal | ICD-10-CM

## 2017-06-06 DIAGNOSIS — K769 Liver disease, unspecified: Secondary | ICD-10-CM

## 2017-06-06 DIAGNOSIS — Z87891 Personal history of nicotine dependence: Secondary | ICD-10-CM

## 2017-06-06 DIAGNOSIS — Z800 Family history of malignant neoplasm of digestive organs: Secondary | ICD-10-CM

## 2017-06-06 DIAGNOSIS — Z79899 Other long term (current) drug therapy: Secondary | ICD-10-CM

## 2017-06-06 DIAGNOSIS — M810 Age-related osteoporosis without current pathological fracture: Secondary | ICD-10-CM

## 2017-06-06 DIAGNOSIS — K219 Gastro-esophageal reflux disease without esophagitis: Principal | ICD-10-CM

## 2017-06-06 DIAGNOSIS — R1310 Dysphagia, unspecified: Secondary | ICD-10-CM

## 2017-06-06 DIAGNOSIS — Z8042 Family history of malignant neoplasm of prostate: Secondary | ICD-10-CM

## 2017-06-06 DIAGNOSIS — Z803 Family history of malignant neoplasm of breast: Secondary | ICD-10-CM

## 2017-06-06 DIAGNOSIS — F419 Anxiety disorder, unspecified: Secondary | ICD-10-CM

## 2017-06-06 DIAGNOSIS — M1990 Unspecified osteoarthritis, unspecified site: Secondary | ICD-10-CM

## 2017-06-06 DIAGNOSIS — C50911 Malignant neoplasm of unspecified site of right female breast: Secondary | ICD-10-CM

## 2017-06-06 DIAGNOSIS — R634 Abnormal weight loss: Secondary | ICD-10-CM

## 2017-06-06 DIAGNOSIS — Z09 Encounter for follow-up examination after completed treatment for conditions other than malignant neoplasm: Secondary | ICD-10-CM

## 2017-06-06 DIAGNOSIS — R1013 Epigastric pain: Secondary | ICD-10-CM

## 2017-06-06 DIAGNOSIS — R9389 Abnormal findings on diagnostic imaging of other specified body structures: Secondary | ICD-10-CM

## 2017-06-06 MED ORDER — HEPARIN SODIUM LOCK FLUSH 100 UNIT/ML IV SOLN
500 [IU] | Status: DC | PRN
Start: 2017-06-06 — End: 2017-06-07

## 2017-06-06 MED ORDER — DISCONNECT MEDICATION INFUSION JX
1 | Freq: Once | Status: CN
Start: 2017-06-06 — End: ?

## 2017-06-06 MED ORDER — DEXAMETHASONE SODIUM PHOSPHATE 20 MG/5ML IJ SOLN
20 mg | INTRAVENOUS | Status: CN | PRN
Start: 2017-06-06 — End: ?

## 2017-06-06 MED ORDER — PALONOSETRON HCL 0.25 MG/5ML IV SOLN
.25 mg | Freq: Once | INTRAVENOUS | Status: CP
Start: 2017-06-06 — End: ?

## 2017-06-06 MED ORDER — DIPHENHYDRAMINE IVPB JX
25 mg | Freq: Once | INTRAVENOUS | Status: CN
Start: 2017-06-06 — End: ?

## 2017-06-06 MED ORDER — LORAZEPAM 2 MG/ML IJ SOLN
1 mg | Freq: Once | INTRAVENOUS | Status: CN
Start: 2017-06-06 — End: ?

## 2017-06-06 MED ORDER — LORAZEPAM 2 MG/ML IJ SOLN
.5 mg | Freq: Once | INTRAVENOUS | Status: CP
Start: 2017-06-06 — End: ?

## 2017-06-06 MED ORDER — SODIUM CHLORIDE FLUSH 0.9 % IV SOLN
10 mL | Status: CN | PRN
Start: 2017-06-06 — End: ?

## 2017-06-06 MED ORDER — DEXAMETHASONE IVPB BCN JX
10 mg | Freq: Once | INTRAVENOUS | Status: CN
Start: 2017-06-06 — End: ?

## 2017-06-06 MED ORDER — SODIUM CHLORIDE 0.9 % IV SOLN
Freq: Once | INTRAVENOUS | Status: CN
Start: 2017-06-06 — End: ?

## 2017-06-06 MED ORDER — ONDANSETRON IVPB BCN JX
8 mg | Freq: Once | INTRAVENOUS | Status: CN
Start: 2017-06-06 — End: ?

## 2017-06-06 MED ORDER — PROMETHAZINE IN NS IVPB JX
12.5 mg | Freq: Once | INTRAVENOUS | Status: CN
Start: 2017-06-06 — End: ?

## 2017-06-06 MED ORDER — DEXTROSE 5 % IV SOLN
Freq: Once | INTRAVENOUS | Status: CN
Start: 2017-06-06 — End: ?

## 2017-06-06 MED ORDER — PALONOSETRON HCL 0.25 MG/5ML IV SOLN
.25 mg | Freq: Once | INTRAVENOUS | Status: CN
Start: 2017-06-06 — End: ?

## 2017-06-06 MED ORDER — ONDANSETRON IVPB BCN JX
4 mg | Freq: Once | INTRAVENOUS | Status: CN
Start: 2017-06-06 — End: ?

## 2017-06-06 MED ORDER — LORAZEPAM 2 MG/ML IJ SOLN
.5 mg | Freq: Once | INTRAVENOUS | Status: CN
Start: 2017-06-06 — End: ?

## 2017-06-06 MED ORDER — FLUOROURACIL INFUSION AMB PUMP BCN JX
2400 mg/m2 | Freq: Once | INTRAVENOUS | Status: CN
Start: 2017-06-06 — End: ?

## 2017-06-06 MED ORDER — HYDROCORTISONE NA SUCCINATE PF 100 MG IJ SOLR
100 mg | INTRAVENOUS | Status: CN | PRN
Start: 2017-06-06 — End: ?

## 2017-06-06 MED ORDER — FOSAPREPITANT IVPB JX
150 mg | Freq: Once | INTRAVENOUS | Status: CN
Start: 2017-06-06 — End: ?

## 2017-06-06 MED ORDER — DIPHENHYDRAMINE HCL 50 MG/ML IJ SOLN
25 mg | INTRAVENOUS | Status: CN | PRN
Start: 2017-06-06 — End: ?

## 2017-06-06 MED ORDER — FLUOROURACIL IV SYRINGE BCN JX
400 mg/m2 | Freq: Once | INTRAVENOUS | Status: CN
Start: 2017-06-06 — End: ?

## 2017-06-06 MED ORDER — DEXAMETHASONE IVPB BCN JX
4 mg | Freq: Once | INTRAVENOUS | Status: CN
Start: 2017-06-06 — End: ?

## 2017-06-06 MED ORDER — CONNECT MEDICATION INFUSION JX
1 | Freq: Once | Status: CN
Start: 2017-06-06 — End: ?

## 2017-06-06 MED ORDER — HEPARIN SODIUM LOCK FLUSH 100 UNIT/ML IV SOLN
500 [IU] | Status: CN | PRN
Start: 2017-06-06 — End: ?

## 2017-06-06 MED ORDER — FAMOTIDINE IVPB JX
20 mg | Freq: Once | INTRAVENOUS | Status: CN
Start: 2017-06-06 — End: ?

## 2017-06-06 MED ORDER — MEPERIDINE HCL 25 MG/ML IJ SOLN
25 mg | INTRAVENOUS | Status: CN | PRN
Start: 2017-06-06 — End: ?

## 2017-06-06 MED ORDER — OXALIPLATIN INFUSION BCN JX
85 mg/m2 | Freq: Once | INTRAVENOUS | Status: CN
Start: 2017-06-06 — End: ?

## 2017-06-06 MED ORDER — SODIUM CHLORIDE 0.9 % IV SOLN
Freq: Once | INTRAVENOUS | Status: CP
Start: 2017-06-06 — End: ?

## 2017-06-06 MED ORDER — LEUCOVORIN IVPB JX
400 mg/m2 | Freq: Once | INTRAVENOUS | Status: CN
Start: 2017-06-06 — End: ?

## 2017-06-06 MED ORDER — DEXAMETHASONE IVPB BCN JX
4 mg | Freq: Once | INTRAVENOUS | Status: CP
Start: 2017-06-06 — End: ?

## 2017-06-06 MED ORDER — DISCONNECT MEDICATION INFUSION JX
1 | Freq: Once | Status: CP
Start: 2017-06-06 — End: ?

## 2017-06-06 NOTE — Patient Instructions
RTC in 4weeks with labs 1-2wk prior   Continue with chemo therapy

## 2017-06-07 NOTE — Progress Notes
shortness of breath. I left a message with Dr. Robbie Lis, oncologist from 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  Patient is here complains of increasing nausea and vomiting, not able to keep anything down., She is having obstructive symptoms., She had fluids yesterday as well. Denies any fever or chills or cough or chest pain feeling very tired and fatigued lost 2-3 pounds last few days, I have discussed with Dr. 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 Patient denies any fever or chills cough chest pain or shortness of breath. Eating only very minimal using the J-tube. 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

## 2017-06-07 NOTE — Progress Notes
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 The patient has a Mediport placed as of this Friday, 02/22/2017. I discussed today with her patient and her sister regarding her diagnosis, prognosis and treatments for this type of tumor. I explained in great detail the effects and side effects of chemotherapy especially neuropathy with oxaliplatin possible chest pain or discomfort with 5-FU, less of alopecia and no major other side effects. In general this tablet treatment is relatively well tolerated without excessive cytopenias. I prescribed antinausea medications in case of chemotherapy associated nausea and vomiting, to her local pharmacy. We did add toward with the patient and the sister of our chemotherapy unit and introduce her to our chemotherapy nurses. I have prescribed chemotherapy to start tentatively on Wednesday, Feb 27, 2017., Achieved complete remission by PET CT scan 04/25/2017    Given her heavy family history of breast cancer, colon cancer, prostate cancer, I have requested a genetic evaluation testing by myriad laboratories. At [facility] M[facility] [place] Foundation testing was requested and results need to be followed for molecular biology of the

## 2017-06-07 NOTE — Progress Notes
evaluated by medical oncologist at [facility] but she preferred to transfer her treatment to this facility and also to the [place] [facility] and she has seen Dr. Awad also as of yesterday, who recommended an MRI of the liver as well as neoadjuvant chemotherapy. I do not have results of her PET/CT scan but the patient told me verbally that this was negative for liver lesions, initially described at a recent CT scan of the upper and lower abdomen. There was no other distant metastasis but there was lymphadenopathy associated with the stomach cancer. The patient is scheduled to undergo a Mediport placement by Dr. Perkins as of Friday 02/22/2017. She was suggested to have FOLFOX chemotherapy as soon as possible.  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 Patient couldn't tolerate tamoxifen, which has been discontinued. Patient also BRCA 1 and 2 negative in the past  Patient is here for the first course of chemotherapy with FOLFOX. She has many questions about her disease process as well as the biopsy results and also MRI results. I have discussed this in length with the patient and felt that currently she is only as stage III disease by MRI. And as per Dr. Awad, surgeons recommendation I will proceed with FOLFOX chemotherapy at this time. Patient still has mild dysphagia but able to bring labs of protein supplements. Denies any fever or chills cough or chest pain or

## 2017-06-07 NOTE — Progress Notes
?   AST 05/01/2017 61*   ? ALT 05/01/2017 109*   Office Visit on 04/08/2017   Component Date Value   ? WHITE BLOOD CELL COUNT 04/11/2017 5.9    ? RBC 04/11/2017 3.64*   ? Hemoglobin 04/11/2017 11.3*   ? Hematocrit 04/11/2017 34.0*   ? MCV 04/11/2017 93.4    ? [facility] 04/11/2017 31.0    ? MCHC 04/11/2017 33.2    ? RDW 04/11/2017 12.4    ? Platelets 04/11/2017 266    ? MPV 04/11/2017 10.7    ? Neutrophils Absolute 04/11/2017 1894    ? Lymphocytes Absolute 04/11/2017 2631    ? Monocytes Absolute 04/11/2017 1245*   ? Eosinophils Absolute 04/11/2017 89    ? Basophils Absolute 04/11/2017 41    ? Neutrophils 04/11/2017 32.1    ? LYMPHOCYTES 04/11/2017 44.6    ? MONOCYTES 04/11/2017 21.1    ? EOSINOPHILS 04/11/2017 1.5    ? BASOPHILS 04/11/2017 0.7    ? COMMENT(S) 04/11/2017     ? Glucose 04/11/2017 96    ? Urea Nitrogen 04/11/2017 18    ? Creatinine 04/11/2017 0.81    ? EGFR 04/11/2017 80    ? Glom Filt Rate, Est Afri* 04/11/2017 93    ? BUN/Creatinine Ratio 62/22/9798 NOT APPLICABLE    ? Sodium 04/11/2017 141    ? Potassium 04/11/2017 4.0    ? Chloride 04/11/2017 102    ? CARBON DIOXIDE 04/11/2017 26    ? Calcium 04/11/2017 9.4    ? Protein, Total 04/11/2017 5.8*   ? ALBUMIN 04/11/2017 3.6    ? Globulin 04/11/2017 2.2    ? ALBUMIN/GLOBULIN RATIO 04/11/2017 1.6    ? Total Bilirubin 04/11/2017 0.3    ? Alkaline Phosphatase 04/11/2017 91    ? AST 04/11/2017 24    ? ALT 04/11/2017 40*   ? CEA 04/11/2017 2.0      02/19/2017 CBC, CMP grossly normal, CEA 1.5, LDH 124  03/19/2017 CBC grossly normal except hemoglobin 11.5 CMP within normal limits, reviewed with the patient today March 19, 2017  04/02/2017 CBC grossly normal except hemoglobin 11.0 CMP grossly normal, reviewed with the patient and her sister today April 08, 2017 ,   05/01/2017 CBC grossly normal except hemoglobin 10.7, platelet 10/30/1998, CMP grossly normal except albumin 3.3, AST 103, reviewed with the patient today May 06, 2017

## 2017-06-07 NOTE — Progress Notes
Chauncey Reading, MD  [address]  [place] [address]  [facility] [place] [address]    Chief Complaint   Patient presents with   ? Gastric ademocarcinoma   ? Follow - Up: Review Test Results   The patient has been referred to this clinic for workup and evaluation and treatment of adenocarcinoma of the GE junction.    Patient Name: Mariah Cameron      Date Of Birth: Feb 20, 1956    History of Present Illness:      Ms. 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 The patient started having severe acid reflux syndrome in November 2017 and she ended up having an endoscopy on 01/28/2017 with also endoscopic ultrasound on 02/08/2017. Results of the pathology by biopsy of a fungating mass were discussed as above. According to the patient's report the tumor was HER-2 negative. She was initially evaluated by medical oncologist at [facility] [place] but she preferred to transfer her treatment to this facility and also to the [place] and she has seen Dr. Freddrick March also as of yesterday, who recommended an MRI of the liver as well as neoadjuvant chemotherapy. I do not have results of her PET/CT scan but the patient told me verbally that this was negative for liver lesions, initially described at a recent CT scan of the upper and lower abdomen. There was no other distant metastasis but there was lymphadenopathy associated with the stomach cancer. The patient is scheduled to undergo a Mediport placement by Dr. Dara Lords as of Friday 02/22/2017. She was suggested to have FOLFOX chemotherapy as soon as possible.    Her past medical history significant for osteoporosis as well as

## 2017-06-07 NOTE — Progress Notes
remission by PET/CT scan. She also had a J-tube placed for feeding purposes as well.  Patient is here for follow-up as well as for chemotherapy. She completed 6 treatment with FOLFOX and tolerated very well except mild numbness of hands and feet lasting for few days. She is gaining weight continue to use J-tube and also able to eat regular food. Patient is been scheduled to have surgery and of October. Plan is to complete chemotherapy 4 weeks before the surgery. I have discussed the case with Dr. Genia Harold from [facility] who agree about giving total 8 courses of FOLFOX before surgery and then depending on pathology, we may consider postoperative chemotherapy if she has persistent tumor. No other new complaints except mild fatigue  Interim medical history, surgical history family history social history were reviewed no change from previous visit of August 2018      Past Surgical History:   Past Surgical History:   Procedure Laterality Date   ? BREAST RECONSTRUCTION  2016   ? CATARACT EXTRACTION W/ INTRAOCULAR LENS IMPLANT     ? COLONOSCOPY W/ BIOPSY  01/28/2017   ? EGD W/ BIOPSY  01/28/2017   ? EUS UPPER W/ FNA  02/08/2017   ? HYSTERECTOMY  2003    uterine fibroids   ? LASIK     ? MASTECTOMY  2011   Diagnostic laparoscopy and laparoscopic feeding jejunostomy tube placement 03/13/2017    Social History:    Social History     Social History   ? Marital status: Single     Spouse name: N/A   ? Number of children: 0   ? Years of education: N/A     Occupational History   ? Not on file.     Social History Main Topics   ? Smoking status: Former Smoker     Packs/day: 1.00     Years: 15.00     Types: Cigarettes     Start date: 06/24/1974     Quit date: 11/29/1988   ? Smokeless tobacco: Never Used   ? Alcohol use Yes   ? Drug use: Yes     Frequency: 7.0 times per week     Types: Marijuana   ? Sexual activity: Not on file     Other Topics Concern   ? Not on file     Social History Narrative   ? No narrative on file

## 2017-06-07 NOTE — Progress Notes
Family History:    Family History   Problem Relation Age of Onset   ? Breast Cancer Mother    ? Prostate Cancer Father    ? Colon Cancer Father        Vital Signs:   Vitals:    06/06/17 1426   BP: 103/67   Pulse: 72   Temp: 36.4 ?C (97.6 ?F)   Weight: 62.6 kg (137 lb 14.4 oz)   Height: 1.702 m (5\' 7" )     Vitals:    06/06/17 1426   BP: 103/67   Pulse: 72   Temp: 36.4 ?C (97.6 ?F)   Weight: 62.6 kg (137 lb 14.4 oz)   Height: 1.702 m (5\' 7" )       Allergies: Patient has no known allergies.    Review of Systems:    Constitutional:No more weight loss, mild fatigue, No fever, able to swallow real food  Eyes: No double vision  Ears,Nose, Mouth,Throat: No sinus trouble, No sore throat  Cardiac: No lightheadedness, No swelling in legs, No chest pain  Respiratory: No cough, No hemoptysis, No shortness of breath  Gastrointestinal:  Mild nausea, no vomiting No diarrhea, No abdominal pain. Mild dysphagia, jejunostomy tube in place  Genitourinary: No burning on urination  Musculosketal: No bone pain.   Skin: No skin rash  Neurological: No seizures, No loss of balance, No weakness of limbs, mild numbness of both feet, occasionally hands  Psychiatric: Deferred  Hematologic/Lymphatic/Immunologic: No bleeding, No lumps in arm pits  Sept, 6, 2018, 12 system review done as above    Medications:   Current Outpatient Prescriptions:   ?  ALPRAZolam (XANAX) 0.5 MG PO Tablet, TK 1 T PO QHS, Disp: , Rfl: 2  ?  ARMOUR THYROID 15 MG PO Tablet, TK 3 TS PO ONCE D OES, Disp: , Rfl: 3  ?  CARAFATE 1 GM/10ML PO Suspension, TK 10 ML PO TID OES 1 HOUR B MEALS AND HS, Disp: , Rfl: 2  ?  diclofenac (VOLTAREN GEL) 1 % TD Gel, APP AA BID UTD, Disp: , Rfl: 0  ?  eszopiclone (LUNESTA) 3 MG PO Tablet, , Disp: , Rfl: 2  ?  HYDROcodone-acetaminophen (NORCO) 10-325 MG PO Tablet, TK 1 T PO Q 6 H PRN, Disp: , Rfl: 0  ?  HYDROcodone-acetaminophen (NORCO) 5-325 MG PO Tablet, TK 1-2 TS PO Q 6 H, Disp: , Rfl: 0

## 2017-06-07 NOTE — Progress Notes
tumor to include MSI status or other clinically relevant molecular markers.    The patient will return to this clinic for follow-up, most probable with Dr. Lurena Joiner, in 2 weeks after her first round of chemotherapy to assess for toxicity and possible side effects.    The patient and her sister had a long list of questions regarding diagnosis, treatment, sequencing of multi modality treatments, side effects of chemotherapy and this questions were answered to the best of my knowledge during this initial evaluation.    It is of note is that the knee CT scan of the chest abdomen and pelvis from Feb 05, 2017 to indeterminate subcentimeter low-density lesions were noted within the lateral segment of the left hepatic lobe. These were biopsied by fine-needle aspiration at [facility] and were negative for malignancy. Most recent PET CT scan at Dr. Dario Guardian facility did not confirm liver metastasis.    With today's evaluation also I have requested baseline laboratory testing to include a CBC, CMP, LDH, CEA.  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 If she does have a good response she'll be a candidate for curative surgery. Also extensively discussed with him about the toxicities of chemo therapy which may include mild nausea vomiting alopecia myelosuppression, oxaliplatin-induced neuropathy, excessive sensitivity to cold temperature as well as occasional mucositis from 5-fluorouracil. Patient and her sister voiced her good understanding and are anxious to proceed with chemo therapy. I plan to see her back again in 10 days CBC CMP for follow-up  I also left the message with Dr. Robbie Lis to see whether he has sent specimen for Foundation molecular analysis if not I will send to [facility] for the

## 2017-06-07 NOTE — Progress Notes
suggested to give him 12 treatments before the surgery but when I talked to Dr. Benjie Karvonen today  his recommendation remain the same to have the surgery after the 8 treatment and following surgery if feasible will give 4 more course of chemo therapy. I plan to see her back again in 2 weeks for follow-up as preparation request. I spent more than 35 minutes including 35 minutes counseling time answered all her questions to her satisfaction. I also discuss with Dr.Mody from [facility] as well  June 06, 2017  As per my previous discussion with the patient and Dr. Genia Harold from [facility] it was decided to complete 8 treatment with FOLFOX which will complete in October 4 and surgery has been scheduled end of October and [facility] as per patient request. Patient already seen the surgeon and surgery schedule has been made. I will continue with chemo therapy every 2 weeks and as I mentioned it will be finished by October 4. I advised her to take a multivitamin with iron supplement once daily to improve the anemia. Encouraged to eat better and gain stream or weight before the surgery after the surgery she might lose a few pounds. I plan to see her back again 4 weeks with CBC CMP and LDH for follow-up. Patient also scheduled PET/CT scan in couple weeks. I spent more than 35 minutes including 25 minutes counseling time and answered all her questions to her satisfaction  For rest of her medical problem and continue with current management and follow with her primary physician. I have encouraged her to call me directly if she has any questions or if I can be of any further assistance. Results of most recent studies discussed with patient. All questions were answered to patient's satisfaction. Patient agreeable to the above plan. More than 50% of that time was spent on coordination of care, counseling, further diagnostic and therapeutic clinic and follow-up care. This document was created using voice recognition software.

## 2017-06-07 NOTE — Progress Notes
06/04/2017, CBC grossly normal except hemoglobin 10.1, CMP not available, B12 2000, reviewed with the patient today June 06, 2017    5/4/2018A. 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 SEE COMMENT    B. ANTRUM (ENDOSCOPIC BIOPSY):   -  MILD CHRONIC GASTRITIS WITH MINIMAL ACTIVITY.   -  NO HELICOBACTER PYLORI IDENTIFIED BY MORPHOLOGICAL EVALUATION.   -  NO INTESTINAL METAPLASIA, DYSPLASIA OR MALIGNANCY IDENTIFIED.    C. POLYP DESCENDING (ENDOSCOPIC BIOPSY):   -  TUBULAR ADENOMA   -  NEGATIVE FOR HIGH-GRADE DYSPLASIA AND MALIGNANCY  HER2 Non-Breast: NEGATIVE  Score: 1+    02/08/2017. LIVER (FINE NEEDLE ASPIRATION):   -  NO EVIDENCE OF MALIGNANCY IDENTIFIED.    -  SMALL DETACHED FRAGMENTS OF LIVER PARENCHYMA AND SUPERFICIAL GASTRIC        FOVEOLAR EPITHELIUM.    Imaging Results:     02/14/2017, PET/CT scan showed FDG uptake in the anterior wall of the antrum consistent with malignancy, 1.87 with a lymph node within the stomach and liver, no obvious liver metastasis  02/21/2017, MRI of the abdomen there is 4-5 mm nonenhancing lesion noted within the left lobe of the liver which were present and atypical cyst or small hemangioma no obvious additional lesions are seen within the liver, there is a mass within the stomach consistent with patient's history of stomach cancer  I have reviewed these reports with the patient today March 04, 2017  EUS, staged as T3 N3, M1 although biopsy of the liver negative for malignancy as his MRI and the PET scan  04/25/2017 PET CT scan showed no evidence of residual or metastatic gastric cancer, no evidence of other malignancy, reviewed with the patient today 05/06/2017    Physical Exam:   Physical examination  Patient is alert, not in any acute distress , tired and fatigue, PS-0  HEENT, no pallor nor icterus, throat clear is a petechia  Neck, no lymphadenopathy, no thyromegaly, no lymphadenopathy

## 2017-06-07 NOTE — Progress Notes
care. This document was created using voice recognition software. Inadvertent typographical errors, word omissions and/or word substitutions may exist.         Return to Clinic:  in 4 weeks.  Dr. Rosaura Carpenter,  Dr. Carmelina Paddock, Dr. Freddrick March  .

## 2017-06-07 NOTE — Progress Notes
weeks break before undergoing surgery. Patient tells me that Dr. Benjie Karvonen suggested to give him 12 treatments before the surgery but when I talked to Dr. Benjie Karvonen today  his recommendation remain the same to have the surgery after the 8 treatment and following surgery if feasible will give 4 more course of chemo therapy. I plan to see her back again in 2 weeks for follow-up as preparation request. I spent more than 35 minutes including 35 minutes counseling time answered all her questions to her satisfaction. I also discuss with Dr.Mody from [facility] as well  June 06, 2017  As per my previous discussion with the patient and Dr. Genia Harold from [facility] it was decided to complete 8 treatment with FOLFOX which will complete in October 4 and surgery has been scheduled end of October and [facility] as per patient request. Patient already seen the surgeon and surgery schedule has been made. I will continue with chemo therapy every 2 weeks and as I mentioned it will be finished by October 4. I advised her to take a multivitamin with iron supplement once daily to improve the anemia. Encouraged to eat better and gain stream or weight before the surgery after the surgery she might lose a few pounds. I plan to see her back again 4 weeks with CBC CMP and LDH for follow-up. Patient also scheduled PET/CT scan in couple weeks. I spent more than 35 minutes including 25 minutes counseling time and answered all her questions to her satisfaction  For rest of her medical problem and continue with current management and follow with her primary physician. I have encouraged her to call me directly if she has any questions or if I can be of any further assistance. Results of most recent studies discussed with patient. All questions were answered to patient's satisfaction. Patient agreeable to the above plan. More than 50% of that time was spent on coordination of care, counseling, further diagnostic and therapeutic clinic and follow-up

## 2017-06-07 NOTE — Progress Notes
week she will receive the day 15 of course 2 of chemotherapy with FOLFOX. . Advised to use the jejunostomy tube frequently also flush it with water to prevent dehydration . The patient wants to she will try to drink soups as well. . I plan to see her back again in 2-3 weeks for follow-up with CBC and CMP. After 6 treatments I plan to repeat a PET CT scan for follow-up I spent more than 40 minutes including 30 minutes counseling time and answered all their questions to their satisfaction     May 06, 2017  I am very happy to see that PET/CT scan showed complete remission without any evidence of malignancy at this time. I will continue with chemo therapy preferably total of 8 treatment before undergoing surgery. After surgery will be difficult to provide adjuvant chemotherapy due to poor tolerance. But I will wait for her visit with [facility] and see what the surgeons offered there, if they want to  do the surgery after the 6 treatment, as planned before that will be okay too, then I would consider providing further adjuvant chemotherapy after the surgery even though difficult to administer due to poor tolerance. I plan to see her back again in 4 weeks with CBC CMP CEA for follow-up and also for further discussion and hopefully, she make up her mind about who should be the surgeon. I reviewed the labs and elected to continue with chemo therapy.  I spent more than 40 minutes including 30 minutes counseling time and answered all her questions to her satisfaction  May 23, 2017  Patient had good experience in [facility] and able to see oncologist Dr. Benjie Karvonen and surgeon. Patient decided to have her surgery done in [facility] which will be end of October. As preparation request I am holding one treatment this week and she will back again chemo therapy after holiday. Plan remain the same to give a course of chemotherapy and will have 4 weeks break before undergoing surgery. Patient tells me that Dr. Benjie Karvonen

## 2017-06-07 NOTE — Progress Notes
metastasis to the liver,Thus patient is pain staged as stage III, T3 N3 by EUS, HER-2/neu negative patient also had a J-tube placed for feeding purposes due to persistent nausea and vomiting,  Patient is here for follow-up, she has completed 2 courses of chemotherapy and tolerated it reasonably well although continued to have nausea. She has been using jejunostomy tube and tolerating with some eval. Although she is having some dehydration and Gave extra fluid last week. 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 Patient is been able to swallow better although continued to use jejunostomy tube for feeding. She has gained weight, feeling much better although continued Fatigue and tiredness. She has been seen at [facility] , but she would like to get second opinion from [facility] which will be next week. Patient has not decided who she wants to have surgery done although I strongly proposed that my experience with Dr. Freddrick March is remarkable that Dr. Freddrick March does minimal invasive surgery extremely well and has more experience than anybody in town. She would

## 2017-06-07 NOTE — Progress Notes
?    lidocaine-prilocaine (EMLA) 2.5-2.5 % EX Cream, Apply 30 minutes prior to treatment, Disp: 60 g, Rfl: 1  ?  omeprazole (PriLOSEC) 40 MG PO Capsule Delayed Release, TK 1 C PO QD AC, Disp: , Rfl: 5  ?  ondansetron (ZOFRAN-ODT) 8 MG PO Tablet Disintegrating, Dissolve 1 tablet in mouth every 8 hours as needed for nausea., Disp: 30 tablet, Rfl: 11  ?  prochlorperazine (COMPAZINE) 10 MG PO Tablet, Take 1 tablet by mouth every 6 hours as needed., Disp: 60 tablet, Rfl: 11  ?  QUEtiapine (SEROquel) 50 MG PO Tablet, , Disp: , Rfl: 0  ?  amoxicillin-clavulanate (AUGMENTIN) 400-57 MG PO Tablet Chewable, Chew 1 tablet 2 times daily for 1 day., Disp: 2 tablet, Rfl: 0  ?  ibuprofen (ADVIL,MOTRIN) 600 MG PO Tablet, Take 1 tablet by mouth 3 times daily for 5 days., Disp: 15 tablet, Rfl: 0  No current facility-administered medications for this visit.     Facility-Administered Medications Ordered in Other Visits:   ?  heparin 100 UNIT/ML flush 500 Units, 500 Units, Intercatheter, PRN, Rogene Houston, MD, 500 Units at 06/06/17 1408      Labs:   Hospital Outpatient Visit on 06/04/2017   Component Date Value   ? WBC 06/04/2017 6.77    ? RBC 06/04/2017 3.34    ? Hemoglobin 06/04/2017 10.1*   ? Hematocrit 06/04/2017 31.1*   ? MCV 06/04/2017 93.1    ? [place] 06/04/2017 30.2    ? MCHC 06/04/2017 32.5    ? RDW 06/04/2017 13.1    ? Platelet Count 06/04/2017 206    ? MPV 06/04/2017 10.3    ? Neutrophils Absolute 06/04/2017 3.43    ? Neutrophils % 06/04/2017 50.7    ? Lymphocytes Absolute 06/04/2017 2.42    ? Lymphocytes % 06/04/2017 35.7    ? Monocytes Absolute 06/04/2017 0.70    ? Monocytes % 06/04/2017 10.3    ? Eosinophils Absolute 06/04/2017 0.18    ? Eosinophils % 06/04/2017 2.7    ? Basophil Absolute 06/04/2017 0.04    ? BASOPHILS 06/04/2017 0.6    Orders Only on 06/04/2017   Component Date Value   ? Vitamin B-12 06/04/2017 >2000*   Office Visit on 05/23/2017   Component Date Value   ? Glucose 06/04/2017 105*

## 2017-06-07 NOTE — Progress Notes
for [employer] molecular analysis if not I will send to [facility] for the same analysis  I spent more than 40 minutes including 30 minutes counseling time and answered all her questions to their satisfaction  March 07, 2017  This patient has persistent nausea vomiting and also has some obstructive symptoms from GE junction tumor, I have discussed the case with Dr. Freddrick March was suggested to have a jejunostomy tube placed for nutritional purposes. Dr. Freddrick March also talked to the patient to phone regarding the need of jejunostomy tube. Patient understand the situation and she is willing to proceed with the jejunostomy tube. Dr. Vilinda Blanks will make appropriate arrangements. I will give IV fluid today and tomorrow and advised to drink protein drinks as much she can. I also twice her that in today to have nausea vomiting she may need to go to the emergency room in May needs to be admitted to the hospital for jejunostomy tube and IV fluids. I plan to see her back again next week as planned before, I spent more than 35 minutes including 25 minutes counseling time and also discuss with Dr. Freddrick March    March 19, 2017  I'm happy to see that patient tolerated laparoscopic jejunostomy tube placement and currently tolerating the tube feeding very well. I have reviewed the labs and will proceed with second course of chemotherapy with FOLFOX at this time. I also used emend to prevent nausea so so with chemo therapy. I'm happy to see that patient has no involvement of the omentum or the peritoneum. I still believe she'll be a candidate for curative surgery if she has lost want to chemotherapy well. I will continue FOLFOX chemotherapy every 2 weeks and see her back again in 4 weeks with CBC CMP for follow-up. I spent more than 40 minutes including 30 minutes counseling time and answered all their questions to their satisfaction also reviewed the recent hospitalization for surgery as well    April 09, 2017

## 2017-06-07 NOTE — Progress Notes
same analysis  I spent more than 40 minutes including 30 minutes counseling time and answered all her questions to their satisfaction  March 07, 2017  This patient has persistent nausea vomiting and also has some obstructive symptoms from GE junction tumor, I have discussed the case with Dr. Freddrick March was suggested to have a jejunostomy tube placed for nutritional purposes. Dr. Freddrick March also talked to the patient to phone regarding the need of jejunostomy tube. Patient understand the situation and she is willing to proceed with the jejunostomy tube. Dr. Vilinda Blanks will make appropriate arrangements. I will give IV fluid today and tomorrow and advised to drink protein drinks as much she can. I also twice her that in today to have nausea vomiting she may need to go to the emergency room in May needs to be admitted to the hospital for jejunostomy tube and IV fluids. I plan to see her back again next week as planned before, I spent more than 35 minutes including 25 minutes counseling time and also discuss with Dr. Freddrick March    March 19, 2017  I'm happy to see that patient tolerated laparoscopic jejunostomy tube placement and currently tolerating the tube feeding very well. I have reviewed the labs and will proceed with second course of chemotherapy with FOLFOX at this time. I also used emend to prevent nausea so so with chemo therapy. I'm happy to see that patient has no involvement of the omentum or the peritoneum. I still believe she'll be a candidate for curative surgery if she has lost want to chemotherapy well. I will continue FOLFOX chemotherapy every 2 weeks and see her back again in 4 weeks with CBC CMP for follow-up. I spent more than 40 minutes including 30 minutes counseling time and answered all their questions to their satisfaction also reviewed the recent hospitalization for surgery as well    April 09, 2017   Labs were reviewed today and elected to continue with chemotherapy next

## 2017-06-07 NOTE — Progress Notes
Respiratory system, bilateral mild wheezing no rhonchi or vascular  Cardiac, S1-S2 regular  Abdomen, soft nontender no hepatosplenomegaly, bowel sounds are active, no other mass palpable, the jejunostomy tube in place and functioning well  Musculoskeletal, no joint pain, no muscle pain, mild numbness of the feet and hands  CNS, alert oriented ?3, able to more 4 extremities, mild peripheral neuropathy  No peripheral edema both feet  Psychiatric, not agitated or anxious  June 06, 2017,Detailed physical examination performed as above      Assessment:    [name] with 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 The patient has a Mediport placed as of this Friday, 02/22/2017. I discussed today with her patient and her sister regarding her diagnosis, prognosis and treatments for this type of tumor. I explained in great detail the effects and side effects of chemotherapy especially neuropathy with oxaliplatin possible chest pain or discomfort with 5-FU, less of alopecia and no major other side effects. In general this tablet treatment is relatively well tolerated without excessive cytopenias. I prescribed antinausea medications in case of chemotherapy associated nausea and vomiting, to her local pharmacy. We did add toward with the patient and the sister of our chemotherapy unit and introduce her to our chemotherapy nurses. I have prescribed chemotherapy to start tentatively on Wednesday, Feb 27, 2017., Achieved complete remission by PET CT scan 04/25/2017    Given her heavy family history of breast cancer, colon cancer, prostate cancer, I have requested a genetic evaluation testing by myriad laboratories. At [facility] [place] testing was

## 2017-06-07 NOTE — Progress Notes
requested and results need to be followed for molecular biology of the tumor to include MSI status or other clinically relevant molecular markers.    The patient will return to this clinic for follow-up, most probable with Dr. Lurena Joiner, in 2 weeks after her first round of chemotherapy to assess for toxicity and possible side effects.    The patient and her sister had a long list of questions regarding diagnosis, treatment, sequencing of multi modality treatments, side effects of chemotherapy and this questions were answered to the best of my knowledge during this initial evaluation.    It is of note is that the knee CT scan of the chest abdomen and pelvis from Feb 05, 2017 to indeterminate subcentimeter low-density lesions were noted within the lateral segment of the left hepatic lobe. These were biopsied by fine-needle aspiration at [facility] and were negative for malignancy. Most recent PET CT scan at Dr. Dario Guardian facility did not confirm liver metastasis.    With today's evaluation also I have requested baseline laboratory testing to include a CBC, CMP, LDH, CEA.  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 If she does have a good response she'll be a candidate for curative surgery. Also extensively discussed with him about the toxicities of chemo therapy which may include mild nausea vomiting alopecia myelosuppression, oxaliplatin-induced neuropathy, excessive sensitivity to cold temperature as well as occasional mucositis from 5-fluorouracil. Patient and her sister voiced her good understanding and are anxious to proceed with chemo therapy. I plan to see her back again in 10 days CBC CMP for follow-up  I also left the message with Dr. Robbie Lis to see whether he has sent specimen

## 2017-06-07 NOTE — Progress Notes
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 She had a wellness check in March 2018 and this was completely negative. The patient also has history of breast cancer in 2010 and she is status post right mastectomy with breast reconstruction. She did not receive chemotherapy but she was treated with Herceptin for 50 months by an oncologist in [place]. According to her report she could not tolerate tamoxifen. In 2016 she had an episode of vertigo and CT scan of the head showed dural thickness.    The patient has never noticed blood in the stool or in the urine, she never had the yellow jaundice, she never had hepatitis or blood transfusions. She has lost approximately 20 pounds since December 2017. She has difficulties with nutrition especially with solid food, as she has to cut the food in small pieces and chew for long time and she always gets pain postprandially.    The patient used to be a smoker for 10 years but she quit in 1989 and she smoked approximately one pack per day. She used to drink wine.    Regarding her family history her father has dementia and heart disease. Her mother had breast cancer at the age of 20 her great-grandmother also had breast cancer. There is no ovarian cancer in the family. Her father has history of prostate cancer and colon cancer. Her paternal grandmother had also colon cancer.    03/04/2017    Ms. 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 The patient started having severe acid reflux syndrome in November 2017 and she ended up having an endoscopy on 01/28/2017 with also endoscopic ultrasound on 02/08/2017. Results of the pathology by biopsy of a fungating mass were discussed as above. According to the patient's report the tumor was HER-2 negative. She was initially

## 2017-06-07 NOTE — Progress Notes
Inadvertent typographical errors, word omissions and/or word substitutions may exist.         Return to Clinic:  in 4 weeks.  Dr. Rosaura Carpenter,  Dr. Carmelina Paddock, Dr. Freddrick March  .

## 2017-06-07 NOTE — Progress Notes
Family History:    Family History   Problem Relation Age of Onset   ? Breast Cancer Mother    ? Prostate Cancer Father    ? Colon Cancer Father        Vital Signs:   Vitals:    06/06/17 1426   BP: 103/67   Pulse: 72   Temp: 36.4 ?C (97.6 ?F)   Weight: 62.6 kg (137 lb 14.4 oz)   Height: 1.702 m (5\' 7" )     Vitals:    06/06/17 1426   BP: 103/67   Pulse: 72   Temp: 36.4 ?C (97.6 ?F)   Weight: 62.6 kg (137 lb 14.4 oz)   Height: 1.702 m (5\' 7" )       Allergies: Patient has no known allergies.    Review of Systems:    Constitutional:No more weight loss, mild fatigue, No fever, able to swallow real food  Eyes: No double vision  Ears,Nose, Mouth,Throat: No sinus trouble, No sore throat  Cardiac: No lightheadedness, No swelling in legs, No chest pain  Respiratory: No cough, No hemoptysis, No shortness of breath  Gastrointestinal:  Mild nausea, no vomiting No diarrhea, No abdominal pain. Mild dysphagia, jejunostomy tube in place  Genitourinary: No burning on urination  Musculosketal: No bone pain.   Skin: No skin rash  Neurological: No seizures, No loss of balance, No weakness of limbs, mild numbness of both feet, occasionally hands  Psychiatric: Deferred  Hematologic/Lymphatic/Immunologic: No bleeding, No lumps in arm pits  May 23, 2017, 12 system review done as above    Medications:   Current Outpatient Prescriptions:   ?  ALPRAZolam (XANAX) 0.5 MG PO Tablet, TK 1 T PO QHS, Disp: , Rfl: 2  ?  ARMOUR THYROID 15 MG PO Tablet, TK 3 TS PO ONCE D OES, Disp: , Rfl: 3  ?  CARAFATE 1 GM/10ML PO Suspension, TK 10 ML PO TID OES 1 HOUR B MEALS AND HS, Disp: , Rfl: 2  ?  diclofenac (VOLTAREN GEL) 1 % TD Gel, APP AA BID UTD, Disp: , Rfl: 0  ?  eszopiclone (LUNESTA) 3 MG PO Tablet, , Disp: , Rfl: 2  ?  HYDROcodone-acetaminophen (NORCO) 10-325 MG PO Tablet, TK 1 T PO Q 6 H PRN, Disp: , Rfl: 0  ?  HYDROcodone-acetaminophen (NORCO) 5-325 MG PO Tablet, TK 1-2 TS PO Q 6 H, Disp: , Rfl: 0

## 2017-06-07 NOTE — Progress Notes
?   Urea Nitrogen 06/04/2017 11    ? Creatinine 06/04/2017 0.61    ? EGFR 06/04/2017 100    ? Glom Filt Rate, Est Afri* 06/04/2017 116    ? BUN/Creatinine Ratio 30/16/0109 NOT APPLICABLE    ? Sodium 06/04/2017 144    ? Potassium 06/04/2017 4.2    ? Chloride 06/04/2017 109    ? CARBON DIOXIDE 06/04/2017 27    ? Calcium 06/04/2017 9.3    ? Protein, Total 06/04/2017 5.7*   ? ALBUMIN 06/04/2017 3.6    ? Globulin 06/04/2017 2.1    ? ALBUMIN/GLOBULIN RATIO 06/04/2017 1.7    ? Total Bilirubin 06/04/2017 0.4    ? Alkaline Phosphatase 06/04/2017 119    ? AST 06/04/2017 28    ? ALT 06/04/2017 [facility] Outpatient Visit on 05/01/2017   Component Date Value   ? WBC 05/01/2017 5.79    ? RBC 05/01/2017 3.42    ? Hemoglobin 05/01/2017 10.7*   ? Hematocrit 05/01/2017 32.0*   ? MCV 05/01/2017 93.6    ? [place] 05/01/2017 31.3    ? MCHC 05/01/2017 33.4    ? RDW 05/01/2017 13.7    ? Platelet Count 05/01/2017 132*   ? MPV 05/01/2017 10.8    ? Neutrophils Absolute 05/01/2017 2.60    ? Neutrophils % 05/01/2017 44.9    ? Lymphocytes Absolute 05/01/2017 2.14    ? Lymphocytes % 05/01/2017 37.0    ? Monocytes Absolute 05/01/2017 0.84    ? Monocytes % 05/01/2017 14.5    ? Eosinophils Absolute 05/01/2017 0.17    ? Eosinophils % 05/01/2017 2.9    ? Basophil Absolute 05/01/2017 0.04    ? BASOPHILS 05/01/2017 0.7    [employer] Orders on 04/15/2017   Component Date Value   ? Glucose 05/01/2017 97    ? Urea Nitrogen 05/01/2017 15    ? Creatinine 05/01/2017 0.58    ? EGFR 05/01/2017 102    ? Glom Filt Rate, Est Afri* 05/01/2017 118    ? BUN/Creatinine Ratio 32/35/5732 NOT APPLICABLE    ? Sodium 05/01/2017 140    ? Potassium 05/01/2017 4.3    ? Chloride 05/01/2017 106    ? CARBON DIOXIDE 05/01/2017 29    ? Calcium 05/01/2017 8.9    ? Protein, Total 05/01/2017 5.2*   ? ALBUMIN 05/01/2017 3.3*   ? Globulin 05/01/2017 1.9    ? ALBUMIN/GLOBULIN RATIO 05/01/2017 1.7    ? Total Bilirubin 05/01/2017 0.4    ? Alkaline Phosphatase 05/01/2017 97

## 2017-06-07 NOTE — Progress Notes
like to get an opinion from [facility] which she already arranged for next week. Since patient had such a good response I recommend to continue for a total of 8 treatment before undergoing surgery since after surgery it would be difficult to provide adjuvant chemo therapy.   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 She also had a J-tube placed for feeding purposes as well.  Patient's here for a follow-up regarding her cancer as well as chemo therapy. She was seen in [facility] by surgeon as well as oncologists Dr. Benjie Karvonen who agreed with current plan of care including completion of 8 treatment with FOLFOX followed by surgery. Patient would like to have surgery done in [facility] and has been scheduled for end of October. Patient is been feeling much better now able to swallow and keeping it down continues to use J-tube for nutritional purposes. She has been feeling a bit tired and fatigued but much better than before start having some numbness and tingling in hands and feet. 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

## 2017-06-07 NOTE — Progress Notes
Labs were reviewed today and elected to continue with chemotherapy next week she will receive the day 15 of course 2 of chemotherapy with FOLFOX. . Advised to use the jejunostomy tube frequently also flush it with water to prevent dehydration . The patient wants to she will try to drink soups as well. . I plan to see her back again in 2-3 weeks for follow-up with CBC and CMP. After 6 treatments I plan to repeat a PET CT scan for follow-up I spent more than 40 minutes including 30 minutes counseling time and answered all their questions to their satisfaction     May 06, 2017  I am very happy to see that PET/CT scan showed complete remission without any evidence of malignancy at this time. I will continue with chemo therapy preferably total of 8 treatment before undergoing surgery. After surgery will be difficult to provide adjuvant chemotherapy due to poor tolerance. But I will wait for her visit with [facility] and see what the surgeons offered there, if they want to  do the surgery after the 6 treatment, as planned before that will be okay too, then I would consider providing further adjuvant chemotherapy after the surgery even though difficult to administer due to poor tolerance. I plan to see her back again in 4 weeks with CBC CMP CEA for follow-up and also for further discussion and hopefully, she make up her mind about who should be the surgeon. I reviewed the labs and elected to continue with chemo therapy.  I spent more than 40 minutes including 30 minutes counseling time and answered all her questions to her satisfaction  May 23, 2017  Patient had good experience in [facility] and able to see oncologist Dr. Benjie Karvonen and surgeon. Patient decided to have her surgery done in [facility] which will be end of October. As preparation request I am holding one treatment this week and she will back again chemo therapy after holiday. Plan remain the same to give a course of chemotherapy and will have 4

## 2017-06-11 ENCOUNTER — Ambulatory Visit: Primary: Internal Medicine

## 2017-06-12 ENCOUNTER — Encounter: Primary: Internal Medicine

## 2017-06-13 ENCOUNTER — Ambulatory Visit: Primary: Internal Medicine

## 2017-06-14 ENCOUNTER — Encounter: Primary: Internal Medicine

## 2017-06-18 ENCOUNTER — Inpatient Hospital Stay: Admit: 2017-06-18 | Discharge: 2017-06-19 | Primary: Internal Medicine

## 2017-06-18 DIAGNOSIS — C169 Malignant neoplasm of stomach, unspecified: Secondary | ICD-10-CM

## 2017-06-18 DIAGNOSIS — C50911 Malignant neoplasm of unspecified site of right female breast: Principal | ICD-10-CM

## 2017-06-18 MED ORDER — LEUCOVORIN IVPB JX
400 mg/m2 | Freq: Once | INTRAVENOUS | Status: CP
Start: 2017-06-18 — End: ?

## 2017-06-18 MED ORDER — LORAZEPAM 2 MG/ML IJ SOLN
1 mg | Freq: Once | INTRAVENOUS | Status: CP
Start: 2017-06-18 — End: ?

## 2017-06-18 MED ORDER — FLUOROURACIL INFUSION AMB PUMP BCN JX
2400 mg/m2 | Freq: Once | INTRAVENOUS | Status: DC
Start: 2017-06-18 — End: 2017-06-19

## 2017-06-18 MED ORDER — FAMOTIDINE 10 MG/ML IV SOLN CUSTOM COMPONENT
20 mg | Freq: Once | INTRAVENOUS | Status: CP
Start: 2017-06-18 — End: ?

## 2017-06-18 MED ORDER — OXALIPLATIN INFUSION BCN JX
85 mg/m2 | Freq: Once | INTRAVENOUS | Status: DC
Start: 2017-06-18 — End: 2017-06-19

## 2017-06-18 MED ORDER — FLUOROURACIL IV SYRINGE BCN JX
400 mg/m2 | Freq: Once | INTRAVENOUS | Status: DC
Start: 2017-06-18 — End: 2017-06-19

## 2017-06-18 MED ORDER — ONDANSETRON WITH DEXAMETHASONE IVPB (NON-SPECIFIED)
Freq: Once | INTRAVENOUS | Status: CP
Start: 2017-06-18 — End: ?

## 2017-06-18 MED ORDER — DEXTROSE 5 % IV SOLN
Freq: Once | INTRAVENOUS | Status: CP
Start: 2017-06-18 — End: ?

## 2017-06-18 MED ORDER — FOSAPREPITANT IVPB JX
150 mg | Freq: Once | INTRAVENOUS | Status: CP
Start: 2017-06-18 — End: ?

## 2017-06-18 NOTE — Progress Notes
Pt in chemo unit receiving oxliplatin/LV.  C/O heartburn in right  Upper chest.  Oxlipatin stopped.  Dr Lurena Joiner informed.  V/O to give Pepsid 20 mg IVP. S/S resolved infusion restarted at 1500.

## 2017-06-20 ENCOUNTER — Inpatient Hospital Stay: Admit: 2017-06-20 | Discharge: 2017-06-21 | Primary: Internal Medicine

## 2017-06-20 ENCOUNTER — Inpatient Hospital Stay: Primary: Internal Medicine

## 2017-06-20 DIAGNOSIS — C160 Malignant neoplasm of cardia: Principal | ICD-10-CM

## 2017-06-20 DIAGNOSIS — Z79899 Other long term (current) drug therapy: Secondary | ICD-10-CM

## 2017-06-20 DIAGNOSIS — K219 Gastro-esophageal reflux disease without esophagitis: Secondary | ICD-10-CM

## 2017-06-20 DIAGNOSIS — R634 Abnormal weight loss: Secondary | ICD-10-CM

## 2017-06-20 DIAGNOSIS — C169 Malignant neoplasm of stomach, unspecified: Secondary | ICD-10-CM

## 2017-06-20 MED ORDER — ONDANSETRON IVPB BCN JX
8 mg | Freq: Once | INTRAVENOUS | Status: CN
Start: 2017-06-20 — End: ?

## 2017-06-20 MED ORDER — DEXAMETHASONE IVPB BCN JX
4 mg | Freq: Once | INTRAVENOUS | Status: CP
Start: 2017-06-20 — End: ?

## 2017-06-20 MED ORDER — SODIUM CHLORIDE FLUSH 0.9 % IV SOLN
10 mL | Status: CN | PRN
Start: 2017-06-20 — End: ?

## 2017-06-20 MED ORDER — SODIUM CHLORIDE 0.9 % IV SOLN
Freq: Once | INTRAVENOUS | Status: CP
Start: 2017-06-20 — End: ?

## 2017-06-20 MED ORDER — FAMOTIDINE IVPB JX
20 mg | Freq: Once | INTRAVENOUS | Status: CN
Start: 2017-06-20 — End: ?

## 2017-06-20 MED ORDER — PALONOSETRON HCL 0.25 MG/5ML IV SOLN
.25 mg | Freq: Once | INTRAVENOUS | Status: CN
Start: 2017-06-20 — End: ?

## 2017-06-20 MED ORDER — HEPARIN SODIUM LOCK FLUSH 100 UNIT/ML IV SOLN
500 [IU] | Status: CN | PRN
Start: 2017-06-20 — End: ?

## 2017-06-20 MED ORDER — PROMETHAZINE IN NS IVPB JX
12.5 mg | Freq: Once | INTRAVENOUS | Status: CN
Start: 2017-06-20 — End: ?

## 2017-06-20 MED ORDER — LORAZEPAM 2 MG/ML IJ SOLN
.5 mg | Freq: Once | INTRAVENOUS | Status: CN
Start: 2017-06-20 — End: ?

## 2017-06-20 MED ORDER — DEXAMETHASONE IVPB BCN JX
4 mg | Freq: Once | INTRAVENOUS | Status: CN
Start: 2017-06-20 — End: ?

## 2017-06-20 MED ORDER — LORAZEPAM 2 MG/ML IJ SOLN
.5 mg | Freq: Once | INTRAVENOUS | Status: CP
Start: 2017-06-20 — End: ?

## 2017-06-20 MED ORDER — HEPARIN SODIUM LOCK FLUSH 100 UNIT/ML IV SOLN
500 [IU] | Status: DC | PRN
Start: 2017-06-20 — End: 2017-06-21

## 2017-06-20 MED ORDER — SODIUM CHLORIDE 0.9 % IV SOLN
Freq: Once | INTRAVENOUS | Status: CN
Start: 2017-06-20 — End: ?

## 2017-06-20 MED ORDER — DIPHENHYDRAMINE IVPB JX
25 mg | Freq: Once | INTRAVENOUS | Status: CN
Start: 2017-06-20 — End: ?

## 2017-06-20 MED ORDER — PALONOSETRON HCL 0.25 MG/5ML IV SOLN
.25 mg | Freq: Once | INTRAVENOUS | Status: CP
Start: 2017-06-20 — End: ?

## 2017-06-25 ENCOUNTER — Ambulatory Visit: Primary: Internal Medicine

## 2017-06-26 ENCOUNTER — Encounter: Primary: Internal Medicine

## 2017-06-27 ENCOUNTER — Ambulatory Visit: Primary: Internal Medicine

## 2017-06-27 ENCOUNTER — Ambulatory Visit: Admit: 2017-06-27 | Discharge: 2017-06-27 | Attending: Hematology & Oncology | Primary: Internal Medicine

## 2017-06-27 DIAGNOSIS — C169 Malignant neoplasm of stomach, unspecified: Secondary | ICD-10-CM

## 2017-06-27 DIAGNOSIS — M545 Low back pain: Secondary | ICD-10-CM

## 2017-06-27 DIAGNOSIS — F419 Anxiety disorder, unspecified: Secondary | ICD-10-CM

## 2017-06-27 DIAGNOSIS — R1013 Epigastric pain: Secondary | ICD-10-CM

## 2017-06-27 DIAGNOSIS — Z800 Family history of malignant neoplasm of digestive organs: Secondary | ICD-10-CM

## 2017-06-27 DIAGNOSIS — R634 Abnormal weight loss: Secondary | ICD-10-CM

## 2017-06-27 DIAGNOSIS — Z87891 Personal history of nicotine dependence: Secondary | ICD-10-CM

## 2017-06-27 DIAGNOSIS — E039 Hypothyroidism, unspecified: Secondary | ICD-10-CM

## 2017-06-27 DIAGNOSIS — K219 Gastro-esophageal reflux disease without esophagitis: Principal | ICD-10-CM

## 2017-06-27 DIAGNOSIS — Z791 Long term (current) use of non-steroidal anti-inflammatories (NSAID): Secondary | ICD-10-CM

## 2017-06-27 DIAGNOSIS — G8929 Other chronic pain: Secondary | ICD-10-CM

## 2017-06-27 DIAGNOSIS — Z8500 Personal history of malignant neoplasm of unspecified digestive organ: Secondary | ICD-10-CM

## 2017-06-27 DIAGNOSIS — C50911 Malignant neoplasm of unspecified site of right female breast: Secondary | ICD-10-CM

## 2017-06-27 DIAGNOSIS — R9389 Abnormal findings on diagnostic imaging of other specified body structures: Secondary | ICD-10-CM

## 2017-06-27 DIAGNOSIS — Z08 Encounter for follow-up examination after completed treatment for malignant neoplasm: Secondary | ICD-10-CM

## 2017-06-27 DIAGNOSIS — Z9221 Personal history of antineoplastic chemotherapy: Secondary | ICD-10-CM

## 2017-06-27 DIAGNOSIS — R1310 Dysphagia, unspecified: Secondary | ICD-10-CM

## 2017-06-27 DIAGNOSIS — Z8042 Family history of malignant neoplasm of prostate: Secondary | ICD-10-CM

## 2017-06-27 DIAGNOSIS — Z79899 Other long term (current) drug therapy: Secondary | ICD-10-CM

## 2017-06-27 DIAGNOSIS — D219 Benign neoplasm of connective and other soft tissue, unspecified: Secondary | ICD-10-CM

## 2017-06-27 DIAGNOSIS — K769 Liver disease, unspecified: Secondary | ICD-10-CM

## 2017-06-27 DIAGNOSIS — Z803 Family history of malignant neoplasm of breast: Secondary | ICD-10-CM

## 2017-06-27 DIAGNOSIS — M1990 Unspecified osteoarthritis, unspecified site: Secondary | ICD-10-CM

## 2017-06-27 DIAGNOSIS — M810 Age-related osteoporosis without current pathological fracture: Secondary | ICD-10-CM

## 2017-06-27 DIAGNOSIS — G629 Polyneuropathy, unspecified: Secondary | ICD-10-CM

## 2017-06-27 DIAGNOSIS — M47819 Spondylosis without myelopathy or radiculopathy, site unspecified: Secondary | ICD-10-CM

## 2017-06-27 NOTE — Progress Notes
difficult to administer due to poor tolerance. I plan to see her back again in 4 weeks with CBC CMP CEA for follow-up and also for further discussion and hopefully, she make up her mind about who should be the surgeon. I reviewed the labs and elected to continue with chemo therapy.  I spent more than 40 minutes including 30 minutes counseling time and answered all her questions to her satisfaction  May 23, 2017  Patient had good experience in [facility] and able to see oncologist Dr. Benjie Karvonen and surgeon. Patient decided to have her surgery done in [facility] which will be end of October. As preparation request I am holding one treatment this week and she will back again chemo therapy after holiday. Plan remain the same to give a course of chemotherapy and will have 4 weeks break before undergoing surgery. Patient tells me that Dr. Benjie Karvonen suggested to give him 12 treatments before the surgery but when I talked to Dr. Benjie Karvonen today  his recommendation remain the same to have the surgery after the 8 treatment and following surgery if feasible will give 4 more course of chemo therapy. I plan to see her back again in 2 weeks for follow-up as preparation request. I spent more than 35 minutes including 35 minutes counseling time answered all her questions to her satisfaction. I also discuss with Dr.Mody from [facility] as well  June 06, 2017  As per my previous discussion with the patient and Dr. Genia Harold from [facility] it was decided to complete 8 treatment with FOLFOX which will complete in October 4 and surgery has been scheduled end of October and [facility] as per patient request. Patient already seen the surgeon and surgery schedule has been made. I will continue with chemo therapy every 2 weeks and as I mentioned it will be finished by October 4. I advised her to take a multivitamin with iron supplement once daily to improve the anemia. Encouraged to eat better and gain stream or weight before the

## 2017-06-27 NOTE — Progress Notes
for a total of 8 treatment before undergoing surgery since after surgery it would be difficult to provide adjuvant chemo therapy.   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 She also had a J-tube placed for feeding purposes as well.  Patient's here for a follow-up regarding her cancer as well as chemo therapy. She was seen in [facility] by surgeon as well as oncologists Dr. Benjie Karvonen who agreed with current plan of care including completion of 8 treatment with FOLFOX followed by surgery. Patient would like to have surgery done in [facility] and has been scheduled for end of October. Patient is been feeling much better now able to swallow and keeping it down continues to use J-tube for nutritional purposes. She has been feeling a bit tired and fatigued but much better than before start having some numbness and tingling in hands and feet. 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 She also had a J-tube placed for feeding purposes as well.  Patient is here for follow-up as well as for chemotherapy. She completed 6

## 2017-06-27 NOTE — Progress Notes
proceed with surgery next month as planned. She also scheduled to have a PET CT scan next week. Patient denies any fever or chills cough chest pain or shortness of breath no headache or dizziness no abdominal discomfort, feeling very tired and fatigued, no recent weight loss  Interim medical history, surgical history family history social history were reviewed no change from previous visit    Past Surgical History:   Past Surgical History:   Procedure Laterality Date   ? BREAST RECONSTRUCTION  2016   ? CATARACT EXTRACTION W/ INTRAOCULAR LENS IMPLANT     ? COLONOSCOPY W/ BIOPSY  01/28/2017   ? EGD W/ BIOPSY  01/28/2017   ? EUS UPPER W/ FNA  02/08/2017   ? HYSTERECTOMY  2003    uterine fibroids   ? LASIK     ? MASTECTOMY  2011   Diagnostic laparoscopy and laparoscopic feeding jejunostomy tube placement 03/13/2017    Social History:    Social History     Social History   ? Marital status: Single     Spouse name: N/A   ? Number of children: 0   ? Years of education: N/A     Occupational History   ? Not on file.     Social History Main Topics   ? Smoking status: Former Smoker     Packs/day: 1.00     Years: 15.00     Types: Cigarettes     Start date: 06/24/1974     Quit date: 11/29/1988   ? Smokeless tobacco: Never Used   ? Alcohol use Yes   ? Drug use: Yes     Frequency: 7.0 times per week     Types: Marijuana   ? Sexual activity: Not on file     Other Topics Concern   ? Not on file     Social History Narrative   ? No narrative on file       Family History:    Family History   Problem Relation Age of Onset   ? Breast Cancer Mother    ? Prostate Cancer Father    ? Colon Cancer Father        Vital Signs:   Vitals:    06/27/17 1100   BP: 98/70   Pulse: 71   Resp: 16   Temp: 35.8 ?C (96.4 ?F)   Weight: 61.7 kg (136 lb)   Height: 1.702 m (5\' 7" )     Vitals:    06/27/17 1100   BP: 98/70   Pulse: 71   Resp: 16   Temp: 35.8 ?C (96.4 ?F)   Weight: 61.7 kg (136 lb)   Height: 1.702 m (5\' 7" )

## 2017-06-27 NOTE — Progress Notes
Inadvertent typographical errors, word omissions and/or word substitutions may exist.         Return to Clinic:  in 3 weeks.  fax  Dr. Rosaura Carpenter,  Dr. Carmelina Paddock, Dr. Freddrick March  .

## 2017-06-27 NOTE — Progress Notes
discomfort with 5-FU, less of alopecia and no major other side effects. In general this tablet treatment is relatively well tolerated without excessive cytopenias. I prescribed antinausea medications in case of chemotherapy associated nausea and vomiting, to her local pharmacy. We did add toward with the patient and the sister of our chemotherapy unit and introduce her to our chemotherapy nurses. I have prescribed chemotherapy to start tentatively on Wednesday, Feb 27, 2017., Achieved complete remission by PET CT scan 04/25/2017    Given her heavy family history of breast cancer, colon cancer, prostate cancer, I have requested a genetic evaluation testing by myriad laboratories. At [facility] [place] testing was requested and results need to be followed for molecular biology of the tumor to include MSI status or other clinically relevant molecular markers.    The patient will return to this clinic for follow-up, most probable with Dr. Lurena Joiner, in 2 weeks after her first round of chemotherapy to assess for toxicity and possible side effects.    The patient and her sister had a long list of questions regarding diagnosis, treatment, sequencing of multi modality treatments, side effects of chemotherapy and this questions were answered to the best of my knowledge during this initial evaluation.    It is of note is that the knee CT scan of the chest abdomen and pelvis from Feb 05, 2017 to indeterminate subcentimeter low-density lesions were noted within the lateral segment of the left hepatic lobe. These were biopsied by fine-needle aspiration at [facility] and were negative for malignancy. Most recent PET CT scan at Dr. Dario Guardian facility did not confirm liver metastasis.    With today's evaluation also I have requested baseline laboratory testing to include a CBC, CMP, LDH, CEA.  Status post laparoscopic feeding jejunostomy placement March 13, 2017      Recommendation/Plan:

## 2017-06-27 NOTE — Progress Notes
small hemangioma no obvious additional lesions are seen within the liver, there is a mass within the stomach consistent with patient's history of stomach cancer  I have reviewed these reports with the patient today March 04, 2017  EUS, staged as T3 N3, M1 although biopsy of the liver negative for malignancy as his MRI and the PET scan  04/25/2017 PET CT scan showed no evidence of residual or metastatic gastric cancer, no evidence of other malignancy, reviewed with the patient today 05/06/2017    Physical Exam:   Physical examination  Patient is alert, not in any acute distress , tired and fatigue, PS-0  HEENT, no pallor nor icterus, throat clear is a petechia  Neck, no lymphadenopathy, no thyromegaly, no lymphadenopathy  Respiratory system, bilateral mild wheezing no rhonchi or vascular  Cardiac, S1-S2 regular  Abdomen, soft nontender no hepatosplenomegaly, bowel sounds are active, no other mass palpable, the jejunostomy tube in place and functioning well  Musculoskeletal, no joint pain, no muscle pain, mild numbness of the feet and hands which is slowly getting worse   CNS, alert oriented ?3, able to more 4 extremities, mild peripheral neuropathy  No peripheral edema both feet  Psychiatric, not agitated or anxious  September 27,, 2018,Detailed physical examination performed as above      Assessment:    [name] with moderately to 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 The patient has a Mediport placed as of this Friday, 02/22/2017. I discussed today with her patient and her sister regarding her diagnosis, prognosis and treatments for this type of tumor. I explained in great detail the effects and side effects of chemotherapy especially neuropathy with oxaliplatin possible chest pain or

## 2017-06-27 NOTE — Progress Notes
Chauncey Reading, MD  [address]  [id number]  [facility] [place] [address]    Chief Complaint   Patient presents with   ? Follow-up     Gastric adenocarcinoma   The patient has been referred to this clinic for workup and evaluation and treatment of adenocarcinoma of the GE junction.    Patient Name: Mariah Cameron      Date Of Birth: 03-21-1956    History of Present Illness:      Mariah Cameron is a very pleasant 61 year old Caucasian female, who is visiting today with her sister, referred to this clinic with histological diagnosis by endoscopy of moderately to poorly differentiated adenocarcinoma of the GE junction extending into the lesser curvature of the stomach. The patient started having severe acid reflux syndrome in November 2017 and she ended up having an endoscopy on 01/28/2017 with also endoscopic ultrasound on 02/08/2017. Results of the pathology by biopsy of a fungating mass were discussed as above. According to the patient's report the tumor was HER-2 negative. She was initially evaluated by medical oncologist at [facility] [place] but she preferred to transfer her treatment to this facility and also to the [facility] and she has seen Dr. Freddrick March also as of yesterday, who recommended an MRI of the liver as well as neoadjuvant chemotherapy. I do not have results of her PET/CT scan but the patient told me verbally that this was negative for liver lesions, initially described at a recent CT scan of the upper and lower abdomen. There was no other distant metastasis but there was lymphadenopathy associated with the stomach cancer. The patient is scheduled to undergo a Mediport placement by Dr. Dara Lords as of Friday 02/22/2017. She was suggested to have FOLFOX chemotherapy as soon as possible.    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 She had

## 2017-06-27 NOTE — Progress Notes
Mariah Cameron to see whether she had molecular analysis sent  Interim medical history, surgical history, family history, social history were reviewed, no major change from last visit June 2018   March 07, 2017, chief complaints  Patient is recently found to have poorly differentiated carcinoma of the GE junction, T3 N3 M0, start her on chemotherapy with FOLFOX.  Patient is here complains of increasing nausea and vomiting, not able to keep anything down., She is having obstructive symptoms., She had fluids yesterday as well. Denies any fever or chills or cough or chest pain feeling very tired and fatigued lost 2-3 pounds last few days, I have discussed with Dr. 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 Patient denies any fever or chills cough chest pain or shortness of breath. Eating only very minimal using the J-tube. 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

## 2017-06-27 NOTE — Progress Notes
surgery after the surgery she might lose a few pounds. I plan to see her back again 4 weeks with CBC CMP and LDH for follow-up. Patient also scheduled PET/CT scan in couple weeks. I spent more than 35 minutes including 25 minutes counseling time and answered all her questions to her satisfaction    June 27, 2017   Since patient already started developing peripheral neuropathy had completed 7 treatments and also scheduled for total gastrectomy in 4 weeks, I believe it is reasonable to hold the 8 treatment next week as requested by the patient so that she can physically getting ready for the surgery. I had a long discussion with the patient about the need of surgery for curative intent. And also made available to her to discuss surgery in postoperative complications from any with one of my patient who had synchronous surgery almost a year ago and recovered very quickly and also completed postoperative chemotherapy. Mariah Cameron is able to talk to my patient Mariah Cameron extensively and decided to proceed with surgery next month and [facility]. I also advised her to keep the appointment for the PET scan next couple weeks and I plan to see her back again in 3-4 weeks with a CBC CMP for follow-up. I spent more than 45 minutes including 35 minutes counseling time and answered all her questions to her satisfaction   For rest of her medical problem and continue with current management and follow with her primary physician. I have encouraged her to call me directly if she has any questions or if I can be of any further assistance. Results of most recent studies discussed with patient. All questions were answered to patient's satisfaction. Patient agreeable to the above plan. More than 50% of that time was spent on coordination of care, counseling, further diagnostic and therapeutic clinic and follow-up care. This document was created using voice recognition software.

## 2017-06-27 NOTE — Progress Notes
EUS, HER-2/neu negative patient also had a J-tube placed for feeding purposes due to persistent nausea and vomiting,  Patient is here for follow-up, she has completed 2 courses of chemotherapy and tolerated it reasonably well although continued to have nausea. She has been using jejunostomy tube and tolerating with some eval. Although she is having some dehydration and Gave extra fluid last week. 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 Patient is been able to swallow better although continued to use jejunostomy tube for feeding. She has gained weight, feeling much better although continued Fatigue and tiredness. She has been seen at [facility] , but she would like to get second opinion from [facility] which will be next week. Patient has not decided who she wants to have surgery done although I strongly proposed that my experience with Dr. Freddrick March is remarkable that Dr. Freddrick March does minimal invasive surgery extremely well and has more experience than anybody in town. She would like to get an opinion from [facility] which she already arranged for next week. Since patient had such a good response I recommend to continue

## 2017-06-27 NOTE — Progress Notes
Allergies: Patient has no known allergies.    Review of Systems:    Constitutional:No more weight loss, mild fatigue, No fever, able to swallow real food  Eyes: No double vision  Ears,Nose, Mouth,Throat: No sinus trouble, No sore throat  Cardiac: No lightheadedness, No swelling in legs, No chest pain  Respiratory: No cough, No hemoptysis, No shortness of breath  Gastrointestinal:  Mild nausea, no vomiting No diarrhea, No abdominal pain. Mild dysphagia, jejunostomy tube in place  Genitourinary: No burning on urination  Musculosketal: No bone pain.   Skin: No skin rash  Neurological: No seizures, No loss of balance, No weakness of limbs, mild numbness of both feet and hands getting slowly worse  Psychiatric: Deferred  Hematologic/Lymphatic/Immunologic: No bleeding, No lumps in arm pits  Sept, 27, 2018, 12 system review done as above    Medications:   Current Outpatient Prescriptions:   ?  ALPRAZolam (XANAX) 0.5 MG PO Tablet, TK 1 T PO QHS, Disp: , Rfl: 2  ?  amoxicillin-clavulanate (AUGMENTIN) 400-57 MG PO Tablet Chewable, Chew 1 tablet 2 times daily for 1 day., Disp: 2 tablet, Rfl: 0  ?  ARMOUR THYROID 15 MG PO Tablet, TK 3 TS PO ONCE D OES, Disp: , Rfl: 3  ?  CARAFATE 1 GM/10ML PO Suspension, TK 10 ML PO TID OES 1 HOUR B MEALS AND HS, Disp: , Rfl: 2  ?  diclofenac (VOLTAREN GEL) 1 % TD Gel, APP AA BID UTD, Disp: , Rfl: 0  ?  eszopiclone (LUNESTA) 3 MG PO Tablet, , Disp: , Rfl: 2  ?  HYDROcodone-acetaminophen (NORCO) 10-325 MG PO Tablet, TK 1 T PO Q 6 H PRN, Disp: , Rfl: 0  ?  HYDROcodone-acetaminophen (NORCO) 5-325 MG PO Tablet, TK 1-2 TS PO Q 6 H, Disp: , Rfl: 0  ?  ibuprofen (ADVIL,MOTRIN) 600 MG PO Tablet, Take 1 tablet by mouth 3 times daily for 5 days., Disp: 15 tablet, Rfl: 0  ?  lidocaine-prilocaine (EMLA) 2.5-2.5 % EX Cream, Apply 30 minutes prior to treatment, Disp: 60 g, Rfl: 1  ?  omeprazole (PriLOSEC) 40 MG PO Capsule Delayed Release, TK 1 C PO QD AC, Disp: , Rfl: 5

## 2017-06-27 NOTE — Progress Notes
a wellness check in March 2018 and this was completely negative. The patient also has history of breast cancer in 2010 and she is status post right mastectomy with breast reconstruction. She did not receive chemotherapy but she was treated with Herceptin for 50 months by an oncologist in [place]. According to her report she could not tolerate tamoxifen. In 2016 she had an episode of vertigo and CT scan of the head showed dural thickness.    The patient has never noticed blood in the stool or in the urine, she never had the yellow jaundice, she never had hepatitis or blood transfusions. She has lost approximately 20 pounds since December 2017. She has difficulties with nutrition especially with solid food, as she has to cut the food in small pieces and chew for long time and she always gets pain postprandially.    The patient used to be a smoker for 10 years but she quit in 1989 and she smoked approximately one pack per day. She used to drink wine.    Regarding her family history her father has dementia and heart disease. Her mother had breast cancer at the age of 14 her great-grandmother also had breast cancer. There is no ovarian cancer in the family. Her father has history of prostate cancer and colon cancer. Her paternal grandmother had also colon cancer.    03/04/2017    Ms. 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 The patient started having severe acid reflux syndrome in November 2017 and she ended up having an endoscopy on 01/28/2017 with also endoscopic ultrasound on 02/08/2017. Results of the pathology by biopsy of a fungating mass were discussed as above. According to the patient's report the tumor was HER-2 negative. She was initially evaluated by medical oncologist at [facility] but

## 2017-06-27 NOTE — Progress Notes
?    ondansetron (ZOFRAN-ODT) 8 MG PO Tablet Disintegrating, Dissolve 1 tablet in mouth every 8 hours as needed for nausea., Disp: 30 tablet, Rfl: 11  ?  prochlorperazine (COMPAZINE) 10 MG PO Tablet, Take 1 tablet by mouth every 6 hours as needed., Disp: 60 tablet, Rfl: 11  ?  QUEtiapine (SEROquel) 50 MG PO Tablet, , Disp: , Rfl: 0      Labs:   Hospital Outpatient Visit on 06/18/2017   Component Date Value   ? WBC 06/18/2017 8.05    ? RBC 06/18/2017 3.57    ? Hemoglobin 06/18/2017 10.9*   ? Hematocrit 06/18/2017 33.0*   ? MCV 06/18/2017 92.4    ? [facility] 06/18/2017 30.5    ? MCHC 06/18/2017 33.0    ? RDW 06/18/2017 14.1    ? Platelet Count 06/18/2017 197    ? MPV 06/18/2017 10.1    ? Neutrophils Absolute 06/18/2017 4.32    ? Neutrophils % 06/18/2017 53.7    ? Lymphocytes Absolute 06/18/2017 2.62    ? Lymphocytes % 06/18/2017 32.5    ? Monocytes Absolute 06/18/2017 0.91    ? Monocytes % 06/18/2017 11.3    ? Eosinophils Absolute 06/18/2017 0.17    ? Eosinophils % 06/18/2017 2.1    ? Basophil Absolute 06/18/2017 0.03    ? BASOPHILS 06/18/2017 0.4    Office Visit on 06/06/2017   Component Date Value   ? Glucose 06/18/2017 82    ? Urea Nitrogen 06/18/2017 16    ? Creatinine 06/18/2017 0.54    ? EGFR 06/18/2017 104    ? Glom Filt Rate, Est Afri* 06/18/2017 121    ? BUN/Creatinine Ratio 99/83/3825 NOT APPLICABLE    ? Sodium 06/18/2017 141    ? Potassium 06/18/2017 4.6    ? Chloride 06/18/2017 106    ? CARBON DIOXIDE 06/18/2017 29    ? Calcium 06/18/2017 9.3    ? Protein, Total 06/18/2017 5.8*   ? ALBUMIN 06/18/2017 3.8    ? Globulin 06/18/2017 2.0    ? ALBUMIN/GLOBULIN RATIO 06/18/2017 1.9    ? Total Bilirubin 06/18/2017 0.3    ? Alkaline Phosphatase 06/18/2017 112    ? AST 06/18/2017 44*   ? ALT 06/18/2017 53*   ? LD 06/18/2017 168    Hospital Outpatient Visit on 06/04/2017   Component Date Value   ? WBC 06/04/2017 6.77    ? RBC 06/04/2017 3.34    ? Hemoglobin 06/04/2017 10.1*   ? Hematocrit 06/04/2017 31.1*

## 2017-06-27 NOTE — Progress Notes
?   MCV 06/04/2017 93.1    ? [place] 06/04/2017 30.2    ? MCHC 06/04/2017 32.5    ? RDW 06/04/2017 13.1    ? Platelet Count 06/04/2017 206    ? MPV 06/04/2017 10.3    ? Neutrophils Absolute 06/04/2017 3.43    ? Neutrophils % 06/04/2017 50.7    ? Lymphocytes Absolute 06/04/2017 2.42    ? Lymphocytes % 06/04/2017 35.7    ? Monocytes Absolute 06/04/2017 0.70    ? Monocytes % 06/04/2017 10.3    ? Eosinophils Absolute 06/04/2017 0.18    ? Eosinophils % 06/04/2017 2.7    ? Basophil Absolute 06/04/2017 0.04    ? BASOPHILS 06/04/2017 0.6    Orders Only on 06/04/2017   Component Date Value   ? Vitamin B-12 06/04/2017 >2000*   Office Visit on 05/23/2017   Component Date Value   ? Glucose 06/04/2017 105*   ? Urea Nitrogen 06/04/2017 11    ? Creatinine 06/04/2017 0.61    ? EGFR 06/04/2017 100    ? Glom Filt Rate, Est Afri* 06/04/2017 116    ? BUN/Creatinine Ratio 10/03/7251 NOT APPLICABLE    ? Sodium 06/04/2017 144    ? Potassium 06/04/2017 4.2    ? Chloride 06/04/2017 109    ? CARBON DIOXIDE 06/04/2017 27    ? Calcium 06/04/2017 9.3    ? Protein, Total 06/04/2017 5.7*   ? ALBUMIN 06/04/2017 3.6    ? Globulin 06/04/2017 2.1    ? ALBUMIN/GLOBULIN RATIO 06/04/2017 1.7    ? Total Bilirubin 06/04/2017 0.4    ? Alkaline Phosphatase 06/04/2017 119    ? AST 06/04/2017 28    ? ALT 06/04/2017 [facility]   Component Date Value   ? WBC 05/01/2017 5.79    ? RBC 05/01/2017 3.42    ? Hemoglobin 05/01/2017 10.7*   ? Hematocrit 05/01/2017 32.0*   ? MCV 05/01/2017 93.6    ? [place] 05/01/2017 31.3    ? MCHC 05/01/2017 33.4    ? RDW 05/01/2017 13.7    ? Platelet Count 05/01/2017 132*   ? MPV 05/01/2017 10.8    ? Neutrophils Absolute 05/01/2017 2.60    ? Neutrophils % 05/01/2017 44.9    ? Lymphocytes Absolute 05/01/2017 2.14    ? Lymphocytes % 05/01/2017 37.0    ? Monocytes Absolute 05/01/2017 0.84    ? Monocytes % 05/01/2017 14.5    ? Eosinophils Absolute 05/01/2017 0.17    ? Eosinophils % 05/01/2017 2.9

## 2017-06-27 NOTE — Progress Notes
treatment with FOLFOX and tolerated very well except mild numbness of hands and feet lasting for few days. She is gaining weight continue to use J-tube and also able to eat regular food. Patient is been scheduled to have surgery and of October. Plan is to complete chemotherapy 4 weeks before the surgery. I have discussed the case with Dr. Genia Harold from [facility] who agree about giving total 8 courses of FOLFOX before surgery and then depending on pathology, we may consider postoperative chemotherapy if she has persistent tumor. 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 She also had a J-tube placed for feeding purposes as well.  , Patient has completed 7 treatment with FOLFOX chemotherapy and continued to be in excellent remission. She has been scheduled Surgery at [facility] and of October. She started having some numbness in the hands and feet and also had difficulty recovering from the last chemotherapy she would like to skip day 8 treatment in preparation for the surgery I believe it is reasonable to do that so that she will be in a better shape for the proposed total gastrectomy. Patient does have some reservations about the surgery after talking few people at surgery and had problems. I have her talk to one of my patient Ms. Covie who had the same surgical procedure done by Dr. Freddrick March last year and recovered very well and back to work now. Mr. Sultan talk to Mrs. Covie  for a while and decided to

## 2017-06-27 NOTE — Progress Notes
she preferred to transfer her treatment to this facility and also to the [place] and she has seen Dr. Freddrick March also as of yesterday, who recommended an MRI of the liver as well as neoadjuvant chemotherapy. I do not have results of her PET/CT scan but the patient told me verbally that this was negative for liver lesions, initially described at a recent CT scan of the upper and lower abdomen. There was no other distant metastasis but there was lymphadenopathy associated with the stomach cancer. The patient is scheduled to undergo a Mediport placement by Dr. Dara Lords as of Friday 02/22/2017. She was suggested to have FOLFOX chemotherapy as soon as possible.  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 Patient couldn't tolerate tamoxifen, which has been discontinued. Patient also BRCA 1 and 2 negative in the past  Patient is here for the first course of chemotherapy with FOLFOX. She has many questions about her disease process as well as the biopsy results and also MRI results. I have discussed this in length with the patient and felt that currently she is only as stage III disease by MRI. And as per Dr. Freddrick March, surgeons recommendation I will proceed with FOLFOX chemotherapy at this time. Patient still has mild dysphagia but able to bring labs of protein supplements. Denies any fever or chills cough or chest pain or shortness of breath. I left a message with Dr. Robbie Lis, oncologist from [facility]

## 2017-06-27 NOTE — Progress Notes
?   Basophil Absolute 05/01/2017 0.04    ? BASOPHILS 05/01/2017 0.7      02/19/2017 CBC, CMP grossly normal, CEA 1.5, LDH 124  03/19/2017 CBC grossly normal except hemoglobin 11.5 CMP within normal limits, reviewed with the patient today March 19, 2017  04/02/2017 CBC grossly normal except hemoglobin 11.0 CMP grossly normal, reviewed with the patient and her sister today April 08, 2017 ,   05/01/2017 CBC grossly normal except hemoglobin 10.7, platelet 10/30/1998, CMP grossly normal except albumin 3.3, AST 103, reviewed with the patient today May 06, 2017  06/04/2017, CBC grossly normal except hemoglobin 10.1, CMP not available, B12 2000, reviewed with the patient today June 06, 2017  06/18/2017 CBC grossly normal except hemoglobin 10.8, CMP within normal limits reviewed with the patient and her friend today June 27, 2014    5/4/2018A. 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 SEE COMMENT    B. ANTRUM (ENDOSCOPIC BIOPSY):   -  MILD CHRONIC GASTRITIS WITH MINIMAL ACTIVITY.   -  NO HELICOBACTER PYLORI IDENTIFIED BY MORPHOLOGICAL EVALUATION.   -  NO INTESTINAL METAPLASIA, DYSPLASIA OR MALIGNANCY IDENTIFIED.    C. POLYP DESCENDING (ENDOSCOPIC BIOPSY):   -  TUBULAR ADENOMA   -  NEGATIVE FOR HIGH-GRADE DYSPLASIA AND MALIGNANCY  HER2 Non-Breast: NEGATIVE  Score: 1+    02/08/2017. LIVER (FINE NEEDLE ASPIRATION):   -  NO EVIDENCE OF MALIGNANCY IDENTIFIED.    -  SMALL DETACHED FRAGMENTS OF LIVER PARENCHYMA AND SUPERFICIAL GASTRIC        FOVEOLAR EPITHELIUM.    Imaging Results:     02/14/2017, PET/CT scan showed FDG uptake in the anterior wall of the antrum consistent with malignancy, 1.87 with a lymph node within the stomach and liver, no obvious liver metastasis  02/21/2017, MRI of the abdomen there is 4-5 mm nonenhancing lesion noted within the left lobe of the liver which were present and atypical cyst or

## 2017-06-27 NOTE — Progress Notes
placement and currently tolerating the tube feeding very well. I have reviewed the labs and will proceed with second course of chemotherapy with FOLFOX at this time. I also used emend to prevent nausea so so with chemo therapy. I'm happy to see that patient has no involvement of the omentum or the peritoneum. I still believe she'll be a candidate for curative surgery if she has lost want to chemotherapy well. I will continue FOLFOX chemotherapy every 2 weeks and see her back again in 4 weeks with CBC CMP for follow-up. I spent more than 40 minutes including 30 minutes counseling time and answered all their questions to their satisfaction also reviewed the recent hospitalization for surgery as well    April 09, 2017   Labs were reviewed today and elected to continue with chemotherapy next week she will receive the day 15 of course 2 of chemotherapy with FOLFOX. . Advised to use the jejunostomy tube frequently also flush it with water to prevent dehydration . The patient wants to she will try to drink soups as well. . I plan to see her back again in 2-3 weeks for follow-up with CBC and CMP. After 6 treatments I plan to repeat a PET CT scan for follow-up I spent more than 40 minutes including 30 minutes counseling time and answered all their questions to their satisfaction     May 06, 2017  I am very happy to see that PET/CT scan showed complete remission without any evidence of malignancy at this time. I will continue with chemo therapy preferably total of 8 treatment before undergoing surgery. After surgery will be difficult to provide adjuvant chemotherapy due to poor tolerance. But I will wait for her visit with [facility] and see what the surgeons offered there, if they want to  do the surgery after the 6 treatment, as planned before that will be okay too, then I would consider providing further adjuvant chemotherapy after the surgery even though

## 2017-06-27 NOTE — Progress Notes
I believe patient has stage III stomach cancer and will proceed with preop chemo therapy with FOLFOX regimen hopefully she will achieve complete remission. If she does have a good response she'll be a candidate for curative surgery. Also extensively discussed with him about the toxicities of chemo therapy which may include mild nausea vomiting alopecia myelosuppression, oxaliplatin-induced neuropathy, excessive sensitivity to cold temperature as well as occasional mucositis from 5-fluorouracil. Patient and her sister voiced her good understanding and are anxious to proceed with chemo therapy. I plan to see her back again in 10 days CBC CMP for follow-up  I also left the message with Dr. Robbie Lis to see whether he has sent specimen for [facility] molecular analysis if not I will send to [facility] for the same analysis  I spent more than 40 minutes including 30 minutes counseling time and answered all her questions to their satisfaction  March 07, 2017  This patient has persistent nausea vomiting and also has some obstructive symptoms from GE junction tumor, I have discussed the case with Dr. Freddrick March was suggested to have a jejunostomy tube placed for nutritional purposes. Dr. Freddrick March also talked to the patient to phone regarding the need of jejunostomy tube. Patient understand the situation and she is willing to proceed with the jejunostomy tube. Dr. Vilinda Blanks will make appropriate arrangements. I will give IV fluid today and tomorrow and advised to drink protein drinks as much she can. I also twice her that in today to have nausea vomiting she may need to go to the emergency room in May needs to be admitted to the hospital for jejunostomy tube and IV fluids. I plan to see her back again next week as planned before, I spent more than 35 minutes including 25 minutes counseling time and also discuss with Dr. Freddrick March    March 19, 2017  I'm happy to see that patient tolerated laparoscopic jejunostomy tube

## 2017-06-28 ENCOUNTER — Encounter: Primary: Internal Medicine

## 2017-07-02 ENCOUNTER — Inpatient Hospital Stay: Primary: Internal Medicine

## 2017-07-03 DIAGNOSIS — C169 Malignant neoplasm of stomach, unspecified: Principal | ICD-10-CM

## 2017-07-03 MED ORDER — LEVOFLOXACIN 500 MG PO TABS
500 mg | Freq: Every day | ORAL | 0 refills | Status: CP
Start: 2017-07-03 — End: 2018-01-30

## 2017-07-04 ENCOUNTER — Inpatient Hospital Stay: Attending: Hematology & Oncology | Primary: Internal Medicine

## 2017-07-04 ENCOUNTER — Ambulatory Visit: Primary: Internal Medicine

## 2017-07-16 ENCOUNTER — Inpatient Hospital Stay: Primary: Internal Medicine

## 2017-07-18 ENCOUNTER — Ambulatory Visit: Primary: Internal Medicine

## 2017-07-30 ENCOUNTER — Inpatient Hospital Stay: Primary: Internal Medicine

## 2017-08-01 ENCOUNTER — Ambulatory Visit: Primary: Internal Medicine

## 2017-08-13 ENCOUNTER — Ambulatory Visit: Primary: Internal Medicine

## 2017-08-15 ENCOUNTER — Ambulatory Visit: Primary: Internal Medicine

## 2017-08-19 DIAGNOSIS — C169 Malignant neoplasm of stomach, unspecified: Principal | ICD-10-CM

## 2017-08-20 ENCOUNTER — Inpatient Hospital Stay: Admit: 2017-08-20 | Discharge: 2017-08-21 | Primary: Internal Medicine

## 2017-08-20 DIAGNOSIS — C169 Malignant neoplasm of stomach, unspecified: Principal | ICD-10-CM

## 2017-08-26 ENCOUNTER — Ambulatory Visit: Admit: 2017-08-26 | Discharge: 2017-08-26 | Attending: Hematology & Oncology | Primary: Internal Medicine

## 2017-08-26 DIAGNOSIS — R1013 Epigastric pain: Secondary | ICD-10-CM

## 2017-08-26 DIAGNOSIS — M810 Age-related osteoporosis without current pathological fracture: Secondary | ICD-10-CM

## 2017-08-26 DIAGNOSIS — Z903 Acquired absence of stomach [part of]: Secondary | ICD-10-CM

## 2017-08-26 DIAGNOSIS — M1990 Unspecified osteoarthritis, unspecified site: Secondary | ICD-10-CM

## 2017-08-26 DIAGNOSIS — C169 Malignant neoplasm of stomach, unspecified: Secondary | ICD-10-CM

## 2017-08-26 DIAGNOSIS — K219 Gastro-esophageal reflux disease without esophagitis: Principal | ICD-10-CM

## 2017-08-26 DIAGNOSIS — Z90710 Acquired absence of both cervix and uterus: Secondary | ICD-10-CM

## 2017-08-26 DIAGNOSIS — C160 Malignant neoplasm of cardia: Secondary | ICD-10-CM

## 2017-08-26 DIAGNOSIS — K769 Liver disease, unspecified: Secondary | ICD-10-CM

## 2017-08-26 DIAGNOSIS — Z87891 Personal history of nicotine dependence: Secondary | ICD-10-CM

## 2017-08-26 DIAGNOSIS — G620 Drug-induced polyneuropathy: Secondary | ICD-10-CM

## 2017-08-26 DIAGNOSIS — Z803 Family history of malignant neoplasm of breast: Secondary | ICD-10-CM

## 2017-08-26 DIAGNOSIS — Z800 Family history of malignant neoplasm of digestive organs: Secondary | ICD-10-CM

## 2017-08-26 DIAGNOSIS — R109 Unspecified abdominal pain: Secondary | ICD-10-CM

## 2017-08-26 DIAGNOSIS — K7689 Other specified diseases of liver: Secondary | ICD-10-CM

## 2017-08-26 DIAGNOSIS — F419 Anxiety disorder, unspecified: Secondary | ICD-10-CM

## 2017-08-26 DIAGNOSIS — Z9011 Acquired absence of right breast and nipple: Secondary | ICD-10-CM

## 2017-08-26 DIAGNOSIS — R1310 Dysphagia, unspecified: Secondary | ICD-10-CM

## 2017-08-26 DIAGNOSIS — C50911 Malignant neoplasm of unspecified site of right female breast: Secondary | ICD-10-CM

## 2017-08-26 DIAGNOSIS — T451X5D Adverse effect of antineoplastic and immunosuppressive drugs, subsequent encounter: Secondary | ICD-10-CM

## 2017-08-26 DIAGNOSIS — Z8042 Family history of malignant neoplasm of prostate: Secondary | ICD-10-CM

## 2017-08-26 DIAGNOSIS — R9389 Abnormal findings on diagnostic imaging of other specified body structures: Secondary | ICD-10-CM

## 2017-08-26 DIAGNOSIS — E039 Hypothyroidism, unspecified: Secondary | ICD-10-CM

## 2017-08-26 DIAGNOSIS — R634 Abnormal weight loss: Secondary | ICD-10-CM

## 2017-08-26 DIAGNOSIS — D219 Benign neoplasm of connective and other soft tissue, unspecified: Secondary | ICD-10-CM

## 2017-08-26 MED ORDER — AMOXICILLIN-POT CLAVULANATE 875-125 MG PO TABS
ORAL_TABLET | Freq: Two times a day (BID) | 0 refills
Start: 2017-08-26 — End: 2018-01-30

## 2017-08-26 MED ORDER — DILAUDID 1 MG/ML PO LIQD
0 refills
Start: 2017-08-26 — End: ?

## 2017-08-26 MED ORDER — SENNA 8.8 MG/5ML PO SYRP
17.6 mg | ORAL
Start: 2017-08-26 — End: ?

## 2017-08-26 MED ORDER — MEDICAL MARIJUANA
600 mg | ORAL
Start: 2017-08-26 — End: 2017-12-10

## 2017-08-26 NOTE — Progress Notes
as well. . I plan to see her back again in 2-3 weeks for follow-up with CBC and CMP. After 6 treatments I plan to repeat a PET CT scan for follow-up I spent more than 40 minutes including 30 minutes counseling time and answered all their questions to their satisfaction     May 06, 2017  I am very happy to see that PET/CT scan showed complete remission without any evidence of malignancy at this time. I will continue with chemo therapy preferably total of 8 treatment before undergoing surgery. After surgery will be difficult to provide adjuvant chemotherapy due to poor tolerance. But I will wait for her visit with [facility] and see what the surgeons offered there, if they want to  do the surgery after the 6 treatment, as planned before that will be okay too, then I would consider providing further adjuvant chemotherapy after the surgery even though difficult to administer due to poor tolerance. I plan to see her back again in 4 weeks with CBC CMP CEA for follow-up and also for further discussion and hopefully, she make up her mind about who should be the surgeon. I reviewed the labs and elected to continue with chemo therapy.  I spent more than 40 minutes including 30 minutes counseling time and answered all her questions to her satisfaction  May 23, 2017  Patient had good experience in [facility] and able to see oncologist Dr. Benjie Karvonen and surgeon. Patient decided to have her surgery done in [facility] which will be end of October. As preparation request I am holding one treatment this week and she will back again chemo therapy after holiday. Plan remain the same to give a course of chemotherapy and will have 4 weeks break before undergoing surgery. Patient tells me that Dr. Benjie Karvonen suggested to give him 12 treatments before the surgery but when I talked to Dr. Benjie Karvonen today  his recommendation remain the same to have the surgery after the 8 treatment and following surgery if feasible will give 4 more

## 2017-08-26 NOTE — Progress Notes
Past Surgical History:   Past Surgical History:   Procedure Laterality Date   ? BREAST RECONSTRUCTION  2016   ? CATARACT EXTRACTION W/ INTRAOCULAR LENS IMPLANT     ? COLONOSCOPY W/ BIOPSY  01/28/2017   ? EGD W/ BIOPSY  01/28/2017   ? EUS UPPER W/ FNA  02/08/2017   ? HYSTERECTOMY  2003    uterine fibroids   ? LASIK     ? MASTECTOMY  2011   Diagnostic laparoscopy and laparoscopic feeding jejunostomy tube placement 03/13/2017  07/30/2017, laparotomy, total gastrectomy with en bloc left lateral lobe of the liver resection with D2 lymph node dissection, feeding jejunostomy    Social History:    Social History     Social History   ? Marital status: Single     Spouse name: N/A   ? Number of children: 0   ? Years of education: N/A     Occupational History   ? Not on file.     Social History Main Topics   ? Smoking status: Former Smoker     Packs/day: 1.00     Years: 15.00     Types: Cigarettes     Start date: 06/24/1974     Quit date: 11/29/1988   ? Smokeless tobacco: Never Used   ? Alcohol use Yes   ? Drug use: Yes     Frequency: 7.0 times per week     Types: Marijuana   ? Sexual activity: Not on file     Other Topics Concern   ? Not on file     Social History Narrative   ? No narrative on file       Family History:    Family History   Problem Relation Age of Onset   ? Breast Cancer Mother    ? Prostate Cancer Father    ? Colon Cancer Father        Vital Signs:   Vitals:    08/26/17 1050   BP: 94/63   Pulse: 74   Resp: 16   Temp: 36.2 ?C (97.1 ?F)   Weight: 60.1 kg (132 lb 6.4 oz)   Height: 1.702 m (5\' 7" )     Vitals:    08/26/17 1050   BP: 94/63   Pulse: 74   Resp: 16   Temp: 36.2 ?C (97.1 ?F)   Weight: 60.1 kg (132 lb 6.4 oz)   Height: 1.702 m (5\' 7" )       Allergies: Patient has no known allergies.    Review of Systems:    Constitutional:No more weight loss, mild fatigue, No fever, nothing by mouth  Eyes: No double vision  Ears,Nose, Mouth,Throat: No sinus trouble, No sore throat

## 2017-08-26 NOTE — Progress Notes
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 The patient has a Mediport placed as of this Friday, 02/22/2017. I discussed today with her patient and her sister regarding her diagnosis, prognosis and treatments for this type of tumor. I explained in great detail the effects and side effects of chemotherapy especially neuropathy with oxaliplatin possible chest pain or discomfort with 5-FU, less of alopecia and no major other side effects. In general this tablet treatment is relatively well tolerated without excessive cytopenias. I prescribed antinausea medications in case of chemotherapy associated nausea and vomiting, to her local pharmacy. We did add toward with the patient and the sister of our chemotherapy unit and introduce her to our chemotherapy nurses. I have prescribed chemotherapy to start tentatively on Wednesday, Feb 27, 2017., Achieved complete remission by PET CT scan 04/25/2017    Given her heavy family history of breast cancer, colon cancer, prostate cancer, I have requested a genetic evaluation testing by myriad laboratories. At [facility] [place] Foundation testing was requested and results need to be followed for molecular biology of the tumor to include MSI status or other clinically relevant molecular markers.    The patient will return to this clinic for follow-up, most probable with Dr. Lurena Joiner, in 2 weeks after her first round of chemotherapy to assess for toxicity and possible side effects.

## 2017-08-26 NOTE — Progress Notes
Cardiac: No lightheadedness, No swelling in legs, No chest pain  Respiratory: No cough, No hemoptysis, No shortness of breath  Gastrointestinal:  Mild nausea, no vomiting No diarrhea, status post laparotomy,, jejunostomy tube in place  Genitourinary: No burning on urination  Musculosketal: No bone pain.   Skin: No skin rash  Neurological: No seizures, No loss of balance, No weakness of limbs, mild numbness of both feet and hands getting slowly worse  Psychiatric: Deferred  Hematologic/Lymphatic/Immunologic: No bleeding, No lumps in arm pits  August 26, 2017, 12 system review done as above    Medications:   Current Outpatient Prescriptions:   ?  medical marijuana, Take 600 mg by mouth., Disp: , Rfl:   ?  Senna 8.8 MG/5ML PO Syrup, Take 17.6 mg by mouth., Disp: , Rfl:   ?  ALPRAZolam (XANAX) 0.5 MG PO Tablet, TK 1 T PO QHS, Disp: , Rfl: 2  ?  amoxicillin-clavulanate (AUGMENTIN) 400-57 MG PO Tablet Chewable, Chew 1 tablet 2 times daily for 1 day., Disp: 2 tablet, Rfl: 0  ?  amoxicillin-clavulanate (AUGMENTIN) 875-125 MG PO Tablet, every 12 hours., Disp: , Rfl: 0  ?  ARMOUR THYROID 15 MG PO Tablet, TK 3 TS PO ONCE D OES, Disp: , Rfl: 3  ?  CARAFATE 1 GM/10ML PO Suspension, TK 10 ML PO TID OES 1 HOUR B MEALS AND HS, Disp: , Rfl: 2  ?  diclofenac (VOLTAREN GEL) 1 % TD Gel, APP AA BID UTD, Disp: , Rfl: 0  ?  DILAUDID 1 MG/ML PO Liquid, , Disp: , Rfl: 0  ?  eszopiclone (LUNESTA) 3 MG PO Tablet, , Disp: , Rfl: 2  ?  HYDROcodone-acetaminophen (NORCO) 10-325 MG PO Tablet, TK 1 T PO Q 6 H PRN, Disp: , Rfl: 0  ?  HYDROcodone-acetaminophen (NORCO) 5-325 MG PO Tablet, TK 1-2 TS PO Q 6 H, Disp: , Rfl: 0  ?  ibuprofen (ADVIL,MOTRIN) 600 MG PO Tablet, Take 1 tablet by mouth 3 times daily for 5 days., Disp: 15 tablet, Rfl: 0  ?  levoFLOXacin (LEVAQUIN) 500 MG PO Tablet, Take 1 tablet by mouth daily for 7 days., Disp: 7 tablet, Rfl: 0  ?  lidocaine-prilocaine (EMLA) 2.5-2.5 % EX Cream, Apply 30 minutes prior

## 2017-08-26 NOTE — Progress Notes
EUS, HER-2/neu negative patient also had a J-tube placed for feeding purposes due to persistent nausea and vomiting,  Patient is here for follow-up, she has completed 2 courses of chemotherapy and tolerated it reasonably well although continued to have nausea. She has been using jejunostomy tube and tolerating with some eval. Although she is having some dehydration and Gave extra fluid last week. 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 Patient is been able to swallow better although continued to use jejunostomy tube for feeding. She has gained weight, feeling much better although continued Fatigue and tiredness. She has been seen at [facility] , but she would like to get second opinion from [facility] which will be next week. Patient has not decided who she wants to have surgery done although I strongly proposed that my experience with Dr. Freddrick March is remarkable that Dr. Freddrick March does minimal invasive surgery extremely well and has more experience than anybody in town. She would like to get an opinion from [facility] which she already arranged for next week. Since patient had such a good response I recommend to continue

## 2017-08-26 NOTE — Progress Notes
The patient and her sister had a long list of questions regarding diagnosis, treatment, sequencing of multi modality treatments, side effects of chemotherapy and this questions were answered to the best of my knowledge during this initial evaluation.    It is of note is that the knee CT scan of the chest abdomen and pelvis from Feb 05, 2017 to indeterminate subcentimeter low-density lesions were noted within the lateral segment of the left hepatic lobe. These were biopsied by fine-needle aspiration at [facility] and were negative for malignancy. Most recent PET CT scan at Dr. Dario Guardian facility did not confirm liver metastasis.    With today's evaluation also I have requested baseline laboratory testing to include a CBC, CMP, LDH, CEA.  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 If she does have a good response she'll be a candidate for curative surgery. Also extensively discussed with him about the toxicities of chemo therapy which may include mild nausea vomiting alopecia myelosuppression, oxaliplatin-induced neuropathy, excessive sensitivity to cold temperature as well as occasional mucositis from 5-fluorouracil. Patient and her sister voiced her good understanding and are anxious to proceed with chemo therapy. I plan to see her back again in 10 days CBC CMP for follow-up  I also left the message with Dr. Robbie Lis to see whether he has sent specimen for [facility] molecular analysis if not I will send to [facility] for the same analysis  I spent more than 40 minutes including 30 minutes counseling time and answered all her questions to their satisfaction  March 07, 2017  This patient has persistent nausea vomiting and also has some obstructive

## 2017-08-26 NOTE — Progress Notes
month and [facility]. I also advised her to keep the appointment for the PET scan next couple weeks and I plan to see her back again in 3-4 weeks with a CBC CMP for follow-up. I spent more than 45 minutes including 35 minutes counseling time and answered all her questions to her satisfaction     August 26, 2017   Although patient had R0 resection, she did have local extension to the liver which has been resected, 10 out of 16 lymph node positive, she is in high risk for recurrence and metastasis. 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 Genia Harold regarding this matter and advised to make an appointment in next 2 weeks with Dr. Genia Harold. Hopefully her postoperative leak will be resolved so on and able to start taking oral nourishment. I had long discussion with the patient about weight is options and also her prognosis would be better due to surgery, although long-term survival is not conceivable . Patient no stable understanding and achy to obtain opinion from Dr. Genia Harold and keep her appointment with her surgeon as well. I plan to see her back again 3 weeks with CBC CMP CEA, iron studies for follow-up. I spent more than 45 minutes including 35 minutes counseling time, also reviewed the medical records from [facility] including pathology report. And answered all her questions to her satisfaction   For rest of her medical problem and continue with current management and follow with her primary physician. I have encouraged her to call me directly if she has any questions or if I can be of any further assistance. Results of most recent studies discussed with patient. All questions were answered to patient's satisfaction. Patient agreeable to

## 2017-08-26 NOTE — Progress Notes
02/19/2017 CBC, CMP grossly normal, CEA 1.5, LDH 124  03/19/2017 CBC grossly normal except hemoglobin 11.5 CMP within normal limits, reviewed with the patient today March 19, 2017  04/02/2017 CBC grossly normal except hemoglobin 11.0 CMP grossly normal, reviewed with the patient and her sister today April 08, 2017 ,   05/01/2017 CBC grossly normal except hemoglobin 10.7, platelet 10/30/1998, CMP grossly normal except albumin 3.3, AST 103, reviewed with the patient today May 06, 2017  06/04/2017, CBC grossly normal except hemoglobin 10.1, CMP not available, B12 2000, reviewed with the patient today June 06, 2017  06/18/2017 CBC grossly normal except hemoglobin 10.8, CMP within normal limits reviewed with the patient and her friend today June 27, 2014  08/20/2017 CBC grossly normal except hemoglobin 11.0, CMP within normal limit except alkaline phosphatase 166, T3-T4 TSH, normal reviewed with the patient today August 26, 2017    5/4/2018A. 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 SEE COMMENT    B. ANTRUM (ENDOSCOPIC BIOPSY):   -  MILD CHRONIC GASTRITIS WITH MINIMAL ACTIVITY.   -  NO HELICOBACTER PYLORI IDENTIFIED BY MORPHOLOGICAL EVALUATION.   -  NO INTESTINAL METAPLASIA, DYSPLASIA OR MALIGNANCY IDENTIFIED.    C. POLYP DESCENDING (ENDOSCOPIC BIOPSY):   -  TUBULAR ADENOMA   -  NEGATIVE FOR HIGH-GRADE DYSPLASIA AND MALIGNANCY  HER2 Non-Breast: NEGATIVE  Score: 1+    02/08/2017. LIVER (FINE NEEDLE ASPIRATION):   -  NO EVIDENCE OF MALIGNANCY IDENTIFIED.    -  SMALL DETACHED FRAGMENTS OF LIVER PARENCHYMA AND SUPERFICIAL GASTRIC        FOVEOLAR EPITHELIUM.    07/30/2017 total gastrectomy/ en bloc left lateral lobe liver resection with D2 lymph node dissection  Residual adenocarcinoma located in the gastric cardia and body with involvement of the gastroesophageal junction and direct invasion of the

## 2017-08-26 NOTE — Progress Notes
Assunta Gambles, MD  [address]  [place] [id number]  [facility] [place] [address]    Chief Complaint   Patient presents with   ? Follow-up     Gastric adenocarcinoma   The patient has been referred to this clinic for workup and evaluation and treatment of adenocarcinoma of the GE junction.    Patient Name: Mariah Cameron      Date Of Birth: 12-Aug-1956    History of Present Illness:      Ms. 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 The patient started having severe acid reflux syndrome in November 2017 and she ended up having an endoscopy on 01/28/2017 with also endoscopic ultrasound on 02/08/2017. Results of the pathology by biopsy of a fungating mass were discussed as above. According to the patient's report the tumor was HER-2 negative. She was initially evaluated by medical oncologist at [facility] [place] but she preferred to transfer her treatment to this facility and also to the [place] and she has seen Dr. Freddrick March also as of yesterday, who recommended an MRI of the liver as well as neoadjuvant chemotherapy. I do not have results of her PET/CT scan but the patient told me verbally that this was negative for liver lesions, initially described at a recent CT scan of the upper and lower abdomen. There was no other distant metastasis but there was lymphadenopathy associated with the stomach cancer. The patient is scheduled to undergo a Mediport placement by Dr. Dara Lords as of Friday 02/22/2017. She was suggested to have FOLFOX chemotherapy as soon as possible.    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 She had

## 2017-08-26 NOTE — Progress Notes
for a total of 8 treatment before undergoing surgery since after surgery it would be difficult to provide adjuvant chemo therapy.   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 She also had a J-tube placed for feeding purposes as well.  Patient's here for a follow-up regarding her cancer as well as chemo therapy. She was seen in [facility] by surgeon as well as oncologists Dr. Benjie Karvonen who agreed with current plan of care including completion of 8 treatment with FOLFOX followed by surgery. Patient would like to have surgery done in [facility] and has been scheduled for end of October. Patient is been feeling much better now able to swallow and keeping it down continues to use J-tube for nutritional purposes. She has been feeling a bit tired and fatigued but much better than before start having some numbness and tingling in hands and feet. 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 She also had a J-tube placed for feeding purposes as well.  Patient is here for follow-up as well as for chemotherapy. She completed 6

## 2017-08-26 NOTE — Progress Notes
symptoms from GE junction tumor, I have discussed the case with Dr. Freddrick March was suggested to have a jejunostomy tube placed for nutritional purposes. Dr. Freddrick March also talked to the patient to phone regarding the need of jejunostomy tube. Patient understand the situation and she is willing to proceed with the jejunostomy tube. Dr. Vilinda Blanks will make appropriate arrangements. I will give IV fluid today and tomorrow and advised to drink protein drinks as much she can. I also twice her that in today to have nausea vomiting she may need to go to the emergency room in May needs to be admitted to the hospital for jejunostomy tube and IV fluids. I plan to see her back again next week as planned before, I spent more than 35 minutes including 25 minutes counseling time and also discuss with Dr. Freddrick March    March 19, 2017  I'm happy to see that patient tolerated laparoscopic jejunostomy tube placement and currently tolerating the tube feeding very well. I have reviewed the labs and will proceed with second course of chemotherapy with FOLFOX at this time. I also used emend to prevent nausea so so with chemo therapy. I'm happy to see that patient has no involvement of the omentum or the peritoneum. I still believe she'll be a candidate for curative surgery if she has lost want to chemotherapy well. I will continue FOLFOX chemotherapy every 2 weeks and see her back again in 4 weeks with CBC CMP for follow-up. I spent more than 40 minutes including 30 minutes counseling time and answered all their questions to their satisfaction also reviewed the recent hospitalization for surgery as well    April 09, 2017   Labs were reviewed today and elected to continue with chemotherapy next week she will receive the day 15 of course 2 of chemotherapy with FOLFOX. . Advised to use the jejunostomy tube frequently also flush it with water to prevent dehydration . The patient wants to she will try to drink soups

## 2017-08-26 NOTE — Progress Notes
a wellness check in March 2018 and this was completely negative. The patient also has history of breast cancer in 2010 and she is status post right mastectomy with breast reconstruction. She did not receive chemotherapy but she was treated with Herceptin for 50 months by an oncologist in [place]. According to her report she could not tolerate tamoxifen. In 2016 she had an episode of vertigo and CT scan of the head showed dural thickness.    The patient has never noticed blood in the stool or in the urine, she never had the yellow jaundice, she never had hepatitis or blood transfusions. She has lost approximately 20 pounds since December 2017. She has difficulties with nutrition especially with solid food, as she has to cut the food in small pieces and chew for long time and she always gets pain postprandially.    The patient used to be a smoker for 10 years but she quit in 1989 and she smoked approximately one pack per day. She used to drink wine.    Regarding her family history her father has dementia and heart disease. Her mother had breast cancer at the age of 14 her great-grandmother also had breast cancer. There is no ovarian cancer in the family. Her father has history of prostate cancer and colon cancer. Her paternal grandmother had also colon cancer.    03/04/2017    Ms. 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 The patient started having severe acid reflux syndrome in November 2017 and she ended up having an endoscopy on 01/28/2017 with also endoscopic ultrasound on 02/08/2017. Results of the pathology by biopsy of a fungating mass were discussed as above. According to the patient's report the tumor was HER-2 negative. She was initially evaluated by medical oncologist at [facility] but

## 2017-08-26 NOTE — Progress Notes
Mariah Cameron preferred to transfer her treatment to this facility and also to the [place] and Mariah Cameron has seen Dr. Freddrick March also as of yesterday, who recommended an MRI of the liver as well as neoadjuvant chemotherapy. I do not have results of her PET/CT scan but the patient told me verbally that this was negative for liver lesions, initially described at a recent CT scan of the upper and lower abdomen. There was no other distant metastasis but there was lymphadenopathy associated with the stomach cancer. The patient is scheduled to undergo a Mediport placement by Dr. Dara Lords as of Friday 02/22/2017. Mariah Cameron was suggested to have FOLFOX chemotherapy as soon as possible.  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 Patient couldn't tolerate tamoxifen, which has been discontinued. Patient also BRCA 1 and 2 negative in the past  Patient is here for the first course of chemotherapy with FOLFOX. Mariah Cameron has many questions about her disease process as well as the biopsy results and also MRI results. I have discussed this in length with the patient and felt that currently Mariah Cameron is only as stage III disease by MRI. And as per Dr. Freddrick March, surgeons recommendation I will proceed with FOLFOX chemotherapy at this time. Patient still has mild dysphagia but able to bring labs of protein supplements. Denies any fever or chills cough or chest pain or shortness of breath. I left a message with Dr. Robbie Lis, oncologist from [facility]

## 2017-08-26 NOTE — Progress Notes
the above plan. More than 50% of that time was spent on coordination of care, counseling, further diagnostic and therapeutic clinic and follow-up care. This document was created using voice recognition software. Inadvertent typographical errors, word omissions and/or word substitutions may exist.         Return to Clinic:  in 3 weeks.  fax  Dr. Rosaura Carpenter,  Dr. Tamsen Meek [place]  .

## 2017-08-26 NOTE — Progress Notes
to treatment, Disp: 60 g, Rfl: 1  ?  omeprazole (PriLOSEC) 40 MG PO Capsule Delayed Release, TK 1 C PO QD AC, Disp: , Rfl: 5  ?  ondansetron (ZOFRAN-ODT) 8 MG PO Tablet Disintegrating, Dissolve 1 tablet in mouth every 8 hours as needed for nausea., Disp: 30 tablet, Rfl: 11  ?  prochlorperazine (COMPAZINE) 10 MG PO Tablet, Take 1 tablet by mouth every 6 hours as needed., Disp: 60 tablet, Rfl: 11  ?  QUEtiapine (SEROquel) 50 MG PO Tablet, , Disp: , Rfl: 0      Labs:   Orders Only on 08/19/2017   Component Date Value   ? TSH, 3RD GENERATION 08/20/2017 3.02    ? Free T4 08/20/2017 1.2    ? T3 Free 08/20/2017 3.0    Office Visit on 06/27/2017   Component Date Value   ? WHITE BLOOD CELL COUNT 08/20/2017 10.9*   ? RBC 08/20/2017 3.83    ? Hemoglobin 08/20/2017 11.0*   ? Hematocrit 08/20/2017 33.9*   ? MCV 08/20/2017 88.5    ? [facility] 08/20/2017 28.7    ? MCHC 08/20/2017 32.4    ? RDW 08/20/2017 14.3    ? Platelets 08/20/2017 748*   ? MPV 08/20/2017 9.6    ? Neutrophils Absolute 08/20/2017 6747    ? Lymphocytes Absolute 08/20/2017 2714    ? Monocytes Absolute 08/20/2017 970*   ? Eosinophils Absolute 08/20/2017 371    ? Basophils Absolute 08/20/2017 98    ? Neutrophils 08/20/2017 61.9    ? LYMPHOCYTES 08/20/2017 24.9    ? MONOCYTES 08/20/2017 8.9    ? EOSINOPHILS 08/20/2017 3.4    ? BASOPHILS 08/20/2017 0.9    ? Glucose 08/20/2017 101*   ? Urea Nitrogen 08/20/2017 15    ? Creatinine 08/20/2017 0.59    ? EGFR 08/20/2017 100    ? Glom Filt Rate, Est Afri* 08/20/2017 116    ? BUN/Creatinine Ratio 18/56/3149 NOT APPLICABLE    ? Sodium 08/20/2017 139    ? Potassium 08/20/2017 4.3    ? Chloride 08/20/2017 101    ? CARBON DIOXIDE 08/20/2017 28    ? Calcium 08/20/2017 9.8    ? Protein, Total 08/20/2017 6.5    ? ALBUMIN 08/20/2017 3.7    ? Globulin 08/20/2017 2.8    ? ALBUMIN/GLOBULIN RATIO 08/20/2017 1.3    ? Total Bilirubin 08/20/2017 0.2    ? Alkaline Phosphatase 08/20/2017 166*   ? AST 08/20/2017 17    ? ALT 08/20/2017 15

## 2017-08-26 NOTE — Progress Notes
treatment with FOLFOX and tolerated very well except mild numbness of hands and feet lasting for few days. She is gaining weight continue to use J-tube and also able to eat regular food. Patient is been scheduled to have surgery and of October. Plan is to complete chemotherapy 4 weeks before the surgery. I have discussed the case with Dr. Genia Harold from [facility] who agree about giving total 8 courses of FOLFOX before surgery and then depending on pathology, we may consider postoperative chemotherapy if she has persistent tumor. 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 She also had a J-tube placed for feeding purposes as well.  , Patient has completed 7 treatment with FOLFOX chemotherapy and continued to be in excellent remission. She has been scheduled Surgery at [facility] and of October. She started having some numbness in the hands and feet and also had difficulty recovering from the last chemotherapy she would like to skip day 8 treatment in preparation for the surgery I believe it is reasonable to do that so that she will be in a better shape for the proposed total gastrectomy. Patient does have some reservations about the surgery after talking few people at surgery and had problems. I have her talk to one of my patient Ms. Covie who had the same surgical procedure done by Dr. Freddrick March last year and recovered very well and back to work now. Mr. Carton talk to Mrs. Covie  for a while and decided to

## 2017-08-26 NOTE — Addendum Note
Addended by: Rogene Houston on: 08/26/2017 04:04 PM     Modules accepted: Level of Service

## 2017-08-26 NOTE — Progress Notes
course of chemo therapy. I plan to see her back again in 2 weeks for follow-up as preparation request. I spent more than 35 minutes including 35 minutes counseling time answered all her questions to her satisfaction. I also discuss with Dr.Mody from [facility] as well  June 06, 2017  As per my previous discussion with the patient and Dr. Genia Harold from [facility] it was decided to complete 8 treatment with FOLFOX which will complete in October 4 and surgery has been scheduled end of October and [facility] as per patient request. Patient already seen the surgeon and surgery schedule has been made. I will continue with chemo therapy every 2 weeks and as I mentioned it will be finished by October 4. I advised her to take a multivitamin with iron supplement once daily to improve the anemia. Encouraged to eat better and gain stream or weight before the surgery after the surgery she might lose a few pounds. I plan to see her back again 4 weeks with CBC CMP and LDH for follow-up. Patient also scheduled PET/CT scan in couple weeks. I spent more than 35 minutes including 25 minutes counseling time and answered all her questions to her satisfaction    June 27, 2017   Since patient already started developing peripheral neuropathy had completed 7 treatments and also scheduled for total gastrectomy in 4 weeks, I believe it is reasonable to hold the 8 treatment next week as requested by the patient so that she can physically getting ready for the surgery. I had a long discussion with the patient about the need of surgery for curative intent. And also made available to her to discuss surgery in postoperative complications from any with one of my patient who had synchronous surgery almost a year ago and recovered very quickly and also completed postoperative chemotherapy. Mrs. Guinther is able to talk to my patient Mrs. Covie extensively and decided to proceed with surgery next

## 2017-08-26 NOTE — Progress Notes
Mariah Cameron to see whether she had molecular analysis sent  Interim medical history, surgical history, family history, social history were reviewed, no major change from last visit June 2018   March 07, 2017, chief complaints  Patient is recently found to have poorly differentiated carcinoma of the GE junction, T3 N3 M0, start her on chemotherapy with FOLFOX.  Patient is here complains of increasing nausea and vomiting, not able to keep anything down., She is having obstructive symptoms., She had fluids yesterday as well. Denies any fever or chills or cough or chest pain feeling very tired and fatigued lost 2-3 pounds last few days, I have discussed with Dr. 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 Patient denies any fever or chills cough chest pain or shortness of breath. Eating only very minimal using the J-tube. 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

## 2017-08-26 NOTE — Progress Notes
proceed with surgery next month as planned. She also scheduled to have a PET CT scan next week. 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 She also had a J-tube placed for feeding purposes as well.  Since I saw her last, patient underwent diagnostic laparoscopic conversion to open laparotomy, total gastrectomy with en bloc left lateral lobe of the liver resection with D2 lymph node dissection, feeding jejunostomy in July 30, [facility] by Dr. Shade Flood. Patient tolerated the procedure well although developed leaking of the gastroesophagitis to ecchymosis and stayed in the hospital for 16 days. She is records from that and currently using G-tube feeding. She has an appointment to have upper GI done again to see whether there is any more leakage. Patient continued to be nothing by mouth. Path report did show local infiltration to the liver which has been dissected and 10 of 16 lymph nodes were positive with extracapsular extension  Patient is doing better although feeling little bit tired and fatigue, continued to have mild abdominal discomfort, using the G-tube for feeding, she lost a few pounds but looking good. Denies any fever chills cough chest pain or shortness of breath, no headache or dizziness no bleeding problems  Interim medical history and surgical history were reviewed as above

## 2017-08-26 NOTE — Progress Notes
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 [10/ 16 ] with extracapsular extension, surgical margins are negative for carcinoma    Imaging Results:     02/14/2017, PET/CT scan showed FDG uptake in the anterior wall of the antrum consistent with malignancy, 1.87 with a lymph node within the stomach and liver, no obvious liver metastasis  02/21/2017, MRI of the abdomen there is 4-5 mm nonenhancing lesion noted within the left lobe of the liver which were present and atypical cyst or small hemangioma no obvious additional lesions are seen within the liver, there is a mass within the stomach consistent with patient's history of stomach cancer  I have reviewed these reports with the patient today March 04, 2017  EUS, staged as T3 N3, M1 although biopsy of the liver negative for malignancy as his MRI and the PET scan  04/25/2017 PET CT scan showed no evidence of residual or metastatic gastric cancer, no evidence of other malignancy, reviewed with the patient today 05/06/2017    Physical Exam:   Physical examination  Patient is alert, not in any acute distress , tired and fatigue, PS-0  HEENT, no pallor nor icterus, throat clear is a petechia  Neck, no lymphadenopathy, no thyromegaly, no lymphadenopathy  Respiratory system, bilateral mild wheezing no rhonchi or vascular  Cardiac, S1-S2 regular  Abdomen, soft nontender no hepatosplenomegaly, bowel sounds are active, no other mass palpable, laparotomy  hea incisionled well,, feeding jejunostomy in place  Musculoskeletal, no joint pain, no muscle pain, mild numbness of the feet and hands which is slowly getting worse   CNS, alert oriented ?3, able to more 4 extremities, mild peripheral neuropathy  No peripheral edema both feet  Psychiatric, not agitated or anxious  August 26, 2017,,Detailed physical examination performed as above      Assessment:

## 2017-08-27 ENCOUNTER — Ambulatory Visit: Primary: Internal Medicine

## 2017-08-29 ENCOUNTER — Ambulatory Visit: Primary: Internal Medicine

## 2017-09-04 NOTE — Addendum Note
Addended by: Rachell Cipro on: 09/04/2017 03:15 PM     Modules accepted: Orders

## 2017-09-09 ENCOUNTER — Inpatient Hospital Stay: Admit: 2017-09-09 | Discharge: 2017-09-10 | Primary: Internal Medicine

## 2017-09-09 DIAGNOSIS — C169 Malignant neoplasm of stomach, unspecified: Principal | ICD-10-CM

## 2017-09-16 ENCOUNTER — Ambulatory Visit: Admit: 2017-09-16 | Discharge: 2017-09-16 | Attending: Hematology & Oncology | Primary: Internal Medicine

## 2017-09-16 DIAGNOSIS — R1013 Epigastric pain: Secondary | ICD-10-CM

## 2017-09-16 DIAGNOSIS — C169 Malignant neoplasm of stomach, unspecified: Secondary | ICD-10-CM

## 2017-09-16 DIAGNOSIS — M1990 Unspecified osteoarthritis, unspecified site: Secondary | ICD-10-CM

## 2017-09-16 DIAGNOSIS — C160 Malignant neoplasm of cardia: Principal | ICD-10-CM

## 2017-09-16 DIAGNOSIS — K7689 Other specified diseases of liver: Secondary | ICD-10-CM

## 2017-09-16 DIAGNOSIS — R200 Anesthesia of skin: Secondary | ICD-10-CM

## 2017-09-16 DIAGNOSIS — Z87891 Personal history of nicotine dependence: Secondary | ICD-10-CM

## 2017-09-16 DIAGNOSIS — M545 Low back pain: Secondary | ICD-10-CM

## 2017-09-16 DIAGNOSIS — K219 Gastro-esophageal reflux disease without esophagitis: Principal | ICD-10-CM

## 2017-09-16 DIAGNOSIS — D219 Benign neoplasm of connective and other soft tissue, unspecified: Secondary | ICD-10-CM

## 2017-09-16 DIAGNOSIS — R634 Abnormal weight loss: Secondary | ICD-10-CM

## 2017-09-16 DIAGNOSIS — R9389 Abnormal findings on diagnostic imaging of other specified body structures: Secondary | ICD-10-CM

## 2017-09-16 DIAGNOSIS — Z803 Family history of malignant neoplasm of breast: Secondary | ICD-10-CM

## 2017-09-16 DIAGNOSIS — K769 Liver disease, unspecified: Secondary | ICD-10-CM

## 2017-09-16 DIAGNOSIS — M810 Age-related osteoporosis without current pathological fracture: Secondary | ICD-10-CM

## 2017-09-16 DIAGNOSIS — Z8042 Family history of malignant neoplasm of prostate: Secondary | ICD-10-CM

## 2017-09-16 DIAGNOSIS — Z8509 Personal history of malignant neoplasm of other digestive organs: Secondary | ICD-10-CM

## 2017-09-16 DIAGNOSIS — F419 Anxiety disorder, unspecified: Secondary | ICD-10-CM

## 2017-09-16 DIAGNOSIS — C50911 Malignant neoplasm of unspecified site of right female breast: Secondary | ICD-10-CM

## 2017-09-16 DIAGNOSIS — Z800 Family history of malignant neoplasm of digestive organs: Secondary | ICD-10-CM

## 2017-09-16 DIAGNOSIS — E039 Hypothyroidism, unspecified: Secondary | ICD-10-CM

## 2017-09-16 DIAGNOSIS — R5383 Other fatigue: Secondary | ICD-10-CM

## 2017-09-16 DIAGNOSIS — R109 Unspecified abdominal pain: Secondary | ICD-10-CM

## 2017-09-16 DIAGNOSIS — M479 Spondylosis, unspecified: Secondary | ICD-10-CM

## 2017-09-16 DIAGNOSIS — D509 Iron deficiency anemia, unspecified: Secondary | ICD-10-CM

## 2017-09-16 DIAGNOSIS — R1310 Dysphagia, unspecified: Secondary | ICD-10-CM

## 2017-09-16 DIAGNOSIS — Z79899 Other long term (current) drug therapy: Secondary | ICD-10-CM

## 2017-09-16 DIAGNOSIS — Z903 Acquired absence of stomach [part of]: Secondary | ICD-10-CM

## 2017-09-16 MED ORDER — COLESEVELAM HCL 625 MG PO TABS
6 refills
Start: 2017-09-16 — End: ?

## 2017-09-16 MED ORDER — ONDANSETRON 4 MG PO TBDP
3 refills
Start: 2017-09-16 — End: ?

## 2017-09-16 MED ORDER — CYANOCOBALAMIN 1000 MCG/ML IJ SOLN
6 refills
Start: 2017-09-16 — End: ?

## 2017-09-16 NOTE — Progress Notes
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 She also had a J-tube placed for feeding purposes as well.  Since I saw her last, patient underwent diagnostic laparoscopic conversion to open laparotomy, total gastrectomy with en bloc left lateral lobe of the liver resection with D2 lymph node dissection, feeding jejunostomy in July 30, 2017 at [facility] by Dr. Shade Flood. Patient tolerated the procedure well although developed leaking of the gastroesophagitis to ecchymosis and stayed in the hospital for 16 days.  Patient is here for follow-up, she has been doing much better able to take full liquid, she continued to use jejunostomy for feeding. She had a CT scan of the abdomen pelvis done recently showed no evidence of disease. Patient has been seen by oncologist from [facility] Dr. Genia Harold were advised her to take another 5 more treatment complete the planned 12 treatments. 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 Patient denies any fever or chills cough chest pain or shortness of breath she is gaining weight she has been doing remarkably well       Past Surgical History:   Past Surgical History:   Procedure Laterality Date   ? BREAST RECONSTRUCTION  2016   ? CATARACT EXTRACTION W/ INTRAOCULAR LENS IMPLANT     ? COLONOSCOPY W/ BIOPSY  01/28/2017   ? EGD W/ BIOPSY  01/28/2017   ? EUS UPPER W/ FNA  02/08/2017   ? HYSTERECTOMY  2003    uterine fibroids   ? LASIK     ? MASTECTOMY  2011   Diagnostic laparoscopy and laparoscopic feeding jejunostomy tube placement 03/13/2017  07/30/2017, laparotomy, total gastrectomy with en bloc left lateral lobe

## 2017-09-16 NOTE — Progress Notes
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  Patient is here complains of increasing nausea and vomiting, not able to keep anything down., She is having obstructive symptoms., She had fluids yesterday as well. Denies any fever or chills or cough or chest pain feeling very tired and fatigued lost 2-3 pounds last few days, I have discussed with Dr. 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 Patient denies any fever or chills cough chest pain or shortness of breath. Eating only very minimal using the J-tube. 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

## 2017-09-16 NOTE — Progress Notes
she preferred to transfer her treatment to this facility and also to the [place] and she has seen Dr. Freddrick March also as of yesterday, who recommended an MRI of the liver as well as neoadjuvant chemotherapy. I do not have results of her PET/CT scan but the patient told me verbally that this was negative for liver lesions, initially described at a recent CT scan of the upper and lower abdomen. There was no other distant metastasis but there was lymphadenopathy associated with the stomach cancer. The patient is scheduled to undergo a Mediport placement by Dr. Dara Lords as of Friday 02/22/2017. She was suggested to have FOLFOX chemotherapy as soon as possible.  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 Patient couldn't tolerate tamoxifen, which has been discontinued. Patient also BRCA 1 and 2 negative in the past  Patient is here for the first course of chemotherapy with FOLFOX. She has many questions about her disease process as well as the biopsy results and also MRI results. I have discussed this in length with the patient and felt that currently she is only as stage III disease by MRI. And as per Dr. Freddrick March, surgeons recommendation I will proceed with FOLFOX chemotherapy at this time. Patient still has mild dysphagia but able to bring labs of protein supplements. Denies any fever or chills cough or chest pain or shortness of breath. I left a message with Dr. Robbie Lis, oncologist from [facility]

## 2017-09-16 NOTE — Progress Notes
EUS, HER-2/neu negative patient also had a J-tube placed for feeding purposes due to persistent nausea and vomiting,  Patient is here for follow-up, she has completed 2 courses of chemotherapy and tolerated it reasonably well although continued to have nausea. She has been using jejunostomy tube and tolerating with some eval. Although she is having some dehydration and Gave extra fluid last week. 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 Patient is been able to swallow better although continued to use jejunostomy tube for feeding. She has gained weight, feeling much better although continued Fatigue and tiredness. She has been seen at [facility] , but she would like to get second opinion from [facility] which will be next week. Patient has not decided who she wants to have surgery done although I strongly proposed that my experience with Dr. Freddrick March is remarkable that Dr. Freddrick March does minimal invasive surgery extremely well and has more experience than anybody in town. She would like to get an opinion from [facility] which she already arranged for next week. Since patient had such a good response I recommend to continue

## 2017-09-16 NOTE — Progress Notes
also completed postoperative chemotherapy. [name] is able to talk to my patient Mariah Cameron extensively and decided to proceed with surgery next month and [facility]. I also advised her to keep the appointment for the PET scan next couple weeks and I plan to see her back again in 3-4 weeks with a CBC CMP for follow-up. I spent more than 45 minutes including 35 minutes counseling time and answered all her questions to her satisfaction     August 26, 2017   Although patient had R0 resection, she did have local extension to the liver which has been resected, 10 out of 16 lymph node positive, she is in high risk for recurrence and metastasis. 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 Genia Harold regarding this matter and advised to make an appointment in next 2 weeks with Dr. Genia Harold. Hopefully her postoperative leak will be resolved so on and able to start taking oral nourishment. I had long discussion with the patient about weight is options and also her prognosis would be better due to surgery, although long-term survival is not conceivable . Patient no stable understanding and achy to obtain opinion from Dr. Genia Harold and keep her appointment with her surgeon as well. I plan to see her back again 3 weeks with CBC CMP CEA, iron studies for follow-up. I spent more than 45 minutes including 35 minutes counseling time, also reviewed the medical records from [facility] including pathology report. And answered all her questions to her satisfaction     September 16, 2017  Patient is been able to tolerate clear liquids without any problem, continue to use jejunostomy tube for feeding but she is getting stronger has mild iron deficiency anemia which is resolving. As per Dr. Genia Harold I will

## 2017-09-16 NOTE — Progress Notes
.   Advised to use the jejunostomy tube frequently also flush it with water to prevent dehydration . The patient wants to she will try to drink soups as well. . I plan to see her back again in 2-3 weeks for follow-up with CBC and CMP. After 6 treatments I plan to repeat a PET CT scan for follow-up I spent more than 40 minutes including 30 minutes counseling time and answered all their questions to their satisfaction     May 06, 2017  I am very happy to see that PET/CT scan showed complete remission without any evidence of malignancy at this time. I will continue with chemo therapy preferably total of 8 treatment before undergoing surgery. After surgery will be difficult to provide adjuvant chemotherapy due to poor tolerance. But I will wait for her visit with [facility] and see what the surgeons offered there, if they want to  do the surgery after the 6 treatment, as planned before that will be okay too, then I would consider providing further adjuvant chemotherapy after the surgery even though difficult to administer due to poor tolerance. I plan to see her back again in 4 weeks with CBC CMP CEA for follow-up and also for further discussion and hopefully, she make up her mind about who should be the surgeon. I reviewed the labs and elected to continue with chemo therapy.  I spent more than 40 minutes including 30 minutes counseling time and answered all her questions to her satisfaction  May 23, 2017  Patient had good experience in [facility] and able to see oncologist Dr. Benjie Karvonen and surgeon. Patient decided to have her surgery done in [facility] which will be end of October. As preparation request I am holding one treatment this week and she will back again chemo therapy after holiday. Plan remain the same to give a course of chemotherapy and will have 4 weeks break before undergoing surgery. Patient tells me that Dr. Benjie Karvonen suggested to give him 12 treatments before the surgery but when I talked

## 2017-09-16 NOTE — Progress Notes
a wellness check in March 2018 and this was completely negative. The patient also has history of breast cancer in 2010 and she is status post right mastectomy with breast reconstruction. She did not receive chemotherapy but she was treated with Herceptin for 50 months by an oncologist in [place]. According to her report she could not tolerate tamoxifen. In 2016 she had an episode of vertigo and CT scan of the head showed dural thickness.    The patient has never noticed blood in the stool or in the urine, she never had the yellow jaundice, she never had hepatitis or blood transfusions. She has lost approximately 20 pounds since December 2017. She has difficulties with nutrition especially with solid food, as she has to cut the food in small pieces and chew for long time and she always gets pain postprandially.    The patient used to be a smoker for 10 years but she quit in 1989 and she smoked approximately one pack per day. She used to drink wine.    Regarding her family history her father has dementia and heart disease. Her mother had breast cancer at the age of 14 her great-grandmother also had breast cancer. There is no ovarian cancer in the family. Her father has history of prostate cancer and colon cancer. Her paternal grandmother had also colon cancer.    03/04/2017    Ms. 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 The patient started having severe acid reflux syndrome in November 2017 and she ended up having an endoscopy on 01/28/2017 with also endoscopic ultrasound on 02/08/2017. Results of the pathology by biopsy of a fungating mass were discussed as above. According to the patient's report the tumor was HER-2 negative. She was initially evaluated by medical oncologist at [facility] but

## 2017-09-16 NOTE — Progress Notes
?    Senna 8.8 MG/5ML PO Syrup, Take 17.6 mg by mouth., Disp: , Rfl:       Labs:   Office Visit on 08/26/2017   Component Date Value   ? WHITE BLOOD CELL COUNT 09/09/2017 6.9    ? RBC 09/09/2017 3.83    ? Hemoglobin 09/09/2017 10.6*   ? Hematocrit 09/09/2017 32.4*   ? MCV 09/09/2017 84.6    ? [place] 09/09/2017 27.7    ? MCHC 09/09/2017 32.7    ? RDW 09/09/2017 13.6    ? Platelets 09/09/2017 252    ? MPV 09/09/2017 11.1    ? Neutrophils Absolute 09/09/2017 3685    ? Lymphocytes Absolute 09/09/2017 2408    ? Monocytes Absolute 09/09/2017 469    ? Eosinophils Absolute 09/09/2017 290    ? Basophils Absolute 09/09/2017 48    ? Neutrophils 09/09/2017 53.4    ? LYMPHOCYTES 09/09/2017 34.9    ? MONOCYTES 09/09/2017 6.8    ? EOSINOPHILS 09/09/2017 4.2    ? BASOPHILS 09/09/2017 0.7    ? Glucose 09/09/2017 103*   ? Urea Nitrogen 09/09/2017 14    ? Creatinine 09/09/2017 0.63    ? EGFR 09/09/2017 98    ? Glom Filt Rate, Est Afri* 09/09/2017 114    ? BUN/Creatinine Ratio 76/54/6503 NOT APPLICABLE    ? Sodium 09/09/2017 142    ? Potassium 09/09/2017 4.2    ? Chloride 09/09/2017 105    ? CARBON DIOXIDE 09/09/2017 27    ? Calcium 09/09/2017 9.3    ? Protein, Total 09/09/2017 6.3    ? ALBUMIN 09/09/2017 4.1    ? Globulin 09/09/2017 2.2    ? ALBUMIN/GLOBULIN RATIO 09/09/2017 1.9    ? Total Bilirubin 09/09/2017 0.4    ? Alkaline Phosphatase 09/09/2017 94    ? AST 09/09/2017 23    ? ALT 09/09/2017 25    ? LD 09/09/2017 133    ? Ferritin 09/09/2017 12    ? Iron 09/09/2017 27*   ? TIBC 09/09/2017 448    ? Iron Saturation 09/09/2017 6*   ? CEA 09/09/2017 0.8    Orders Only on 08/19/2017   Component Date Value   ? TSH, 3RD GENERATION 08/20/2017 3.02    ? Free T4 08/20/2017 1.2    ? T3 Free 08/20/2017 3.0      02/19/2017 CBC, CMP grossly normal, CEA 1.5, LDH 124  03/19/2017 CBC grossly normal except hemoglobin 11.5 CMP within normal limits, reviewed with the patient today March 19, 2017

## 2017-09-16 NOTE — Progress Notes
of the liver resection with D2 lymph node dissection, feeding jejunostomy    Social History:    Social History     Social History   ? Marital status: Single     Spouse name: N/A   ? Number of children: 0   ? Years of education: N/A     Occupational History   ? Not on file.     Social History Main Topics   ? Smoking status: Former Smoker     Packs/day: 1.00     Years: 15.00     Types: Cigarettes     Start date: 06/24/1974     Quit date: 11/29/1988   ? Smokeless tobacco: Never Used   ? Alcohol use Yes   ? Drug use: Yes     Frequency: 7.0 times per week     Types: Marijuana   ? Sexual activity: Not on file     Other Topics Concern   ? Not on file     Social History Narrative   ? No narrative on file       Family History:    Family History   Problem Relation Age of Onset   ? Breast Cancer Mother    ? Prostate Cancer Father    ? Colon Cancer Father        Vital Signs:   Vitals:    09/16/17 1152   BP: 97/64   Pulse: 70   Resp: 16   Temp: 36.8 ?C (98.3 ?F)   Weight: 60.3 kg (133 lb)   Height: 1.702 m (5\' 7" )     Vitals:    09/16/17 1152   BP: 97/64   Pulse: 70   Resp: 16   Temp: 36.8 ?C (98.3 ?F)   Weight: 60.3 kg (133 lb)   Height: 1.702 m (5\' 7" )       Allergies: Patient has no known allergies.    Review of Systems:    Constitutional:No more weight loss, mild fatigue, No fever, able to tolerate clear liquids  Eyes: No double vision  Ears,Nose, Mouth,Throat: No sinus trouble, No sore throat  Cardiac: No lightheadedness, No swelling in legs, No chest pain  Respiratory: No cough, No hemoptysis, No shortness of breath  Gastrointestinal:  Mild nausea, no vomiting No diarrhea, status post laparotomy,, jejunostomy tube in place  Genitourinary: No burning on urination  Musculosketal: No bone pain.   Skin: No skin rash  Neurological: No seizures, No loss of balance, No weakness of limbs, mild numbness of both feet and hands getting slowly worse  Psychiatric: Deferred

## 2017-09-16 NOTE — Progress Notes
The patient will return to this clinic for follow-up, most probable with Dr. Lurena Joiner, in 2 weeks after her first round of chemotherapy to assess for toxicity and possible side effects.    The patient and her sister had a long list of questions regarding diagnosis, treatment, sequencing of multi modality treatments, side effects of chemotherapy and this questions were answered to the best of my knowledge during this initial evaluation.    It is of note is that the knee CT scan of the chest abdomen and pelvis from Feb 05, 2017 to indeterminate subcentimeter low-density lesions were noted within the lateral segment of the left hepatic lobe. These were biopsied by fine-needle aspiration at [facility] and were negative for malignancy. Most recent PET CT scan at Dr. Dario Guardian facility did not confirm liver metastasis.    With today's evaluation also I have requested baseline laboratory testing to include a CBC, CMP, LDH, CEA.  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 If she does have a good response she'll be a candidate for curative surgery. Also extensively discussed with him about the toxicities of chemo therapy which may include mild nausea vomiting alopecia myelosuppression, oxaliplatin-induced neuropathy, excessive sensitivity to cold temperature as well as occasional mucositis from 5-fluorouracil. Patient and her sister voiced her good understanding and are anxious to proceed with chemo therapy. I plan to see her back again in 10 days CBC CMP for follow-up  I also left the message with Dr. Robbie Lis to see whether he has sent specimen for [facility] molecular analysis if not I will send to [facility] for the same analysis  I spent more than 40 minutes including 30 minutes counseling time and

## 2017-09-16 NOTE — Progress Notes
treatment with FOLFOX and tolerated very well except mild numbness of hands and feet lasting for few days. She is gaining weight continue to use J-tube and also able to eat regular food. Patient is been scheduled to have surgery and of October. Plan is to complete chemotherapy 4 weeks before the surgery. I have discussed the case with Dr. Genia Harold from [facility] who agree about giving total 8 courses of FOLFOX before surgery and then depending on pathology, we may consider postoperative chemotherapy if she has persistent tumor. 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 She also had a J-tube placed for feeding purposes as well.  , Patient has completed 7 treatment with FOLFOX chemotherapy and continued to be in excellent remission. She has been scheduled Surgery at [facility] and of October. She started having some numbness in the hands and feet and also had difficulty recovering from the last chemotherapy she would like to skip day 8 treatment in preparation for the surgery I believe it is reasonable to do that so that she will be in a better shape for the proposed total gastrectomy. Patient does have some reservations about the surgery after talking few people at surgery and had problems. I have her talk to one of my patient Ms. Covie who had the same surgical procedure done by Dr. Freddrick March last year and recovered very well and back to work now. Mr. Maines talk to Mrs. Covie  for a while and decided to

## 2017-09-16 NOTE — Progress Notes
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 [10/ 16 ] with extracapsular extension, surgical margins are negative for carcinoma    Imaging Results:     02/14/2017, PET/CT scan showed FDG uptake in the anterior wall of the antrum consistent with malignancy, 1.87 with a lymph node within the stomach and liver, no obvious liver metastasis  02/21/2017, MRI of the abdomen there is 4-5 mm nonenhancing lesion noted within the left lobe of the liver which were present and atypical cyst or small hemangioma no obvious additional lesions are seen within the liver, there is a mass within the stomach consistent with patient's history of stomach cancer  I have reviewed these reports with the patient today March 04, 2017  EUS, staged as T3 N3, M1 although biopsy of the liver negative for malignancy as his MRI and the PET scan  04/25/2017 PET CT scan showed no evidence of residual or metastatic gastric cancer, no evidence of other malignancy, reviewed with the patient today 05/06/2017  09/12/2017 CT scan of the abdomen and pelvis with contrast, showed status post total gastrectomy no CT evidence of intra-abdominal or intrapelvic prostatic disease reviewed with the patient today and her friend September 16, 2017      Physical Exam:   Physical examination  Patient is alert, not in any acute distress , tired and fatigue, able to tolerate clear liquids , ECOG performance status?0,   HEENT, no pallor nor icterus, throat clear is a petechia  Neck, no lymphadenopathy, no thyromegaly, no lymphadenopathy  Respiratory system, bilateral mild wheezing no rhonchi or vascular  Cardiac, S1-S2 regular  Abdomen, soft nontender no hepatosplenomegaly, bowel sounds are active, no other mass palpable, laparotomy  hea incisionled well,, feeding jejunostomy in place  Musculoskeletal, no joint pain, no muscle pain, mild numbness of the feet and hands which is slowly getting worse

## 2017-09-16 NOTE — Progress Notes
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 The patient has a Mediport placed as of this Friday, 02/22/2017. I discussed today with her patient and her sister regarding her diagnosis, prognosis and treatments for this type of tumor. I explained in great detail the effects and side effects of chemotherapy especially neuropathy with oxaliplatin possible chest pain or discomfort with 5-FU, less of alopecia and no major other side effects. In general this tablet treatment is relatively well tolerated without excessive cytopenias. I prescribed antinausea medications in case of chemotherapy associated nausea and vomiting, to her local pharmacy. We did add toward with the patient and the sister of our chemotherapy unit and introduce her to our chemotherapy nurses. I have prescribed chemotherapy to start tentatively on Wednesday, Feb 27, 2017., Achieved complete remission by PET CT scan 04/25/2017    Given her heavy family history of breast cancer, colon cancer, prostate cancer, I have requested a genetic evaluation testing by myriad laboratories. At [facility] Foundation testing was requested and results need to be followed for molecular biology of the tumor to include MSI status or other clinically relevant molecular markers.

## 2017-09-16 NOTE — Progress Notes
answered all her questions to their satisfaction  March 07, 2017  This patient has persistent nausea vomiting and also has some obstructive symptoms from GE junction tumor, I have discussed the case with Dr. Freddrick March was suggested to have a jejunostomy tube placed for nutritional purposes. Dr. Freddrick March also talked to the patient to phone regarding the need of jejunostomy tube. Patient understand the situation and she is willing to proceed with the jejunostomy tube. Dr. Vilinda Blanks will make appropriate arrangements. I will give IV fluid today and tomorrow and advised to drink protein drinks as much she can. I also twice her that in today to have nausea vomiting she may need to go to the emergency room in May needs to be admitted to the hospital for jejunostomy tube and IV fluids. I plan to see her back again next week as planned before, I spent more than 35 minutes including 25 minutes counseling time and also discuss with Dr. Freddrick March    March 19, 2017  I'm happy to see that patient tolerated laparoscopic jejunostomy tube placement and currently tolerating the tube feeding very well. I have reviewed the labs and will proceed with second course of chemotherapy with FOLFOX at this time. I also used emend to prevent nausea so so with chemo therapy. I'm happy to see that patient has no involvement of the omentum or the peritoneum. I still believe she'll be a candidate for curative surgery if she has lost want to chemotherapy well. I will continue FOLFOX chemotherapy every 2 weeks and see her back again in 4 weeks with CBC CMP for follow-up. I spent more than 40 minutes including 30 minutes counseling time and answered all their questions to their satisfaction also reviewed the recent hospitalization for surgery as well    April 09, 2017   Labs were reviewed today and elected to continue with chemotherapy next week she will receive the day 15 of course 2 of chemotherapy with FOLFOX.

## 2017-09-16 NOTE — Progress Notes
04/02/2017 CBC grossly normal except hemoglobin 11.0 CMP grossly normal, reviewed with the patient and her sister today April 08, 2017 ,   05/01/2017 CBC grossly normal except hemoglobin 10.7, platelet 10/30/1998, CMP grossly normal except albumin 3.3, AST 103, reviewed with the patient today May 06, 2017  06/04/2017, CBC grossly normal except hemoglobin 10.1, CMP not available, B12 2000, reviewed with the patient today June 06, 2017  06/18/2017 CBC grossly normal except hemoglobin 10.8, CMP within normal limits reviewed with the patient and her friend today June 27, 2014  08/20/2017 CBC grossly normal except hemoglobin 11.0, CMP within normal limit except alkaline phosphatase 166, T3-T4 TSH, normal reviewed with the patient today August 26, 2017  09/09/2017 CBC grossly normal except hemoglobin 10.6, CMP within normal limit, LDH 133, iron studies showed iron saturation 6%, ferritin 12, CEA 0.8, T3-T4 TSH normal, reviewed with the patient today September 16, 2017    5/4/2018A. 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 SEE COMMENT    B. ANTRUM (ENDOSCOPIC BIOPSY):   -  MILD CHRONIC GASTRITIS WITH MINIMAL ACTIVITY.   -  NO HELICOBACTER PYLORI IDENTIFIED BY MORPHOLOGICAL EVALUATION.   -  NO INTESTINAL METAPLASIA, DYSPLASIA OR MALIGNANCY IDENTIFIED.    C. POLYP DESCENDING (ENDOSCOPIC BIOPSY):   -  TUBULAR ADENOMA   -  NEGATIVE FOR HIGH-GRADE DYSPLASIA AND MALIGNANCY  HER2 Non-Breast: NEGATIVE  Score: 1+    02/08/2017. LIVER (FINE NEEDLE ASPIRATION):   -  NO EVIDENCE OF MALIGNANCY IDENTIFIED.    -  SMALL DETACHED FRAGMENTS OF LIVER PARENCHYMA AND SUPERFICIAL GASTRIC        FOVEOLAR EPITHELIUM.    07/30/2017 total gastrectomy/ en bloc left lateral lobe liver resection with D2 lymph node dissection  Residual adenocarcinoma located in the gastric cardia and body with involvement of the gastroesophageal junction and direct invasion of the

## 2017-09-16 NOTE — Progress Notes
for a total of 8 treatment before undergoing surgery since after surgery it would be difficult to provide adjuvant chemo therapy.   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 She also had a J-tube placed for feeding purposes as well.  Patient's here for a follow-up regarding her cancer as well as chemo therapy. She was seen in [facility] by surgeon as well as oncologists Dr. Benjie Karvonen who agreed with current plan of care including completion of 8 treatment with FOLFOX followed by surgery. Patient would like to have surgery done in [facility] and has been scheduled for end of October. Patient is been feeling much better now able to swallow and keeping it down continues to use J-tube for nutritional purposes. She has been feeling a bit tired and fatigued but much better than before start having some numbness and tingling in hands and feet. 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 She also had a J-tube placed for feeding purposes as well.  Patient is here for follow-up as well as for chemotherapy. She completed 6

## 2017-09-16 NOTE — Progress Notes
give 5 more treatment with FOLFOX chemotherapy without the oxaliplatin to prevent further neuropathy from oxaliplatin. I have discussion with Dr. Genia Harold who also felt that patient might benefit with postoperative chemo therapy and agree to hold off oxaliplatin due to neuropathy which certainly will get worse with more oxaliplatin. Dr. Genia Harold does not suggest radiation therapy at this point especially with her involvement of the liver although was resected. I had long discussion with the patient and her friend about the need of further chemotherapy and per patient request I will hold off oxaliplatin and will give 5-fluorouracil/leucovorin combination chemo therapy. Patient voiced it would understanding and agree to proceed with. I plan to see her back again 2 weeks after the first treatment. I spent more than 45 minutes including 35 minutes of counseling time answered all their questions to their satisfaction and also discussed with Dr. Genia Harold as well     For rest of her medical problem and continue with current management and follow with her primary physician. I have encouraged her to call me directly if she has any questions or if I can be of any further assistance. Results of most recent studies discussed with patient. All questions were answered to patient's satisfaction. Patient agreeable to the above plan. More than 50% of that time was spent on coordination of care, counseling, further diagnostic and therapeutic clinic and follow-up care. This document was created using voice recognition software. Inadvertent typographical errors, word omissions and/or word substitutions may exist.         Return to Clinic:  in 3 weeks.  fax  Dr. Rosaura Carpenter,  Dr. Tamsen Meek [place]  .

## 2017-09-16 NOTE — Progress Notes
Hematologic/Lymphatic/Immunologic: No bleeding, No lumps in arm pits  September 16 2017, 12 system review done as above    Medications:   Current Outpatient Prescriptions:   ?  ALPRAZolam (XANAX) 0.5 MG PO Tablet, TK 1 T PO QHS, Disp: , Rfl: 2  ?  amoxicillin-clavulanate (AUGMENTIN) 400-57 MG PO Tablet Chewable, Chew 1 tablet 2 times daily for 1 day., Disp: 2 tablet, Rfl: 0  ?  amoxicillin-clavulanate (AUGMENTIN) 875-125 MG PO Tablet, every 12 hours., Disp: , Rfl: 0  ?  ARMOUR THYROID 15 MG PO Tablet, TK 3 TS PO ONCE D OES, Disp: , Rfl: 3  ?  CARAFATE 1 GM/10ML PO Suspension, TK 10 ML PO TID OES 1 HOUR B MEALS AND HS, Disp: , Rfl: 2  ?  colesevelam (WELCHOL) 625 MG PO Tablet, , Disp: , Rfl: 6  ?  cyanocobalamin (vitamin B12) 1000 MCG/ML IJ Solution, INJ 1 ML  ONCE A MONTH, Disp: , Rfl: 6  ?  diclofenac (VOLTAREN GEL) 1 % TD Gel, APP AA BID UTD, Disp: , Rfl: 0  ?  DILAUDID 1 MG/ML PO Liquid, , Disp: , Rfl: 0  ?  eszopiclone (LUNESTA) 3 MG PO Tablet, , Disp: , Rfl: 2  ?  HYDROcodone-acetaminophen (NORCO) 10-325 MG PO Tablet, TK 1 T PO Q 6 H PRN, Disp: , Rfl: 0  ?  HYDROcodone-acetaminophen (NORCO) 5-325 MG PO Tablet, TK 1-2 TS PO Q 6 H, Disp: , Rfl: 0  ?  ibuprofen (ADVIL,MOTRIN) 600 MG PO Tablet, Take 1 tablet by mouth 3 times daily for 5 days., Disp: 15 tablet, Rfl: 0  ?  levoFLOXacin (LEVAQUIN) 500 MG PO Tablet, Take 1 tablet by mouth daily for 7 days., Disp: 7 tablet, Rfl: 0  ?  lidocaine-prilocaine (EMLA) 2.5-2.5 % EX Cream, Apply 30 minutes prior to treatment, Disp: 60 g, Rfl: 1  ?  medical marijuana, Take 600 mg by mouth., Disp: , Rfl:   ?  omeprazole (PriLOSEC) 40 MG PO Capsule Delayed Release, TK 1 C PO QD AC, Disp: , Rfl: 5  ?  ondansetron (ZOFRAN-ODT) 4 MG PO Tablet Disintegrating, , Disp: , Rfl: 3  ?  prochlorperazine (COMPAZINE) 10 MG PO Tablet, Take 1 tablet by mouth every 6 hours as needed., Disp: 60 tablet, Rfl: 11  ?  QUEtiapine (SEROquel) 50 MG PO Tablet, , Disp: , Rfl: 0

## 2017-09-16 NOTE — Progress Notes
proceed with surgery next month as planned. She also scheduled to have a PET CT scan next week. 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 She also had a J-tube placed for feeding purposes as well.  Since I saw her last, patient underwent diagnostic laparoscopic conversion to open laparotomy, total gastrectomy with en bloc left lateral lobe of the liver resection with D2 lymph node dissection, feeding jejunostomy in July 30, 2017 at [facility] by Dr. Shade Flood. Patient tolerated the procedure well although developed leaking of the gastroesophagitis to ecchymosis and stayed in the hospital for 16 days. She is records from that and currently using G-tube feeding. She has an appointment to have upper GI done again to see whether there is any more leakage. Patient continued to be nothing by mouth. Path report did show local infiltration to the liver which has been dissected and 10 of 16 lymph nodes were positive with extracapsular extension  Patient is doing better although feeling little bit tired and fatigue, continued to have mild abdominal discomfort, using the G-tube for feeding, she lost a few pounds but looking good. Denies any fever chills cough chest pain or shortness of breath, no headache or dizziness no bleeding problems  Interim medical history and surgical history were reviewed as above

## 2017-09-16 NOTE — Progress Notes
to Dr. Benjie Karvonen today  his recommendation remain the same to have the surgery after the 8 treatment and following surgery if feasible will give 4 more course of chemo therapy. I plan to see her back again in 2 weeks for follow-up as preparation request. I spent more than 35 minutes including 35 minutes counseling time answered all her questions to her satisfaction. I also discuss with Dr.Mody from [facility] as well  June 06, 2017  As per my previous discussion with the patient and Dr. Genia Harold from [facility] it was decided to complete 8 treatment with FOLFOX which will complete in October 4 and surgery has been scheduled end of October and [facility] as per patient request. Patient already seen the surgeon and surgery schedule has been made. I will continue with chemo therapy every 2 weeks and as I mentioned it will be finished by October 4. I advised her to take a multivitamin with iron supplement once daily to improve the anemia. Encouraged to eat better and gain stream or weight before the surgery after the surgery she might lose a few pounds. I plan to see her back again 4 weeks with CBC CMP and LDH for follow-up. Patient also scheduled PET/CT scan in couple weeks. I spent more than 35 minutes including 25 minutes counseling time and answered all her questions to her satisfaction    June 27, 2017   Since patient already started developing peripheral neuropathy had completed 7 treatments and also scheduled for total gastrectomy in 4 weeks, I believe it is reasonable to hold the 8 treatment next week as requested by the patient so that she can physically getting ready for the surgery. I had a long discussion with the patient about the need of surgery for curative intent. And also made available to her to discuss surgery in postoperative complications from any with one of my patient who had synchronous surgery almost a year ago and recovered very quickly and

## 2017-09-16 NOTE — Progress Notes
Assunta Gambles, MD  [address]  [place] [address]  [facility] [place] [address]    Chief Complaint   Patient presents with   ? Follow-up     Gastric adenocarcinoma   The patient has been referred to this clinic for workup and evaluation and treatment of adenocarcinoma of the GE junction.    Patient Name: Mariah Cameron      Date Of Birth: 12-Aug-1956    History of Present Illness:      Mariah Cameron is a very pleasant 61 year old Caucasian female, who is visiting today with her sister, referred to this clinic with histological diagnosis by endoscopy of moderately to poorly differentiated adenocarcinoma of the GE junction extending into the lesser curvature of the stomach. The patient started having severe acid reflux syndrome in November 2017 and she ended up having an endoscopy on 01/28/2017 with also endoscopic ultrasound on 02/08/2017. Results of the pathology by biopsy of a fungating mass were discussed as above. According to the patient's report the tumor was HER-2 negative. She was initially evaluated by medical oncologist at [facility] [place] but she preferred to transfer her treatment to this facility and also to the [place] and she has seen Dr. Freddrick March also as of yesterday, who recommended an MRI of the liver as well as neoadjuvant chemotherapy. I do not have results of her PET/CT scan but the patient told me verbally that this was negative for liver lesions, initially described at a recent CT scan of the upper and lower abdomen. There was no other distant metastasis but there was lymphadenopathy associated with the stomach cancer. The patient is scheduled to undergo a Mediport placement by Dr. Dara Lords as of Friday 02/22/2017. She was suggested to have FOLFOX chemotherapy as soon as possible.    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 She had

## 2017-09-30 MED ORDER — FLUOROURACIL INFUSION AMB PUMP BCN JX
2400 mg/m2 | Freq: Once | INTRAVENOUS | Status: CN
Start: 2017-09-30 — End: ?

## 2017-09-30 MED ORDER — FLUOROURACIL IV SYRINGE BCN JX
400 mg/m2 | Freq: Once | INTRAVENOUS | Status: CN
Start: 2017-09-30 — End: ?

## 2017-09-30 MED ORDER — LEUCOVORIN IVPB JX
400 mg/m2 | Freq: Once | INTRAVENOUS | Status: CN
Start: 2017-09-30 — End: ?

## 2017-10-02 ENCOUNTER — Inpatient Hospital Stay: Admit: 2017-10-02 | Discharge: 2017-10-03 | Primary: Internal Medicine

## 2017-10-02 DIAGNOSIS — C160 Malignant neoplasm of cardia: Secondary | ICD-10-CM

## 2017-10-02 DIAGNOSIS — Z5111 Encounter for antineoplastic chemotherapy: Principal | ICD-10-CM

## 2017-10-02 DIAGNOSIS — C169 Malignant neoplasm of stomach, unspecified: Secondary | ICD-10-CM

## 2017-10-02 MED ORDER — FLUOROURACIL INFUSION AMB PUMP BCN JX
2400 mg/m2 | Freq: Once | INTRAVENOUS | Status: DC
Start: 2017-10-02 — End: 2017-10-03

## 2017-10-02 MED ORDER — LORAZEPAM 2 MG/ML IJ SOLN
1 mg | Freq: Once | INTRAVENOUS | Status: CP
Start: 2017-10-02 — End: ?

## 2017-10-02 MED ORDER — LEUCOVORIN IVPB JX
400 mg/m2 | Freq: Once | INTRAVENOUS | Status: DC
Start: 2017-10-02 — End: 2017-10-02

## 2017-10-02 MED ORDER — LEUCOVORIN IVPB JX
400 mg/m2 | Freq: Once | INTRAVENOUS | Status: CP
Start: 2017-10-02 — End: ?

## 2017-10-02 MED ORDER — DEXTROSE 5 % IV SOLN
Freq: Once | INTRAVENOUS | Status: DC
Start: 2017-10-02 — End: 2017-10-02

## 2017-10-02 MED ORDER — FLUOROURACIL IV SYRINGE BCN JX
400 mg/m2 | Freq: Once | INTRAVENOUS | Status: CP
Start: 2017-10-02 — End: ?

## 2017-10-02 MED ORDER — FOSAPREPITANT IVPB JX
150 mg | Freq: Once | INTRAVENOUS | Status: CP
Start: 2017-10-02 — End: ?

## 2017-10-02 MED ORDER — SODIUM CHLORIDE 0.9 % IV SOLN
Freq: Once | INTRAVENOUS | Status: DC
Start: 2017-10-02 — End: 2017-10-03

## 2017-10-02 MED ORDER — ONDANSETRON WITH DEXAMETHASONE IVPB (NON-SPECIFIED)
Freq: Once | INTRAVENOUS | Status: CP
Start: 2017-10-02 — End: ?

## 2017-10-02 NOTE — Addendum Note
Addended by: Mickle Plumb D on: 10/02/2017 09:28 AM     Modules accepted: Orders

## 2017-10-04 ENCOUNTER — Inpatient Hospital Stay: Admit: 2017-10-04 | Discharge: 2017-10-05 | Primary: Internal Medicine

## 2017-10-04 DIAGNOSIS — Z803 Family history of malignant neoplasm of breast: Secondary | ICD-10-CM

## 2017-10-04 DIAGNOSIS — C169 Malignant neoplasm of stomach, unspecified: Principal | ICD-10-CM

## 2017-10-04 DIAGNOSIS — Z8042 Family history of malignant neoplasm of prostate: Secondary | ICD-10-CM

## 2017-10-04 DIAGNOSIS — Z87891 Personal history of nicotine dependence: Secondary | ICD-10-CM

## 2017-10-04 DIAGNOSIS — Z800 Family history of malignant neoplasm of digestive organs: Secondary | ICD-10-CM

## 2017-10-04 DIAGNOSIS — C159 Malignant neoplasm of esophagus, unspecified: Secondary | ICD-10-CM

## 2017-10-04 DIAGNOSIS — Z8501 Personal history of malignant neoplasm of esophagus: Secondary | ICD-10-CM

## 2017-10-04 MED ORDER — HEPARIN SODIUM LOCK FLUSH 100 UNIT/ML IV SOLN
500 [IU] | Status: DC | PRN
Start: 2017-10-04 — End: 2017-10-05

## 2017-10-04 MED ORDER — SODIUM CHLORIDE FLUSH 0.9 % IV SOLN
10 mL | Status: DC | PRN
Start: 2017-10-04 — End: 2017-10-05

## 2017-10-07 ENCOUNTER — Inpatient Hospital Stay: Admit: 2017-10-07 | Discharge: 2017-10-08 | Primary: Internal Medicine

## 2017-10-07 DIAGNOSIS — C169 Malignant neoplasm of stomach, unspecified: Principal | ICD-10-CM

## 2017-10-14 ENCOUNTER — Ambulatory Visit: Admit: 2017-10-14 | Discharge: 2017-10-14 | Attending: Hematology & Oncology | Primary: Internal Medicine

## 2017-10-14 DIAGNOSIS — E039 Hypothyroidism, unspecified: Secondary | ICD-10-CM

## 2017-10-14 DIAGNOSIS — F419 Anxiety disorder, unspecified: Secondary | ICD-10-CM

## 2017-10-14 DIAGNOSIS — D219 Benign neoplasm of connective and other soft tissue, unspecified: Secondary | ICD-10-CM

## 2017-10-14 DIAGNOSIS — Z803 Family history of malignant neoplasm of breast: Secondary | ICD-10-CM

## 2017-10-14 DIAGNOSIS — R1310 Dysphagia, unspecified: Secondary | ICD-10-CM

## 2017-10-14 DIAGNOSIS — M1990 Unspecified osteoarthritis, unspecified site: Secondary | ICD-10-CM

## 2017-10-14 DIAGNOSIS — G8929 Other chronic pain: Secondary | ICD-10-CM

## 2017-10-14 DIAGNOSIS — Z90710 Acquired absence of both cervix and uterus: Secondary | ICD-10-CM

## 2017-10-14 DIAGNOSIS — Z8042 Family history of malignant neoplasm of prostate: Secondary | ICD-10-CM

## 2017-10-14 DIAGNOSIS — R634 Abnormal weight loss: Secondary | ICD-10-CM

## 2017-10-14 DIAGNOSIS — R1013 Epigastric pain: Secondary | ICD-10-CM

## 2017-10-14 DIAGNOSIS — Z800 Family history of malignant neoplasm of digestive organs: Secondary | ICD-10-CM

## 2017-10-14 DIAGNOSIS — Z853 Personal history of malignant neoplasm of breast: Secondary | ICD-10-CM

## 2017-10-14 DIAGNOSIS — C169 Malignant neoplasm of stomach, unspecified: Secondary | ICD-10-CM

## 2017-10-14 DIAGNOSIS — Z87891 Personal history of nicotine dependence: Secondary | ICD-10-CM

## 2017-10-14 DIAGNOSIS — C160 Malignant neoplasm of cardia: Secondary | ICD-10-CM

## 2017-10-14 DIAGNOSIS — C50911 Malignant neoplasm of unspecified site of right female breast: Secondary | ICD-10-CM

## 2017-10-14 DIAGNOSIS — Z9011 Acquired absence of right breast and nipple: Secondary | ICD-10-CM

## 2017-10-14 DIAGNOSIS — K219 Gastro-esophageal reflux disease without esophagitis: Principal | ICD-10-CM

## 2017-10-14 DIAGNOSIS — K769 Liver disease, unspecified: Secondary | ICD-10-CM

## 2017-10-14 DIAGNOSIS — Z903 Acquired absence of stomach [part of]: Secondary | ICD-10-CM

## 2017-10-14 DIAGNOSIS — M810 Age-related osteoporosis without current pathological fracture: Secondary | ICD-10-CM

## 2017-10-14 DIAGNOSIS — R9389 Abnormal findings on diagnostic imaging of other specified body structures: Secondary | ICD-10-CM

## 2017-10-14 MED ORDER — DIPHENHYDRAMINE HCL 50 MG/ML IJ SOLN
25 mg | INTRAVENOUS | Status: CN | PRN
Start: 2017-10-14 — End: ?

## 2017-10-14 MED ORDER — DISCONNECT MEDICATION INFUSION JX
1 | Freq: Once | Status: CN
Start: 2017-10-14 — End: ?

## 2017-10-14 MED ORDER — FOSAPREPITANT IVPB JX
150 mg | Freq: Once | INTRAVENOUS | Status: CN
Start: 2017-10-14 — End: ?

## 2017-10-14 MED ORDER — SODIUM CHLORIDE 0.9 % IV SOLN
Freq: Once | INTRAVENOUS | Status: CN
Start: 2017-10-14 — End: ?

## 2017-10-14 MED ORDER — CONNECT MEDICATION INFUSION JX
1 | Freq: Once | Status: CN
Start: 2017-10-14 — End: ?

## 2017-10-14 MED ORDER — PROMETHAZINE HCL 50 MG PO TABS
0 refills
Start: 2017-10-14 — End: ?

## 2017-10-14 MED ORDER — SODIUM CHLORIDE FLUSH 0.9 % IV SOLN
10 mL | Status: CN | PRN
Start: 2017-10-14 — End: ?

## 2017-10-14 MED ORDER — HEPARIN SODIUM LOCK FLUSH 100 UNIT/ML IV SOLN
500 [IU] | Status: CN | PRN
Start: 2017-10-14 — End: ?

## 2017-10-14 MED ORDER — LORAZEPAM 2 MG/ML IJ SOLN
1 mg | Freq: Once | INTRAVENOUS | Status: CN
Start: 2017-10-14 — End: ?

## 2017-10-14 MED ORDER — FLUOROURACIL INFUSION AMB PUMP BCN JX
2400 mg/m2 | Freq: Once | INTRAVENOUS | Status: CN
Start: 2017-10-14 — End: ?

## 2017-10-14 MED ORDER — MEPERIDINE HCL 25 MG/ML IJ SOLN
25 mg | INTRAVENOUS | Status: CN | PRN
Start: 2017-10-14 — End: ?

## 2017-10-14 MED ORDER — LEUCOVORIN IVPB JX
400 mg/m2 | Freq: Once | INTRAVENOUS | Status: CN
Start: 2017-10-14 — End: ?

## 2017-10-14 MED ORDER — HYDROCORTISONE NA SUCCINATE PF 100 MG IJ SOLR
100 mg | INTRAVENOUS | Status: CN | PRN
Start: 2017-10-14 — End: ?

## 2017-10-14 MED ORDER — ONDANSETRON WITH DEXAMETHASONE IVPB (NON-SPECIFIED)
Freq: Once | INTRAVENOUS | Status: CN
Start: 2017-10-14 — End: ?

## 2017-10-14 MED ORDER — FLUOROURACIL IV SYRINGE BCN JX
400 mg/m2 | Freq: Once | INTRAVENOUS | Status: CN
Start: 2017-10-14 — End: ?

## 2017-10-14 MED ORDER — DEXAMETHASONE SODIUM PHOSPHATE 20 MG/5ML IJ SOLN
20 mg | INTRAVENOUS | Status: CN | PRN
Start: 2017-10-14 — End: ?

## 2017-10-14 NOTE — Progress Notes
?   Smokeless tobacco: Never Used   ? Alcohol use Yes   ? Drug use: Yes     Frequency: 7.0 times per week     Types: Marijuana   ? Sexual activity: Not on file     Other Topics Concern   ? Not on file     Social History Narrative   ? No narrative on file       Family History:    Family History   Problem Relation Age of Onset   ? Breast Cancer Mother    ? Prostate Cancer Father    ? Colon Cancer Father        Vital Signs:   Vitals:    10/14/17 1149   BP: 95/62   Pulse: 62   Resp: 16   Temp: 36.6 ?C (97.8 ?F)   Weight: 59.6 kg (131 lb 4.8 oz)   Height: 1.702 m (5\' 7" )     Vitals:    10/14/17 1149   BP: 95/62   Pulse: 62   Resp: 16   Temp: 36.6 ?C (97.8 ?F)   Weight: 59.6 kg (131 lb 4.8 oz)   Height: 1.702 m (5\' 7" )       Allergies: Patient has no known allergies.    Review of Systems:    Constitutional:No more weight loss, mild fatigue, No fever, able to tolerate clear liquids  Eyes: No double vision  Ears,Nose, Mouth,Throat: No sinus trouble, No sore throat  Cardiac: No lightheadedness, No swelling in legs, No chest pain  Respiratory: No cough, No hemoptysis, No shortness of breath  Gastrointestinal:  Intermittent nausea, no vomiting No diarrhea, status post laparotomy,, jejunostomy tube in place  Genitourinary: No burning on urination  Musculosketal: No bone pain.   Skin: No skin rash  Neurological: No seizures, No loss of balance, No weakness of limbs, mild numbness of both feet and hands getting slowly worse  Psychiatric: Deferred  Hematologic/Lymphatic/Immunologic: No bleeding, No lumps in arm pits  October 14, 2017, 12 system review done as above    Medications:   Current Outpatient Prescriptions:   ?  ALPRAZolam (XANAX) 0.5 MG PO Tablet, TK 1 T PO QHS, Disp: , Rfl: 2  ?  amoxicillin-clavulanate (AUGMENTIN) 400-57 MG PO Tablet Chewable, Chew 1 tablet 2 times daily for 1 day., Disp: 2 tablet, Rfl: 0  ?  amoxicillin-clavulanate (AUGMENTIN) 875-125 MG PO Tablet, every 12 hours., Disp: , Rfl: 0

## 2017-10-14 NOTE — Progress Notes
from Feb 05, 2017 to indeterminate subcentimeter low-density lesions were noted within the lateral segment of the left hepatic lobe. These were biopsied by fine-needle aspiration at [facility] and were negative for malignancy. Most recent PET CT scan at Dr. Dario Guardian facility did not confirm liver metastasis.    With today's evaluation also I have requested baseline laboratory testing to include a CBC, CMP, LDH, CEA.  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 If she does have a good response she'll be a candidate for curative surgery. Also extensively discussed with him about the toxicities of chemo therapy which may include mild nausea vomiting alopecia myelosuppression, oxaliplatin-induced neuropathy, excessive sensitivity to cold temperature as well as occasional mucositis from 5-fluorouracil. Patient and her sister voiced her good understanding and are anxious to proceed with chemo therapy. I plan to see her back again in 10 days CBC CMP for follow-up  I also left the message with Dr. Robbie Lis to see whether he has sent specimen for Foundation molecular analysis if not I will send to [facility] for the same analysis  I spent more than 40 minutes including 30 minutes counseling time and answered all her questions to their satisfaction  March 07, 2017  This patient has persistent nausea vomiting and also has some obstructive symptoms from GE junction tumor, I have discussed the case with Dr. Freddrick March was suggested to have a jejunostomy tube placed for nutritional purposes. Dr. Freddrick March also talked to the patient to phone regarding the need of jejunostomy tube. Patient understand the situation and she is willing to proceed with the jejunostomy tube. Dr. Vilinda Blanks will make appropriate

## 2017-10-14 NOTE — Progress Notes
limit, LDH 133, iron studies showed iron saturation 6%, ferritin 12, CEA 0.8, T3-T4 TSH normal, reviewed with the patient today September 16, 2017  10/07/2016, CBC grossly normal except hemoglobin 10.7, CMP within normal limit, iron studies consistent with mild iron deficiency iron saturation 7%, ferritin 12, LDH 126, CEA 1.8, reviewed with the patient today in October 14, 2017        5/4/2018A. 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 SEE COMMENT    B. ANTRUM (ENDOSCOPIC BIOPSY):   -  MILD CHRONIC GASTRITIS WITH MINIMAL ACTIVITY.   -  NO HELICOBACTER PYLORI IDENTIFIED BY MORPHOLOGICAL EVALUATION.   -  NO INTESTINAL METAPLASIA, DYSPLASIA OR MALIGNANCY IDENTIFIED.    C. POLYP DESCENDING (ENDOSCOPIC BIOPSY):   -  TUBULAR ADENOMA   -  NEGATIVE FOR HIGH-GRADE DYSPLASIA AND MALIGNANCY  HER2 Non-Breast: NEGATIVE  Score: 1+    02/08/2017. LIVER (FINE NEEDLE ASPIRATION):   -  NO EVIDENCE OF MALIGNANCY IDENTIFIED.    -  SMALL DETACHED FRAGMENTS OF LIVER PARENCHYMA AND SUPERFICIAL GASTRIC        FOVEOLAR EPITHELIUM.    07/30/2017 total gastrectomy/ en bloc left lateral lobe liver resection with D2 lymph node dissection  Residual adenocarcinoma located in the gastric cardia and body with involvement of the gastroesophageal junction and direct invasion of the 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 [10/ 16 ] with extracapsular extension, surgical margins are negative for carcinoma    Imaging Results:     02/14/2017, PET/CT scan showed FDG uptake in the anterior wall of the antrum consistent with malignancy, 1.87 with a lymph node within the stomach and liver, no obvious liver metastasis  02/21/2017, MRI of the abdomen there is 4-5 mm nonenhancing lesion noted within the left lobe of the liver which were present and atypical cyst or

## 2017-10-14 NOTE — Progress Notes
bloc left lateral lobe of the liver resection with D2 lymph node dissection feeding jejunostomy in July 30, 2017. Patient recovered from the surgery and currently on postoperative chemotherapy with 5-fluorouracil and leucovorin  Patient is here for a follow-up she has received the first postoperative chemotherapy with continuous infusion 5-FU and leucovorin 2 weeks ago and tolerated reasonably well although she has quite a bit nausea and taking Zofran as well as Compazine as needed. She had received them and as well as saw from before the chemo therapy but she is having unbelievable nausea. She still have J-tube in place and using for feeding purposes. She is also taking oral intake without much problem. Patient denies any fever or chills cough chest pain or shortness of breath, has fatigue but continued to work at home. No bleeding problems no petechiae ecchymosis  Interim medical history surgical history reviewed no change from previous visit December 2018               Past Surgical History:   Past Surgical History:   Procedure Laterality Date   ? BREAST RECONSTRUCTION  2016   ? CATARACT EXTRACTION W/ INTRAOCULAR LENS IMPLANT     ? COLONOSCOPY W/ BIOPSY  01/28/2017   ? EGD W/ BIOPSY  01/28/2017   ? EUS UPPER W/ FNA  02/08/2017   ? HYSTERECTOMY  2003    uterine fibroids   ? LASIK     ? MASTECTOMY  2011   Diagnostic laparoscopy and laparoscopic feeding jejunostomy tube placement 03/13/2017  07/30/2017, laparotomy, total gastrectomy with en bloc left lateral lobe of the liver resection with D2 lymph node dissection, feeding jejunostomy    Social History:    Social History     Social History   ? Marital status: Single     Spouse name: N/A   ? Number of children: 0   ? Years of education: N/A     Occupational History   ? Not on file.     Social History Main Topics   ? Smoking status: Former Smoker     Packs/day: 1.00     Years: 15.00     Types: Cigarettes     Start date: 06/24/1974     Quit date: 11/29/1988

## 2017-10-14 NOTE — Progress Notes
her friend about the need of further chemotherapy and per patient request I will hold off oxaliplatin and will give 5-fluorouracil/leucovorin combination chemo therapy. Patient voiced it would understanding and agree to proceed with. I plan to see her back again 2 weeks after the first treatment. I spent more than 45 minutes including 35 minutes of counseling time answered all their questions to their satisfaction and also discussed with Dr. Genia Harold as well     October 14, 2017  I will continue chemo therapy with FOLFOX without oxaliplatin 2 weeks for total of 4 treatments. He has completed 1 without much problem except nausea. I advised her to take iron supplement once daily and plan to see her back again for weeks with CBC CMP CEA for follow-up. I spent more than 35 minutes including 25 minutes counseling time answered all her questions to her satisfaction  For rest of her medical problem and continue with current management and follow with her primary physician. I have encouraged her to call me directly if she has any questions or if I can be of any further assistance. Results of most recent studies discussed with patient. All questions were answered to patient's satisfaction. Patient agreeable to the above plan. More than 50% of that time was spent on coordination of care, counseling, further diagnostic and therapeutic clinic and follow-up care. This document was created using voice recognition software. Inadvertent typographical errors, word omissions and/or word substitutions may exist.         Return to Clinic:  in 4 weeks.  fax  Dr. Rosaura Carpenter,  Dr. Tamsen Meek [place]  .

## 2017-10-14 NOTE — Progress Notes
?   Creatinine 09/09/2017 0.63    ? EGFR 09/09/2017 98    ? Glom Filt Rate, Est Afri* 09/09/2017 114    ? BUN/Creatinine Ratio 73/53/2992 NOT APPLICABLE    ? Sodium 09/09/2017 142    ? Potassium 09/09/2017 4.2    ? Chloride 09/09/2017 105    ? CARBON DIOXIDE 09/09/2017 27    ? Calcium 09/09/2017 9.3    ? Protein, Total 09/09/2017 6.3    ? ALBUMIN 09/09/2017 4.1    ? Globulin 09/09/2017 2.2    ? ALBUMIN/GLOBULIN RATIO 09/09/2017 1.9    ? Total Bilirubin 09/09/2017 0.4    ? Alkaline Phosphatase 09/09/2017 94    ? AST 09/09/2017 23    ? ALT 09/09/2017 25    ? LD 09/09/2017 133    ? Ferritin 09/09/2017 12    ? Iron 09/09/2017 27*   ? TIBC 09/09/2017 448    ? Iron Saturation 09/09/2017 6*   ? CEA 09/09/2017 0.8    Orders Only on 08/19/2017   Component Date Value   ? TSH, 3RD GENERATION 08/20/2017 3.02    ? Free T4 08/20/2017 1.2    ? T3 Free 08/20/2017 3.0      02/19/2017 CBC, CMP grossly normal, CEA 1.5, LDH 124  03/19/2017 CBC grossly normal except hemoglobin 11.5 CMP within normal limits, reviewed with the patient today March 19, 2017  04/02/2017 CBC grossly normal except hemoglobin 11.0 CMP grossly normal, reviewed with the patient and her sister today April 08, 2017 ,   05/01/2017 CBC grossly normal except hemoglobin 10.7, platelet 10/30/1998, CMP grossly normal except albumin 3.3, AST 103, reviewed with the patient today May 06, 2017  06/04/2017, CBC grossly normal except hemoglobin 10.1, CMP not available, B12 2000, reviewed with the patient today June 06, 2017  06/18/2017 CBC grossly normal except hemoglobin 10.8, CMP within normal limits reviewed with the patient and her friend today June 27, 2014  08/20/2017 CBC grossly normal except hemoglobin 11.0, CMP within normal limit except alkaline phosphatase 166, T3-T4 TSH, normal reviewed with the patient today August 26, 2017  09/09/2017 CBC grossly normal except hemoglobin 10.6, CMP within normal

## 2017-10-14 NOTE — Progress Notes
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 The patient has a Mediport placed as of this Friday, 02/22/2017. I discussed today with her patient and her sister regarding her diagnosis, prognosis and treatments for this type of tumor. I explained in great detail the effects and side effects of chemotherapy especially neuropathy with oxaliplatin possible chest pain or discomfort with 5-FU, less of alopecia and no major other side effects. In general this tablet treatment is relatively well tolerated without excessive cytopenias. I prescribed antinausea medications in case of chemotherapy associated nausea and vomiting, to her local pharmacy. We did add toward with the patient and the sister of our chemotherapy unit and introduce her to our chemotherapy nurses. I have prescribed chemotherapy to start tentatively on Wednesday, Feb 27, 2017., Achieved complete remission by PET CT scan 04/25/2017    Given her heavy family history of breast cancer, colon cancer, prostate cancer, I have requested a genetic evaluation testing by myriad laboratories. At [facility] [place] Foundation testing was requested and results need to be followed for molecular biology of the tumor to include MSI status or other clinically relevant molecular markers.    The patient will return to this clinic for follow-up, most probable with Dr. Lurena Joiner, in 2 weeks after her first round of chemotherapy to assess for toxicity and possible side effects.    The patient and her sister had a long list of questions regarding diagnosis, treatment, sequencing of multi modality treatments, side effects of chemotherapy and this questions were answered to the best of my knowledge during this initial evaluation.    It is of note is that the knee CT scan of the chest abdomen and pelvis

## 2017-10-14 NOTE — Progress Notes
she preferred to transfer her treatment to this facility and also to the [place] and she has seen Dr. Freddrick March also as of yesterday, who recommended an MRI of the liver as well as neoadjuvant chemotherapy. I do not have results of her PET/CT scan but the patient told me verbally that this was negative for liver lesions, initially described at a recent CT scan of the upper and lower abdomen. There was no other distant metastasis but there was lymphadenopathy associated with the stomach cancer. The patient is scheduled to undergo a Mediport placement by Dr. Dara Lords as of Friday 02/22/2017. She was suggested to have FOLFOX chemotherapy as soon as possible.  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 Patient couldn't tolerate tamoxifen, which has been discontinued. Patient also BRCA 1 and 2 negative in the past  Patient is here for the first course of chemotherapy with FOLFOX. She has many questions about her disease process as well as the biopsy results and also MRI results. I have discussed this in length with the patient and felt that currently she is only as stage III disease by MRI. And as per Dr. Freddrick March, surgeons recommendation I will proceed with FOLFOX chemotherapy at this time. Patient still has mild dysphagia but able to bring labs of protein supplements. Denies any fever or chills cough or chest pain or shortness of breath. I left a message with Dr. Robbie Lis, oncologist from [facility]

## 2017-10-14 NOTE — Progress Notes
?    ARMOUR THYROID 15 MG PO Tablet, TK 3 TS PO ONCE D OES, Disp: , Rfl: 3  ?  CARAFATE 1 GM/10ML PO Suspension, TK 10 ML PO TID OES 1 HOUR B MEALS AND HS, Disp: , Rfl: 2  ?  colesevelam (WELCHOL) 625 MG PO Tablet, , Disp: , Rfl: 6  ?  cyanocobalamin (vitamin B12) 1000 MCG/ML IJ Solution, INJ 1 ML  ONCE A MONTH, Disp: , Rfl: 6  ?  diclofenac (VOLTAREN GEL) 1 % TD Gel, APP AA BID UTD, Disp: , Rfl: 0  ?  DILAUDID 1 MG/ML PO Liquid, , Disp: , Rfl: 0  ?  eszopiclone (LUNESTA) 3 MG PO Tablet, , Disp: , Rfl: 2  ?  HYDROcodone-acetaminophen (NORCO) 10-325 MG PO Tablet, TK 1 T PO Q 6 H PRN, Disp: , Rfl: 0  ?  HYDROcodone-acetaminophen (NORCO) 5-325 MG PO Tablet, TK 1-2 TS PO Q 6 H, Disp: , Rfl: 0  ?  ibuprofen (ADVIL,MOTRIN) 600 MG PO Tablet, Take 1 tablet by mouth 3 times daily for 5 days., Disp: 15 tablet, Rfl: 0  ?  levoFLOXacin (LEVAQUIN) 500 MG PO Tablet, Take 1 tablet by mouth daily for 7 days., Disp: 7 tablet, Rfl: 0  ?  lidocaine-prilocaine (EMLA) 2.5-2.5 % EX Cream, Apply 30 minutes prior to treatment, Disp: 60 g, Rfl: 1  ?  medical marijuana, Take 600 mg by mouth., Disp: , Rfl:   ?  omeprazole (PriLOSEC) 40 MG PO Capsule Delayed Release, TK 1 C PO QD AC, Disp: , Rfl: 5  ?  ondansetron (ZOFRAN-ODT) 4 MG PO Tablet Disintegrating, , Disp: , Rfl: 3  ?  prochlorperazine (COMPAZINE) 10 MG PO Tablet, Take 1 tablet by mouth every 6 hours as needed., Disp: 60 tablet, Rfl: 11  ?  promethazine (PHENERGAN) 50 MG PO Tablet, TK 1/2 T PO Q 12 H PRN, Disp: , Rfl: 0  ?  QUEtiapine (SEROquel) 50 MG PO Tablet, , Disp: , Rfl: 0  ?  Senna 8.8 MG/5ML PO Syrup, Take 17.6 mg by mouth., Disp: , Rfl:       Labs:   Orders Only on 10/02/2017   Component Date Value   ? WHITE BLOOD CELL COUNT 10/07/2017 7.9    ? RBC 10/07/2017 3.98    ? Hemoglobin 10/07/2017 10.7*   ? Hematocrit 10/07/2017 33.1*   ? MCV 10/07/2017 83.2    ? [facility] 10/07/2017 26.9*   ? MCHC 10/07/2017 32.3    ? RDW 10/07/2017 13.5    ? Platelets 10/07/2017 245

## 2017-10-14 NOTE — Progress Notes
treatment with FOLFOX and tolerated very well except mild numbness of hands and feet lasting for few days. She is gaining weight continue to use J-tube and also able to eat regular food. Patient is been scheduled to have surgery and of October. Plan is to complete chemotherapy 4 weeks before the surgery. I have discussed the case with Dr. Genia Harold from [facility] who agree about giving total 8 courses of FOLFOX before surgery and then depending on pathology, we may consider postoperative chemotherapy if she has persistent tumor. 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 She also had a J-tube placed for feeding purposes as well.  , Patient has completed 7 treatment with FOLFOX chemotherapy and continued to be in excellent remission. She has been scheduled Surgery at [facility] and of October. She started having some numbness in the hands and feet and also had difficulty recovering from the last chemotherapy she would like to skip day 8 treatment in preparation for the surgery I believe it is reasonable to do that so that she will be in a better shape for the proposed total gastrectomy. Patient does have some reservations about the surgery after talking few people at surgery and had problems. I have her talk to one of my patient Ms. Covie who had the same surgical procedure done by Dr. Freddrick March last year and recovered very well and back to work now. Mr. Gossen talk to Mrs. Covie  for a while and decided to

## 2017-10-14 NOTE — Progress Notes
Assunta Gambles, MD  [address]  [place] [address]  [facility] [place] [address]    Chief Complaint   Patient presents with   ? Follow-up     Gastric adenocarcinoma    The patient has been referred to this clinic for workup and evaluation and treatment of adenocarcinoma of the GE junction.    Patient Name: Mariah Cameron      Date Of Birth: 1956-03-26    History of Present Illness:      Ms. 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 The patient started having severe acid reflux syndrome in November 2017 and she ended up having an endoscopy on 01/28/2017 with also endoscopic ultrasound on 02/08/2017. Results of the pathology by biopsy of a fungating mass were discussed as above. According to the patient's report the tumor was HER-2 negative. She was initially evaluated by medical oncologist at [facility] but she preferred to transfer her treatment to this facility and also to the [place] and she has seen Dr. Freddrick March also as of yesterday, who recommended an MRI of the liver as well as neoadjuvant chemotherapy. I do not have results of her PET/CT scan but the patient told me verbally that this was negative for liver lesions, initially described at a recent CT scan of the upper and lower abdomen. There was no other distant metastasis but there was lymphadenopathy associated with the stomach cancer. The patient is scheduled to undergo a Mediport placement by Dr. Dara Lords as of Friday 02/22/2017. She was suggested to have FOLFOX chemotherapy as soon as possible.    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 She had

## 2017-10-14 NOTE — Progress Notes
proceed with surgery next month as planned. She also scheduled to have a PET CT scan next week. 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 She also had a J-tube placed for feeding purposes as well.  Since I saw her last, patient underwent diagnostic laparoscopic conversion to open laparotomy, total gastrectomy with en bloc left lateral lobe of the liver resection with D2 lymph node dissection, feeding jejunostomy in July 30, 2017 at [facility] by Dr. Shade Flood. Patient tolerated the procedure well although developed leaking of the gastroesophagitis to ecchymosis and stayed in the hospital for 16 days. She is records from that and currently using G-tube feeding. She has an appointment to have upper GI done again to see whether there is any more leakage. Patient continued to be nothing by mouth. Path report did show local infiltration to the liver which has been dissected and 10 of 16 lymph nodes were positive with extracapsular extension  Patient is doing better although feeling little bit tired and fatigue, continued to have mild abdominal discomfort, using the G-tube for feeding, she lost a few pounds but looking good. Denies any fever chills cough chest pain or shortness of breath, no headache or dizziness no bleeding problems  Interim medical history and surgical history were reviewed as above

## 2017-10-14 NOTE — Progress Notes
a wellness check in March 2018 and this was completely negative. The patient also has history of breast cancer in 2010 and she is status post right mastectomy with breast reconstruction. She did not receive chemotherapy but she was treated with Herceptin for 50 months by an oncologist in [place]. According to her report she could not tolerate tamoxifen. In 2016 she had an episode of vertigo and CT scan of the head showed dural thickness.    The patient has never noticed blood in the stool or in the urine, she never had the yellow jaundice, she never had hepatitis or blood transfusions. She has lost approximately 20 pounds since December 2017. She has difficulties with nutrition especially with solid food, as she has to cut the food in small pieces and chew for long time and she always gets pain postprandially.    The patient used to be a smoker for 10 years but she quit in 1989 and she smoked approximately one pack per day. She used to drink wine.    Regarding her family history her father has dementia and heart disease. Her mother had breast cancer at the age of 14 her great-grandmother also had breast cancer. There is no ovarian cancer in the family. Her father has history of prostate cancer and colon cancer. Her paternal grandmother had also colon cancer.    03/04/2017    Ms. 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 The patient started having severe acid reflux syndrome in November 2017 and she ended up having an endoscopy on 01/28/2017 with also endoscopic ultrasound on 02/08/2017. Results of the pathology by biopsy of a fungating mass were discussed as above. According to the patient's report the tumor was HER-2 negative. She was initially evaluated by medical oncologist at [facility] but

## 2017-10-14 NOTE — Progress Notes
EUS, HER-2/neu negative patient also had a J-tube placed for feeding purposes due to persistent nausea and vomiting,  Patient is here for follow-up, she has completed 2 courses of chemotherapy and tolerated it reasonably well although continued to have nausea. She has been using jejunostomy tube and tolerating with some eval. Although she is having some dehydration and Gave extra fluid last week. 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 Patient is been able to swallow better although continued to use jejunostomy tube for feeding. She has gained weight, feeling much better although continued Fatigue and tiredness. She has been seen at [facility] , but she would like to get second opinion from [facility] which will be next week. Patient has not decided who she wants to have surgery done although I strongly proposed that my experience with Dr. Freddrick March is remarkable that Dr. Freddrick March does minimal invasive surgery extremely well and has more experience than anybody in town. She would like to get an opinion from [facility] which she already arranged for next week. Since patient had such a good response I recommend to continue

## 2017-10-14 NOTE — Progress Notes
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 She also had a J-tube placed for feeding purposes as well.  Since I saw her last, patient underwent diagnostic laparoscopic conversion to open laparotomy, total gastrectomy with en bloc left lateral lobe of the liver resection with D2 lymph node dissection, feeding jejunostomy in July 30, 2017 at [facility] by Dr. Shade Flood. Patient tolerated the procedure well although developed leaking of the gastroesophagitis to ecchymosis and stayed in the hospital for 16 days.  Patient is here for follow-up, she has been doing much better able to take full liquid, she continued to use jejunostomy for feeding. She had a CT scan of the abdomen pelvis done recently showed no evidence of disease. Patient has been seen by oncologist from [facility] Dr. Genia Harold were advised her to take another 5 more treatment complete the planned 12 treatments. 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 She also had a J-tube placed for feeding purposes as well. Patient underwent laparotomy  total gastrectomy with en

## 2017-10-14 NOTE — Progress Notes
Clinic it was decided to complete 8 treatment with FOLFOX which will complete in October 4 and surgery has been scheduled end of October and [facility] as per patient request. Patient already seen the surgeon and surgery schedule has been made. I will continue with chemo therapy every 2 weeks and as I mentioned it will be finished by October 4. I advised her to take a multivitamin with iron supplement once daily to improve the anemia. Encouraged to eat better and gain stream or weight before the surgery after the surgery she might lose a few pounds. I plan to see her back again 4 weeks with CBC CMP and LDH for follow-up. Patient also scheduled PET/CT scan in couple weeks. I spent more than 35 minutes including 25 minutes counseling time and answered all her questions to her satisfaction    June 27, 2017   Since patient already started developing peripheral neuropathy had completed 7 treatments and also scheduled for total gastrectomy in 4 weeks, I believe it is reasonable to hold the 8 treatment next week as requested by the patient so that she can physically getting ready for the surgery. I had a long discussion with the patient about the need of surgery for curative intent. And also made available to her to discuss surgery in postoperative complications from any with one of my patient who had synchronous surgery almost a year ago and recovered very quickly and also completed postoperative chemotherapy. Mrs. Joyner is able to talk to my patient Mrs. Covie extensively and decided to proceed with surgery next month and [facility]. I also advised her to keep the appointment for the PET scan next couple weeks and I plan to see her back again in 3-4 weeks with a CBC CMP for follow-up. I spent more than 45 minutes including 35 minutes counseling time and answered all her questions to her satisfaction     August 26, 2017   Although patient had R0 resection, she did have local extension to the

## 2017-10-14 NOTE — Progress Notes
for a total of 8 treatment before undergoing surgery since after surgery it would be difficult to provide adjuvant chemo therapy.   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 She also had a J-tube placed for feeding purposes as well.  Patient's here for a follow-up regarding her cancer as well as chemo therapy. She was seen in [facility] by surgeon as well as oncologists Dr. Benjie Karvonen who agreed with current plan of care including completion of 8 treatment with FOLFOX followed by surgery. Patient would like to have surgery done in [facility] and has been scheduled for end of October. Patient is been feeling much better now able to swallow and keeping it down continues to use J-tube for nutritional purposes. She has been feeling a bit tired and fatigued but much better than before start having some numbness and tingling in hands and feet. 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 She also had a J-tube placed for feeding purposes as well.  Patient is here for follow-up as well as for chemotherapy. She completed 6

## 2017-10-14 NOTE — Progress Notes
?   MPV 10/07/2017 10.9    ? Neutrophils Absolute 10/07/2017 5080    ? Lymphocytes Absolute 10/07/2017 2323    ? Monocytes Absolute 10/07/2017 182*   ? Eosinophils Absolute 10/07/2017 269    ? Basophils Absolute 10/07/2017 47    ? Neutrophils 10/07/2017 64.3    ? LYMPHOCYTES 10/07/2017 29.4    ? MONOCYTES 10/07/2017 2.3    ? EOSINOPHILS 10/07/2017 3.4    ? BASOPHILS 10/07/2017 0.6    ? CEA 10/07/2017 1.8    ? Glucose 10/07/2017 98    ? Urea Nitrogen 10/07/2017 16    ? Creatinine 10/07/2017 0.64    ? EGFR 10/07/2017 98    ? Glom Filt Rate, Est Afri* 10/07/2017 113    ? BUN/Creatinine Ratio 47/42/5956 NOT APPLICABLE    ? Sodium 10/07/2017 138    ? Potassium 10/07/2017 4.6    ? Chloride 10/07/2017 104    ? CARBON DIOXIDE 10/07/2017 29    ? Calcium 10/07/2017 9.2    ? Protein, Total 10/07/2017 6.4    ? ALBUMIN 10/07/2017 4.4    ? Globulin 10/07/2017 2.0    ? ALBUMIN/GLOBULIN RATIO 10/07/2017 2.2    ? Total Bilirubin 10/07/2017 0.2    ? Alkaline Phosphatase 10/07/2017 87    ? AST 10/07/2017 16    ? ALT 10/07/2017 22    ? LD 10/07/2017 126    ? Iron 10/07/2017 33*   ? TIBC 10/07/2017 481*   ? Iron Saturation 10/07/2017 7*   ? Ferritin 10/07/2017 12    Office Visit on 08/26/2017   Component Date Value   ? WHITE BLOOD CELL COUNT 09/09/2017 6.9    ? RBC 09/09/2017 3.83    ? Hemoglobin 09/09/2017 10.6*   ? Hematocrit 09/09/2017 32.4*   ? MCV 09/09/2017 84.6    ? Kelly 09/09/2017 27.7    ? MCHC 09/09/2017 32.7    ? RDW 09/09/2017 13.6    ? Platelets 09/09/2017 252    ? MPV 09/09/2017 11.1    ? Neutrophils Absolute 09/09/2017 3685    ? Lymphocytes Absolute 09/09/2017 2408    ? Monocytes Absolute 09/09/2017 469    ? Eosinophils Absolute 09/09/2017 290    ? Basophils Absolute 09/09/2017 48    ? Neutrophils 09/09/2017 53.4    ? LYMPHOCYTES 09/09/2017 34.9    ? MONOCYTES 09/09/2017 6.8    ? EOSINOPHILS 09/09/2017 4.2    ? BASOPHILS 09/09/2017 0.7    ? Glucose 09/09/2017 103*   ? Urea Nitrogen 09/09/2017 14

## 2017-10-14 NOTE — Progress Notes
small hemangioma no obvious additional lesions are seen within the liver, there is a mass within the stomach consistent with patient's history of stomach cancer  I have reviewed these reports with the patient today March 04, 2017  EUS, staged as T3 N3, M1 although biopsy of the liver negative for malignancy as his MRI and the PET scan  04/25/2017 PET CT scan showed no evidence of residual or metastatic gastric cancer, no evidence of other malignancy, reviewed with the patient today 05/06/2017  09/12/2017 CT scan of the abdomen and pelvis with contrast, showed status post total gastrectomy no CT evidence of intra-abdominal or intrapelvic prostatic disease reviewed with the patient today and her friend September 16, 2017      Physical Exam:   Physical examination  Patient is alert, not in any acute distress , tired and fatigue, able to tolerate clear liquids , ECOG performance status?0,   HEENT, no pallor nor icterus, throat clear is a petechia  Neck, no lymphadenopathy, no thyromegaly, no lymphadenopathy  Respiratory system, bilateral mild wheezing no rhonchi or vascular  Cardiac, S1-S2 regular  Abdomen, soft nontender no hepatosplenomegaly, bowel sounds are active, no other mass palpable, laparotomy  hea incisionled well,, feeding jejunostomy in place  Musculoskeletal, no joint pain, no muscle pain, mild numbness of the feet and hands which is slowly getting worse   CNS, alert oriented ?3, able to more 4 extremities, mild peripheral neuropathy  No peripheral edema both feet  Psychiatric, not agitated or anxious  October 14, 2017,,Detailed physical examination performed today as above      Assessment:      Status post laparotomy, total gastrectomy with en bloc left lateral lobe liver resection with D2 lymph node dissection, feeding jejunostomy  August 30, 2017 , R0 resection , T3 N2 M1 local extension to the liver resected   ,   This is a very pleasant 62 year old Caucasian female patient with

## 2017-10-14 NOTE — Progress Notes
liver which has been resected, 10 out of 16 lymph node positive, she is in high risk for recurrence and metastasis. 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 Genia Harold regarding this matter and advised to make an appointment in next 2 weeks with Dr. Genia Harold. Hopefully her postoperative leak will be resolved so on and able to start taking oral nourishment. I had long discussion with the patient about weight is options and also her prognosis would be better due to surgery, although long-term survival is not conceivable . Patient no stable understanding and achy to obtain opinion from Dr. Genia Harold and keep her appointment with her surgeon as well. I plan to see her back again 3 weeks with CBC CMP CEA, iron studies for follow-up. I spent more than 45 minutes including 35 minutes counseling time, also reviewed the medical records from [facility] including pathology report. And answered all her questions to her satisfaction     September 16, 2017  Patient is been able to tolerate clear liquids without any problem, continue to use jejunostomy tube for feeding but she is getting stronger has mild iron deficiency anemia which is resolving. As per Dr. 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 I have discussion with Dr. Genia Harold who also felt that patient might benefit with postoperative chemo therapy and agree to hold off oxaliplatin due to neuropathy which certainly will get worse with more oxaliplatin. Dr. Genia Harold does not suggest radiation therapy at this point especially with her involvement of the liver although was resected. I had long discussion with the patient and

## 2017-10-14 NOTE — Progress Notes
arrangements. I will give IV fluid today and tomorrow and advised to drink protein drinks as much she can. I also twice her that in today to have nausea vomiting she may need to go to the emergency room in May needs to be admitted to the hospital for jejunostomy tube and IV fluids. I plan to see her back again next week as planned before, I spent more than 35 minutes including 25 minutes counseling time and also discuss with Dr. Freddrick March    March 19, 2017  I'm happy to see that patient tolerated laparoscopic jejunostomy tube placement and currently tolerating the tube feeding very well. I have reviewed the labs and will proceed with second course of chemotherapy with FOLFOX at this time. I also used emend to prevent nausea so so with chemo therapy. I'm happy to see that patient has no involvement of the omentum or the peritoneum. I still believe she'll be a candidate for curative surgery if she has lost want to chemotherapy well. I will continue FOLFOX chemotherapy every 2 weeks and see her back again in 4 weeks with CBC CMP for follow-up. I spent more than 40 minutes including 30 minutes counseling time and answered all their questions to their satisfaction also reviewed the recent hospitalization for surgery as well    April 09, 2017   Labs were reviewed today and elected to continue with chemotherapy next week she will receive the day 15 of course 2 of chemotherapy with FOLFOX. . Advised to use the jejunostomy tube frequently also flush it with water to prevent dehydration . The patient wants to she will try to drink soups as well. . I plan to see her back again in 2-3 weeks for follow-up with CBC and CMP. After 6 treatments I plan to repeat a PET CT scan for follow-up I spent more than 40 minutes including 30 minutes counseling time and answered all their questions to their satisfaction     May 06, 2017  I am very happy to see that PET/CT scan showed complete remission without

## 2017-10-14 NOTE — Progress Notes
Mariah Cameron to see whether she had molecular analysis sent  Interim medical history, surgical history, family history, social history were reviewed, no major change from last visit June 2018   March 07, 2017, chief complaints  Patient is recently found to have poorly differentiated carcinoma of the GE junction, T3 N3 M0, start her on chemotherapy with FOLFOX.  Patient is here complains of increasing nausea and vomiting, not able to keep anything down., She is having obstructive symptoms., She had fluids yesterday as well. Denies any fever or chills or cough or chest pain feeling very tired and fatigued lost 2-3 pounds last few days, I have discussed with Dr. 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 Patient denies any fever or chills cough chest pain or shortness of breath. Eating only very minimal using the J-tube. 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

## 2017-10-14 NOTE — Progress Notes
any evidence of malignancy at this time. I will continue with chemo therapy preferably total of 8 treatment before undergoing surgery. After surgery will be difficult to provide adjuvant chemotherapy due to poor tolerance. But I will wait for her visit with [facility] and see what the surgeons offered there, if they want to  do the surgery after the 6 treatment, as planned before that will be okay too, then I would consider providing further adjuvant chemotherapy after the surgery even though difficult to administer due to poor tolerance. I plan to see her back again in 4 weeks with CBC CMP CEA for follow-up and also for further discussion and hopefully, she make up her mind about who should be the surgeon. I reviewed the labs and elected to continue with chemo therapy.  I spent more than 40 minutes including 30 minutes counseling time and answered all her questions to her satisfaction  May 23, 2017  Patient had good experience in [facility] and able to see oncologist Dr. Benjie Karvonen and surgeon. Patient decided to have her surgery done in [facility] which will be end of October. As preparation request I am holding one treatment this week and she will back again chemo therapy after holiday. Plan remain the same to give a course of chemotherapy and will have 4 weeks break before undergoing surgery. Patient tells me that Dr. Benjie Karvonen suggested to give him 12 treatments before the surgery but when I talked to Dr. Benjie Karvonen today  his recommendation remain the same to have the surgery after the 8 treatment and following surgery if feasible will give 4 more course of chemo therapy. I plan to see her back again in 2 weeks for follow-up as preparation request. I spent more than 35 minutes including 35 minutes counseling time answered all her questions to her satisfaction. I also discuss with Dr.Mody from [facility] as well  June 06, 2017  As per my previous discussion with the patient and Dr. Genia Harold from [facility]

## 2017-10-16 ENCOUNTER — Inpatient Hospital Stay: Admit: 2017-10-16 | Discharge: 2017-10-17 | Primary: Internal Medicine

## 2017-10-16 DIAGNOSIS — Z79899 Other long term (current) drug therapy: Secondary | ICD-10-CM

## 2017-10-16 DIAGNOSIS — C160 Malignant neoplasm of cardia: Secondary | ICD-10-CM

## 2017-10-16 DIAGNOSIS — Z87891 Personal history of nicotine dependence: Secondary | ICD-10-CM

## 2017-10-16 DIAGNOSIS — C169 Malignant neoplasm of stomach, unspecified: Secondary | ICD-10-CM

## 2017-10-16 DIAGNOSIS — Z5111 Encounter for antineoplastic chemotherapy: Principal | ICD-10-CM

## 2017-10-16 MED ORDER — LEUCOVORIN IVPB JX
400 mg/m2 | Freq: Once | INTRAVENOUS | Status: CP
Start: 2017-10-16 — End: ?

## 2017-10-16 MED ORDER — FLUOROURACIL IV SYRINGE BCN JX
400 mg/m2 | Freq: Once | INTRAVENOUS | Status: CP
Start: 2017-10-16 — End: ?

## 2017-10-16 MED ORDER — SODIUM CHLORIDE 0.9 % IV SOLN
Freq: Once | INTRAVENOUS | Status: DC
Start: 2017-10-16 — End: 2017-10-17

## 2017-10-16 MED ORDER — ONDANSETRON WITH DEXAMETHASONE IVPB (NON-SPECIFIED)
Freq: Once | INTRAVENOUS | Status: CP
Start: 2017-10-16 — End: ?

## 2017-10-16 MED ORDER — FOSAPREPITANT IVPB JX
150 mg | Freq: Once | INTRAVENOUS | Status: CP
Start: 2017-10-16 — End: ?

## 2017-10-16 MED ORDER — FLUOROURACIL INFUSION AMB PUMP BCN JX
2400 mg/m2 | Freq: Once | INTRAVENOUS | Status: DC
Start: 2017-10-16 — End: 2017-10-17

## 2017-10-16 MED ORDER — LORAZEPAM 2 MG/ML IJ SOLN
1 mg | Freq: Once | INTRAVENOUS | Status: CP
Start: 2017-10-16 — End: ?

## 2017-10-18 ENCOUNTER — Inpatient Hospital Stay: Admit: 2017-10-18 | Discharge: 2017-10-19 | Primary: Internal Medicine

## 2017-10-18 DIAGNOSIS — K219 Gastro-esophageal reflux disease without esophagitis: Secondary | ICD-10-CM

## 2017-10-18 DIAGNOSIS — Z87891 Personal history of nicotine dependence: Secondary | ICD-10-CM

## 2017-10-18 DIAGNOSIS — C160 Malignant neoplasm of cardia: Secondary | ICD-10-CM

## 2017-10-18 DIAGNOSIS — C169 Malignant neoplasm of stomach, unspecified: Principal | ICD-10-CM

## 2017-10-18 DIAGNOSIS — R634 Abnormal weight loss: Secondary | ICD-10-CM

## 2017-10-18 DIAGNOSIS — Z79899 Other long term (current) drug therapy: Secondary | ICD-10-CM

## 2017-10-18 MED ORDER — HEPARIN SODIUM LOCK FLUSH 100 UNIT/ML IV SOLN
500 [IU] | Status: DC | PRN
Start: 2017-10-18 — End: 2017-10-19

## 2017-10-18 MED ORDER — HEPARIN SODIUM LOCK FLUSH 100 UNIT/ML IV SOLN
500 [IU] | Status: CN | PRN
Start: 2017-10-18 — End: ?

## 2017-10-18 MED ORDER — SODIUM CHLORIDE 0.9 % IV SOLN
Freq: Once | INTRAVENOUS | Status: CN
Start: 2017-10-18 — End: ?

## 2017-10-18 MED ORDER — PALONOSETRON HCL 0.25 MG/5ML IV SOLN
.25 mg | Freq: Once | INTRAVENOUS | Status: CP
Start: 2017-10-18 — End: ?

## 2017-10-18 MED ORDER — LORAZEPAM 2 MG/ML IJ SOLN
.5 mg | Freq: Once | INTRAVENOUS | Status: CP
Start: 2017-10-18 — End: ?

## 2017-10-18 MED ORDER — PROMETHAZINE IN NS IVPB JX
12.5 mg | Freq: Once | INTRAVENOUS | Status: CN
Start: 2017-10-18 — End: ?

## 2017-10-18 MED ORDER — DIPHENHYDRAMINE IVPB JX
25 mg | Freq: Once | INTRAVENOUS | Status: CN
Start: 2017-10-18 — End: ?

## 2017-10-18 MED ORDER — LORAZEPAM 2 MG/ML IJ SOLN
.5 mg | Freq: Once | INTRAVENOUS | Status: CN
Start: 2017-10-18 — End: ?

## 2017-10-18 MED ORDER — ONDANSETRON IVPB BCN JX
8 mg | Freq: Once | INTRAVENOUS | Status: CN
Start: 2017-10-18 — End: ?

## 2017-10-18 MED ORDER — FAMOTIDINE IVPB JX
20 mg | Freq: Once | INTRAVENOUS | Status: CN
Start: 2017-10-18 — End: ?

## 2017-10-18 MED ORDER — SODIUM CHLORIDE FLUSH 0.9 % IV SOLN
10 mL | Status: CN | PRN
Start: 2017-10-18 — End: ?

## 2017-10-18 MED ORDER — PALONOSETRON HCL 0.25 MG/5ML IV SOLN
.25 mg | Freq: Once | INTRAVENOUS | Status: CN
Start: 2017-10-18 — End: ?

## 2017-10-18 MED ORDER — DEXAMETHASONE IVPB BCN JX
4 mg | Freq: Once | INTRAVENOUS | Status: CP
Start: 2017-10-18 — End: ?

## 2017-10-18 MED ORDER — SODIUM CHLORIDE 0.9 % IV SOLN
Freq: Once | INTRAVENOUS | Status: CP
Start: 2017-10-18 — End: ?

## 2017-10-18 MED ORDER — DEXAMETHASONE IVPB BCN JX
4 mg | Freq: Once | INTRAVENOUS | Status: CN
Start: 2017-10-18 — End: ?

## 2017-10-21 ENCOUNTER — Inpatient Hospital Stay: Admit: 2017-10-21 | Discharge: 2017-10-22 | Primary: Internal Medicine

## 2017-10-21 DIAGNOSIS — K219 Gastro-esophageal reflux disease without esophagitis: Secondary | ICD-10-CM

## 2017-10-21 DIAGNOSIS — C160 Malignant neoplasm of cardia: Principal | ICD-10-CM

## 2017-10-21 DIAGNOSIS — Z539 Procedure and treatment not carried out, unspecified reason: Principal | ICD-10-CM

## 2017-10-21 DIAGNOSIS — R1013 Epigastric pain: Secondary | ICD-10-CM

## 2017-10-21 MED ORDER — CARAFATE 1 GM/10ML PO SUSP
2 refills | Status: CN
Start: 2017-10-21 — End: ?

## 2017-10-21 MED ORDER — CARAFATE 1 GM/10ML PO SUSP
2 refills | Status: CP
Start: 2017-10-21 — End: ?

## 2017-10-21 NOTE — Progress Notes
Patient called this am to discuss future treatments as she did not tolerate this past week well at all. This was not communicated well to this RN and I thought patient needed IVF's and anti-nausea meds so I had her come in for evaluation. When she arrived to told me she did not feel like she needed any anti-nausea meds or IVF's. She really wanted to talk to Dr. Lurena Joiner about future treatments as she did not feel like she go continue. She stated she started on Thursday after pump connection on Wednesday with burning sensations throughout her intestines and esphagus.  She is unable to eat or drink. She does use her feeding tube.  Upon reviewing her medications I asked if she was still using the Carafate and she stated she didn't think she ever used that.  I renewed her script to [facility]. I advised her to let me know over the next few days if she felt she needed IVF's or some IV Pepcid. She is scheduled to see Dr. Lurena Joiner on Thursday, 10-24-17.

## 2017-10-24 ENCOUNTER — Encounter: Primary: Internal Medicine

## 2017-10-24 ENCOUNTER — Ambulatory Visit: Admit: 2017-10-24 | Discharge: 2017-10-24 | Attending: Hematology & Oncology | Primary: Internal Medicine

## 2017-10-24 DIAGNOSIS — C169 Malignant neoplasm of stomach, unspecified: Secondary | ICD-10-CM

## 2017-10-24 DIAGNOSIS — R9389 Abnormal findings on diagnostic imaging of other specified body structures: Secondary | ICD-10-CM

## 2017-10-24 DIAGNOSIS — K219 Gastro-esophageal reflux disease without esophagitis: Secondary | ICD-10-CM

## 2017-10-24 DIAGNOSIS — K769 Liver disease, unspecified: Secondary | ICD-10-CM

## 2017-10-24 DIAGNOSIS — R1310 Dysphagia, unspecified: Secondary | ICD-10-CM

## 2017-10-24 DIAGNOSIS — Z803 Family history of malignant neoplasm of breast: Secondary | ICD-10-CM

## 2017-10-24 DIAGNOSIS — Z903 Acquired absence of stomach [part of]: Secondary | ICD-10-CM

## 2017-10-24 DIAGNOSIS — Z934 Other artificial openings of gastrointestinal tract status: Secondary | ICD-10-CM

## 2017-10-24 DIAGNOSIS — Z09 Encounter for follow-up examination after completed treatment for conditions other than malignant neoplasm: Secondary | ICD-10-CM

## 2017-10-24 DIAGNOSIS — D501 Sideropenic dysphagia: Secondary | ICD-10-CM

## 2017-10-24 DIAGNOSIS — D219 Benign neoplasm of connective and other soft tissue, unspecified: Secondary | ICD-10-CM

## 2017-10-24 DIAGNOSIS — Z87891 Personal history of nicotine dependence: Secondary | ICD-10-CM

## 2017-10-24 DIAGNOSIS — C159 Malignant neoplasm of esophagus, unspecified: Principal | ICD-10-CM

## 2017-10-24 DIAGNOSIS — E039 Hypothyroidism, unspecified: Secondary | ICD-10-CM

## 2017-10-24 DIAGNOSIS — R634 Abnormal weight loss: Secondary | ICD-10-CM

## 2017-10-24 DIAGNOSIS — G8929 Other chronic pain: Secondary | ICD-10-CM

## 2017-10-24 DIAGNOSIS — M1990 Unspecified osteoarthritis, unspecified site: Secondary | ICD-10-CM

## 2017-10-24 DIAGNOSIS — Z8042 Family history of malignant neoplasm of prostate: Secondary | ICD-10-CM

## 2017-10-24 DIAGNOSIS — M810 Age-related osteoporosis without current pathological fracture: Secondary | ICD-10-CM

## 2017-10-24 DIAGNOSIS — F419 Anxiety disorder, unspecified: Secondary | ICD-10-CM

## 2017-10-24 DIAGNOSIS — C50911 Malignant neoplasm of unspecified site of right female breast: Secondary | ICD-10-CM

## 2017-10-24 DIAGNOSIS — M479 Spondylosis, unspecified: Secondary | ICD-10-CM

## 2017-10-24 DIAGNOSIS — R1013 Epigastric pain: Secondary | ICD-10-CM

## 2017-10-24 DIAGNOSIS — Z800 Family history of malignant neoplasm of digestive organs: Secondary | ICD-10-CM

## 2017-10-24 MED ORDER — HYDROCORTISONE NA SUCCINATE PF 100 MG IJ SOLR
100 mg | INTRAVENOUS | Status: CN | PRN
Start: 2017-10-24 — End: ?

## 2017-10-24 MED ORDER — HEPARIN SODIUM LOCK FLUSH 100 UNIT/ML IV SOLN
500 [IU] | Status: CN | PRN
Start: 2017-10-24 — End: ?

## 2017-10-24 MED ORDER — METOCLOPRAMIDE 5 MG/5ML PO LIQD JX
5 mg | Freq: Four times a day (QID) | ORAL | 4 refills | Status: CP
Start: 2017-10-24 — End: ?

## 2017-10-24 MED ORDER — FERRIC CARBOXYMALTOSE IVPB
750 mg | Freq: Once | INTRAVENOUS | Status: CN
Start: 2017-10-24 — End: ?

## 2017-10-24 MED ORDER — DEXAMETHASONE SODIUM PHOSPHATE 20 MG/5ML IJ SOLN
20 mg | INTRAVENOUS | Status: CN | PRN
Start: 2017-10-24 — End: ?

## 2017-10-24 MED ORDER — SODIUM CHLORIDE FLUSH 0.9 % IV SOLN
10 mL | Status: CN | PRN
Start: 2017-10-24 — End: ?

## 2017-10-24 MED ORDER — MEPERIDINE HCL 25 MG/ML IJ SOLN
25 mg | INTRAVENOUS | Status: CN | PRN
Start: 2017-10-24 — End: ?

## 2017-10-24 MED ORDER — SODIUM CHLORIDE 0.9 % IV SOLN
Freq: Once | INTRAVENOUS | Status: CN
Start: 2017-10-24 — End: ?

## 2017-10-24 MED ORDER — DIPHENHYDRAMINE HCL 50 MG/ML IJ SOLN
25 mg | INTRAVENOUS | Status: CN | PRN
Start: 2017-10-24 — End: ?

## 2017-10-24 MED ORDER — EPINEPHRINE 0.3 MG/0.3ML IJ SOAJ
0.3 mg | INTRAMUSCULAR | Status: CN | PRN
Start: 2017-10-24 — End: ?

## 2017-10-24 NOTE — Progress Notes
Mariah Cameron to see whether she had molecular analysis sent  Interim medical history, surgical history, family history, social history were reviewed, no major change from last visit June 2018   March 07, 2017, chief complaints  Patient is recently found to have poorly differentiated carcinoma of the GE junction, T3 N3 M0, start her on chemotherapy with FOLFOX.  Patient is here complains of increasing nausea and vomiting, not able to keep anything down., She is having obstructive symptoms., She had fluids yesterday as well. Denies any fever or chills or cough or chest pain feeling very tired and fatigued lost 2-3 pounds last few days, I have discussed with Dr. 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 Patient denies any fever or chills cough chest pain or shortness of breath. Eating only very minimal using the J-tube. 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

## 2017-10-24 NOTE — Progress Notes
and hands which is slowly getting worse   CNS, alert oriented ?3, able to more 4 extremities, mild peripheral neuropathy  No peripheral edema both feet  Psychiatric, not agitated or anxious  October 24, 2017,,Detailed physical examination performed today as above      Assessment:      Status post laparotomy, total gastrectomy with en bloc left lateral lobe liver resection with D2 lymph node dissection, feeding jejunostomy  August 30, 2017 , R0 resection , T3 N2 M1 local extension to the liver resected   Persistent nausea vomiting from chemo therapy with 5-FU leucovorin  ,   This is a very pleasant 62 year old Caucasian female patient with 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 The patient has a Mediport placed as of this Friday, 02/22/2017. I discussed today with her patient and her sister regarding her diagnosis, prognosis and treatments for this type of tumor. I explained in great detail the effects and side effects of chemotherapy especially neuropathy with oxaliplatin possible chest pain or discomfort with 5-FU, less of alopecia and no major other side effects. In general this tablet treatment is relatively well tolerated without excessive cytopenias. I prescribed antinausea medications in case of chemotherapy associated nausea and vomiting, to her local pharmacy. We did add toward with the patient and the sister of our chemotherapy unit and introduce her to our chemotherapy nurses. I have prescribed chemotherapy to start tentatively on Wednesday, Feb 27, 2017., Achieved complete remission by PET CT scan 04/25/2017    Given her heavy family history of breast cancer, colon cancer, prostate cancer, I have requested a genetic evaluation testing by myriad laboratories. At [facility] M[facility] [place] [facility] testing was

## 2017-10-24 NOTE — Progress Notes
surgery in postoperative complications from any with one of my patient who had synchronous surgery almost a year ago and recovered very quickly and also completed postoperative chemotherapy. [name] is able to talk to my patient Mrs. Mariah Cameron extensively and decided to proceed with surgery next month and [facility]. I also advised her to keep the appointment for the PET scan next couple weeks and I plan to see her back again in 3-4 weeks with a CBC CMP for follow-up. I spent more than 45 minutes including 35 minutes counseling time and answered all her questions to her satisfaction     August 26, 2017   Although patient had R0 resection, she did have local extension to the liver which has been resected, 10 out of 16 lymph node positive, she is in high risk for recurrence and metastasis. 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 Genia Harold regarding this matter and advised to make an appointment in next 2 weeks with Dr. Genia Harold. Hopefully her postoperative leak will be resolved so on and able to start taking oral nourishment. I had long discussion with the patient about weight is options and also her prognosis would be better due to surgery, although long-term survival is not conceivable . Patient no stable understanding and achy to obtain opinion from Dr. Genia Harold and keep her appointment with her surgeon as well. I plan to see her back again 3 weeks with CBC CMP CEA, iron studies for follow-up. I spent more than 45 minutes including 35 minutes counseling time, also reviewed the medical records from [facility] including pathology report. And answered all her questions to her satisfaction     September 16, 2017  Patient is been able to tolerate clear liquids without any problem,

## 2017-10-24 NOTE — Progress Notes
?    cyanocobalamin (vitamin B12) 1000 MCG/ML IJ Solution, INJ 1 ML  ONCE A MONTH, Disp: , Rfl: 6  ?  diclofenac (VOLTAREN GEL) 1 % TD Gel, APP AA BID UTD, Disp: , Rfl: 0  ?  DILAUDID 1 MG/ML PO Liquid, , Disp: , Rfl: 0  ?  eszopiclone (LUNESTA) 3 MG PO Tablet, , Disp: , Rfl: 2  ?  HYDROcodone-acetaminophen (NORCO) 10-325 MG PO Tablet, TK 1 T PO Q 6 H PRN, Disp: , Rfl: 0  ?  HYDROcodone-acetaminophen (NORCO) 5-325 MG PO Tablet, TK 1-2 TS PO Q 6 H, Disp: , Rfl: 0  ?  ibuprofen (ADVIL,MOTRIN) 600 MG PO Tablet, Take 1 tablet by mouth 3 times daily for 5 days., Disp: 15 tablet, Rfl: 0  ?  levoFLOXacin (LEVAQUIN) 500 MG PO Tablet, Take 1 tablet by mouth daily for 7 days., Disp: 7 tablet, Rfl: 0  ?  lidocaine-prilocaine (EMLA) 2.5-2.5 % EX Cream, Apply 30 minutes prior to treatment, Disp: 60 g, Rfl: 1  ?  medical marijuana, Take 600 mg by mouth., Disp: , Rfl:   ?  metoclopramide (REGLAN) 5 MG/5ML PO Solution liquid, Take 5 mLs by mouth 4 times daily (before meals and nightly)., Disp: 480 mL, Rfl: 4  ?  omeprazole (PriLOSEC) 40 MG PO Capsule Delayed Release, TK 1 C PO QD AC, Disp: , Rfl: 5  ?  ondansetron (ZOFRAN-ODT) 4 MG PO Tablet Disintegrating, , Disp: , Rfl: 3  ?  prochlorperazine (COMPAZINE) 10 MG PO Tablet, Take 1 tablet by mouth every 6 hours as needed., Disp: 60 tablet, Rfl: 11  ?  promethazine (PHENERGAN) 50 MG PO Tablet, TK 1/2 T PO Q 12 H PRN, Disp: , Rfl: 0  ?  QUEtiapine (SEROquel) 50 MG PO Tablet, , Disp: , Rfl: 0  ?  Senna 8.8 MG/5ML PO Syrup, Take 17.6 mg by mouth., Disp: , Rfl:       Labs:   Hospital Outpatient Visit on 10/16/2017   Component Date Value   ? WBC 10/16/2017 5.30    ? RBC 10/16/2017 3.45    ? Hemoglobin 10/16/2017 9.4*   ? Hematocrit 10/16/2017 29.2*   ? MCV 10/16/2017 84.6    ? [facility] 10/16/2017 27.2    ? MCHC 10/16/2017 32.2    ? RDW 10/16/2017 14.2    ? Platelet Count 10/16/2017 180    ? MPV 10/16/2017 10.3    ? Neutrophils Absolute 10/16/2017 2.69    ? Neutrophils % 10/16/2017 50.7

## 2017-10-24 NOTE — Progress Notes
treatment with FOLFOX and tolerated very well except mild numbness of hands and feet lasting for few days. She is gaining weight continue to use J-tube and also able to eat regular food. Patient is been scheduled to have surgery and of October. Plan is to complete chemotherapy 4 weeks before the surgery. I have discussed the case with Dr. Genia Harold from [facility] who agree about giving total 8 courses of FOLFOX before surgery and then depending on pathology, we may consider postoperative chemotherapy if she has persistent tumor. 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 She also had a J-tube placed for feeding purposes as well.  , Patient has completed 7 treatment with FOLFOX chemotherapy and continued to be in excellent remission. She has been scheduled Surgery at [facility] and of October. She started having some numbness in the hands and feet and also had difficulty recovering from the last chemotherapy she would like to skip day 8 treatment in preparation for the surgery I believe it is reasonable to do that so that she will be in a better shape for the proposed total gastrectomy. Patient does have some reservations about the surgery after talking few people at surgery and had problems. I have her talk to one of my patient Ms. Covie who had the same surgical procedure done by Dr. Freddrick March last year and recovered very well and back to work now. Mr. Wahlquist talk to Mrs. Covie  for a while and decided to

## 2017-10-24 NOTE — Progress Notes
a wellness check in March 2018 and this was completely negative. The patient also has history of breast cancer in 2010 and she is status post right mastectomy with breast reconstruction. She did not receive chemotherapy but she was treated with Herceptin for 50 months by an oncologist in [place]. According to her report she could not tolerate tamoxifen. In 2016 she had an episode of vertigo and CT scan of the head showed dural thickness.    The patient has never noticed blood in the stool or in the urine, she never had the yellow jaundice, she never had hepatitis or blood transfusions. She has lost approximately 20 pounds since December 2017. She has difficulties with nutrition especially with solid food, as she has to cut the food in small pieces and chew for long time and she always gets pain postprandially.    The patient used to be a smoker for 10 years but she quit in 1989 and she smoked approximately one pack per day. She used to drink wine.    Regarding her family history her father has dementia and heart disease. Her mother had breast cancer at the age of 14 her great-grandmother also had breast cancer. There is no ovarian cancer in the family. Her father has history of prostate cancer and colon cancer. Her paternal grandmother had also colon cancer.    03/04/2017    Ms. 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 The patient started having severe acid reflux syndrome in November 2017 and she ended up having an endoscopy on 01/28/2017 with also endoscopic ultrasound on 02/08/2017. Results of the pathology by biopsy of a fungating mass were discussed as above. According to the patient's report the tumor was HER-2 negative. She was initially evaluated by medical oncologist at [facility] but

## 2017-10-24 NOTE — Progress Notes
bloc left lateral lobe of the liver resection with D2 lymph node dissection feeding jejunostomy in July 30, 2017. Patient recovered from the surgery and currently on postoperative chemotherapy with 5-fluorouracil and leucovorin  Patient is here for a follow-up she has received the first postoperative chemotherapy with continuous infusion 5-FU and leucovorin 2 weeks ago and tolerated reasonably well although she has quite a bit nausea and taking Zofran as well as Compazine as needed. She had received them and as well as saw from before the chemo therapy but she is having unbelievable nausea. She still have J-tube in place and using for feeding purposes. She is also taking oral intake without much problem. Patient denies any fever or chills cough chest pain or shortness of breath, has fatigue but continued to work at home. 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 She also had a J-tube placed for feeding purposes as well. Patient underwent laparotomy  total gastrectomy with en bloc left lateral lobe of the liver resection with D2 lymph node dissection feeding jejunostomy in July 30, 2017. Patient recovered from the surgery and currently on postoperative chemotherapy with 5-fluorouracil and leucovorin  Patient has taken 3 course of chemotherapy with continuous infusion 5-FU leucovorin but having quite a bit nausea and 1 maturing. Last chemo therapy she said she had stomach pain and also persistent nausea vomiting

## 2017-10-24 NOTE — Progress Notes
06/18/2017 CBC grossly normal except hemoglobin 10.8, CMP within normal limits reviewed with the patient and her friend today June 27, 2014  08/20/2017 CBC grossly normal except hemoglobin 11.0, CMP within normal limit except alkaline phosphatase 166, T3-T4 TSH, normal reviewed with the patient today August 26, 2017  09/09/2017 CBC grossly normal except hemoglobin 10.6, CMP within normal limit, LDH 133, iron studies showed iron saturation 6%, ferritin 12, CEA 0.8, T3-T4 TSH normal, reviewed with the patient today September 16, 2017  10/07/2016, CBC grossly normal except hemoglobin 10.7, CMP within normal limit, iron studies consistent with mild iron deficiency iron saturation 7%, ferritin 12, LDH 126, CEA 1.8, reviewed with the patient today in October 14, 2017  10/16/2017 CBC grossly normal except hemoglobin 9.4, iron studies consistent with iron deficiency including iron saturation 6 ferritin 12, , reviewed with the patient today October 24, 2016      5/4/2018A. 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 SEE COMMENT    B. ANTRUM (ENDOSCOPIC BIOPSY):   -  MILD CHRONIC GASTRITIS WITH MINIMAL ACTIVITY.   -  NO HELICOBACTER PYLORI IDENTIFIED BY MORPHOLOGICAL EVALUATION.   -  NO INTESTINAL METAPLASIA, DYSPLASIA OR MALIGNANCY IDENTIFIED.    C. POLYP DESCENDING (ENDOSCOPIC BIOPSY):   -  TUBULAR ADENOMA   -  NEGATIVE FOR HIGH-GRADE DYSPLASIA AND MALIGNANCY  HER2 Non-Breast: NEGATIVE  Score: 1+    02/08/2017. LIVER (FINE NEEDLE ASPIRATION):   -  NO EVIDENCE OF MALIGNANCY IDENTIFIED.    -  SMALL DETACHED FRAGMENTS OF LIVER PARENCHYMA AND SUPERFICIAL GASTRIC        FOVEOLAR EPITHELIUM.    07/30/2017 total gastrectomy/ en bloc left lateral lobe liver resection with D2 lymph node dissection  Residual adenocarcinoma located in the gastric cardia and body with involvement of the gastroesophageal junction and direct invasion of the

## 2017-10-24 NOTE — Progress Notes
for a total of 8 treatment before undergoing surgery since after surgery it would be difficult to provide adjuvant chemo therapy.   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 She also had a J-tube placed for feeding purposes as well.  Patient's here for a follow-up regarding her cancer as well as chemo therapy. She was seen in [facility] by surgeon as well as oncologists Dr. Benjie Karvonen who agreed with current plan of care including completion of 8 treatment with FOLFOX followed by surgery. Patient would like to have surgery done in [facility] and has been scheduled for end of October. Patient is been feeling much better now able to swallow and keeping it down continues to use J-tube for nutritional purposes. She has been feeling a bit tired and fatigued but much better than before start having some numbness and tingling in hands and feet. 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 She also had a J-tube placed for feeding purposes as well.  Patient is here for follow-up as well as for chemotherapy. She completed 6

## 2017-10-24 NOTE — Progress Notes
weeks break before undergoing surgery. Patient tells me that Dr. Benjie Karvonen suggested to give him 12 treatments before the surgery but when I talked to Dr. Benjie Karvonen today  his recommendation remain the same to have the surgery after the 8 treatment and following surgery if feasible will give 4 more course of chemo therapy. I plan to see her back again in 2 weeks for follow-up as preparation request. I spent more than 35 minutes including 35 minutes counseling time answered all her questions to her satisfaction. I also discuss with Dr.Mody from [facility] as well  June 06, 2017  As per my previous discussion with the patient and Dr. Genia Harold from [facility] it was decided to complete 8 treatment with FOLFOX which will complete in October 4 and surgery has been scheduled end of October and [facility] as per patient request. Patient already seen the surgeon and surgery schedule has been made. I will continue with chemo therapy every 2 weeks and as I mentioned it will be finished by October 4. I advised her to take a multivitamin with iron supplement once daily to improve the anemia. Encouraged to eat better and gain stream or weight before the surgery after the surgery she might lose a few pounds. I plan to see her back again 4 weeks with CBC CMP and LDH for follow-up. Patient also scheduled PET/CT scan in couple weeks. I spent more than 35 minutes including 25 minutes counseling time and answered all her questions to her satisfaction    June 27, 2017   Since patient already started developing peripheral neuropathy had completed 7 treatments and also scheduled for total gastrectomy in 4 weeks, I believe it is reasonable to hold the 8 treatment next week as requested by the patient so that she can physically getting ready for the surgery. I had a long discussion with the patient about the need of surgery for curative intent. And also made available to her to discuss

## 2017-10-24 NOTE — Progress Notes
requested and results need to be followed for molecular biology of the tumor to include MSI status or other clinically relevant molecular markers.    The patient will return to this clinic for follow-up, most probable with Dr. Lurena Joiner, in 2 weeks after her first round of chemotherapy to assess for toxicity and possible side effects.    The patient and her sister had a long list of questions regarding diagnosis, treatment, sequencing of multi modality treatments, side effects of chemotherapy and this questions were answered to the best of my knowledge during this initial evaluation.    It is of note is that the knee CT scan of the chest abdomen and pelvis from Feb 05, 2017 to indeterminate subcentimeter low-density lesions were noted within the lateral segment of the left hepatic lobe. These were biopsied by fine-needle aspiration at [facility] and were negative for malignancy. Most recent PET CT scan at Dr. Dario Guardian facility did not confirm liver metastasis.    With today's evaluation also I have requested baseline laboratory testing to include a CBC, CMP, LDH, CEA.  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 If she does have a good response she'll be a candidate for curative surgery. Also extensively discussed with him about the toxicities of chemo therapy which may include mild nausea vomiting alopecia myelosuppression, oxaliplatin-induced neuropathy, excessive sensitivity to cold temperature as well as occasional mucositis from 5-fluorouracil. Patient and her sister voiced her good understanding and are anxious to proceed with chemo therapy. I plan to see her back again in 10 days CBC CMP for follow-up  I also left the message with Dr. Robbie Lis to see whether he has sent specimen

## 2017-10-24 NOTE — Progress Notes
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 She also had a J-tube placed for feeding purposes as well.  Since I saw her last, patient underwent diagnostic laparoscopic conversion to open laparotomy, total gastrectomy with en bloc left lateral lobe of the liver resection with D2 lymph node dissection, feeding jejunostomy in July 30, 2017 at [facility] by Dr. Shade Flood. Patient tolerated the procedure well although developed leaking of the gastroesophagitis to ecchymosis and stayed in the hospital for 16 days.  Patient is here for follow-up, she has been doing much better able to take full liquid, she continued to use jejunostomy for feeding. She had a CT scan of the abdomen pelvis done recently showed no evidence of disease. Patient has been seen by oncologist from [facility] Dr. Genia Harold were advised her to take another 5 more treatment complete the planned 12 treatments. 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 She also had a J-tube placed for feeding purposes as well. Patient underwent laparotomy  total gastrectomy with en

## 2017-10-24 NOTE — Progress Notes
for [employer] molecular analysis if not I will send to [facility] for the same analysis  I spent more than 40 minutes including 30 minutes counseling time and answered all her questions to their satisfaction  March 07, 2017  This patient has persistent nausea vomiting and also has some obstructive symptoms from GE junction tumor, I have discussed the case with Dr. Freddrick March was suggested to have a jejunostomy tube placed for nutritional purposes. Dr. Freddrick March also talked to the patient to phone regarding the need of jejunostomy tube. Patient understand the situation and she is willing to proceed with the jejunostomy tube. Dr. Vilinda Blanks will make appropriate arrangements. I will give IV fluid today and tomorrow and advised to drink protein drinks as much she can. I also twice her that in today to have nausea vomiting she may need to go to the emergency room in May needs to be admitted to the hospital for jejunostomy tube and IV fluids. I plan to see her back again next week as planned before, I spent more than 35 minutes including 25 minutes counseling time and also discuss with Dr. Freddrick March    March 19, 2017  I'm happy to see that patient tolerated laparoscopic jejunostomy tube placement and currently tolerating the tube feeding very well. I have reviewed the labs and will proceed with second course of chemotherapy with FOLFOX at this time. I also used emend to prevent nausea so so with chemo therapy. I'm happy to see that patient has no involvement of the omentum or the peritoneum. I still believe she'll be a candidate for curative surgery if she has lost want to chemotherapy well. I will continue FOLFOX chemotherapy every 2 weeks and see her back again in 4 weeks with CBC CMP for follow-up. I spent more than 40 minutes including 30 minutes counseling time and answered all their questions to their satisfaction also reviewed the recent hospitalization for surgery as well    April 09, 2017

## 2017-10-24 NOTE — Progress Notes
proceed with surgery next month as planned. She also scheduled to have a PET CT scan next week. 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 She also had a J-tube placed for feeding purposes as well.  Since I saw her last, patient underwent diagnostic laparoscopic conversion to open laparotomy, total gastrectomy with en bloc left lateral lobe of the liver resection with D2 lymph node dissection, feeding jejunostomy in July 30, 2017 at [facility] by Dr. Shade Flood. Patient tolerated the procedure well although developed leaking of the gastroesophagitis to ecchymosis and stayed in the hospital for 16 days. She is records from that and currently using G-tube feeding. She has an appointment to have upper GI done again to see whether there is any more leakage. Patient continued to be nothing by mouth. Path report did show local infiltration to the liver which has been dissected and 10 of 16 lymph nodes were positive with extracapsular extension  Patient is doing better although feeling little bit tired and fatigue, continued to have mild abdominal discomfort, using the G-tube for feeding, she lost a few pounds but looking good. Denies any fever chills cough chest pain or shortness of breath, no headache or dizziness no bleeding problems  Interim medical history and surgical history were reviewed as above

## 2017-10-24 NOTE — Progress Notes
continue to use jejunostomy tube for feeding but she is getting stronger has mild iron deficiency anemia which is resolving. As per Dr. 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 I have discussion with Dr. Genia Harold who also felt that patient might benefit with postoperative chemo therapy and agree to hold off oxaliplatin due to neuropathy which certainly will get worse with more oxaliplatin. Dr. Genia Harold does not suggest radiation therapy at this point especially with her involvement of the liver although was resected. I had long discussion with the patient and her friend about the need of further chemotherapy and per patient request I will hold off oxaliplatin and will give 5-fluorouracil/leucovorin combination chemo therapy. Patient voiced it would understanding and agree to proceed with. I plan to see her back again 2 weeks after the first treatment. I spent more than 45 minutes including 35 minutes of counseling time answered all their questions to their satisfaction and also discussed with Dr. Genia Harold as well     October 14, 2017  I will continue chemo therapy with FOLFOX without oxaliplatin 2 weeks for total of 4 treatments. He has completed 1 without much problem except nausea. I advised her to take iron supplement once daily and plan to see her back again for weeks with CBC CMP CEA for follow-up. I spent more than 35 minutes including 25 minutes counseling time answered all her questions to her satisfaction    October 24, 2017,  Patient having a rough time tolerating the chemotherapy 5-FU leucovorin because of the nausea and vomiting, she has been taking several nausea medication also Prilosec without any improvement, I will place her on Reglan 5 mg 4 times daily to improve the motity and hopefully improve the nausea. If she continued to nausea and vomiting, I am afraid it may

## 2017-10-24 NOTE — Progress Notes
she preferred to transfer her treatment to this facility and also to the [place] and she has seen Dr. Freddrick March also as of yesterday, who recommended an MRI of the liver as well as neoadjuvant chemotherapy. I do not have results of her PET/CT scan but the patient told me verbally that this was negative for liver lesions, initially described at a recent CT scan of the upper and lower abdomen. There was no other distant metastasis but there was lymphadenopathy associated with the stomach cancer. The patient is scheduled to undergo a Mediport placement by Dr. Dara Lords as of Friday 02/22/2017. She was suggested to have FOLFOX chemotherapy as soon as possible.  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 Patient couldn't tolerate tamoxifen, which has been discontinued. Patient also BRCA 1 and 2 negative in the past  Patient is here for the first course of chemotherapy with FOLFOX. She has many questions about her disease process as well as the biopsy results and also MRI results. I have discussed this in length with the patient and felt that currently she is only as stage III disease by MRI. And as per Dr. Freddrick March, surgeons recommendation I will proceed with FOLFOX chemotherapy at this time. Patient still has mild dysphagia but able to bring labs of protein supplements. Denies any fever or chills cough or chest pain or shortness of breath. I left a message with Dr. Robbie Lis, oncologist from [facility]

## 2017-10-24 NOTE — Progress Notes
?   Lymphocytes Absolute 10/16/2017 1.95    ? Lymphocytes % 10/16/2017 36.8    ? Monocytes Absolute 10/16/2017 0.52    ? Monocytes % 10/16/2017 9.8    ? Eosinophils Absolute 10/16/2017 0.12    ? Eosinophils % 10/16/2017 2.3    ? Basophil Absolute 10/16/2017 0.02    ? BASOPHILS 10/16/2017 0.4    Orders Only on 10/02/2017   Component Date Value   ? WHITE BLOOD CELL COUNT 10/07/2017 7.9    ? RBC 10/07/2017 3.98    ? Hemoglobin 10/07/2017 10.7*   ? Hematocrit 10/07/2017 33.1*   ? MCV 10/07/2017 83.2    ? [facility] 10/07/2017 26.9*   ? MCHC 10/07/2017 32.3    ? RDW 10/07/2017 13.5    ? Platelets 10/07/2017 245    ? MPV 10/07/2017 10.9    ? Neutrophils Absolute 10/07/2017 5080    ? Lymphocytes Absolute 10/07/2017 2323    ? Monocytes Absolute 10/07/2017 182*   ? Eosinophils Absolute 10/07/2017 269    ? Basophils Absolute 10/07/2017 47    ? Neutrophils 10/07/2017 64.3    ? LYMPHOCYTES 10/07/2017 29.4    ? MONOCYTES 10/07/2017 2.3    ? EOSINOPHILS 10/07/2017 3.4    ? BASOPHILS 10/07/2017 0.6    ? CEA 10/07/2017 1.8    ? Glucose 10/07/2017 98    ? Urea Nitrogen 10/07/2017 16    ? Creatinine 10/07/2017 0.64    ? EGFR 10/07/2017 98    ? Glom Filt Rate, Est Afri* 10/07/2017 113    ? BUN/Creatinine Ratio 14/78/2956 NOT APPLICABLE    ? Sodium 10/07/2017 138    ? Potassium 10/07/2017 4.6    ? Chloride 10/07/2017 104    ? CARBON DIOXIDE 10/07/2017 29    ? Calcium 10/07/2017 9.2    ? Protein, Total 10/07/2017 6.4    ? ALBUMIN 10/07/2017 4.4    ? Globulin 10/07/2017 2.0    ? ALBUMIN/GLOBULIN RATIO 10/07/2017 2.2    ? Total Bilirubin 10/07/2017 0.2    ? Alkaline Phosphatase 10/07/2017 87    ? AST 10/07/2017 16    ? ALT 10/07/2017 22    ? LD 10/07/2017 126    ? Iron 10/07/2017 33*   ? TIBC 10/07/2017 481*   ? Iron Saturation 10/07/2017 7*   ? Ferritin 10/07/2017 12    Office Visit on 08/26/2017   Component Date Value   ? WHITE BLOOD CELL COUNT 09/09/2017 6.9    ? RBC 09/09/2017 3.83    ? Hemoglobin 09/09/2017 10.6*

## 2017-10-24 NOTE — Progress Notes
a few times. Even though she is on emend, Compazine,  Zofran, Prevacid, . Patient also was given Carafate which she cannot tolerate. She has been miserable. She has been using the J-tube constantly. He is taking of possibly quitting chemo therapy. I told her that he should try Reglan 5 mg 4 times a day if it did not work then it may be reasonable to quit further chemotherapy. I am afraid if she continued to have too much nausea and vomiting which may interfere with her surgical incisions. Patient also lost 3 pounds as well  Denies any fever chills or cough chest pain or any body ache or joint pain no bone pain  Interim medical history surgical history were reviewed no change from previous visit October 14, 2017,         Past Surgical History:   Past Surgical History:   Procedure Laterality Date   ? BREAST RECONSTRUCTION  2016   ? CATARACT EXTRACTION W/ INTRAOCULAR LENS IMPLANT     ? COLONOSCOPY W/ BIOPSY  01/28/2017   ? EGD W/ BIOPSY  01/28/2017   ? EUS UPPER W/ FNA  02/08/2017   ? HYSTERECTOMY  2003    uterine fibroids   ? LASIK     ? MASTECTOMY  2011   Diagnostic laparoscopy and laparoscopic feeding jejunostomy tube placement 03/13/2017  07/30/2017, laparotomy, total gastrectomy with en bloc left lateral lobe of the liver resection with D2 lymph node dissection, feeding jejunostomy    Social History:    Social History     Social History   ? Marital status: Single     Spouse name: N/A   ? Number of children: 0   ? Years of education: N/A     Occupational History   ? Not on file.     Social History Main Topics   ? Smoking status: Former Smoker     Packs/day: 1.00     Years: 15.00     Types: Cigarettes     Start date: 06/24/1974     Quit date: 11/29/1988   ? Smokeless tobacco: Never Used   ? Alcohol use Yes   ? Drug use: Yes     Frequency: 7.0 times per week     Types: Marijuana   ? Sexual activity: Not on file     Other Topics Concern   ? Not on file     Social History Narrative   ? No narrative on file

## 2017-10-24 NOTE — Progress Notes
Labs were reviewed today and elected to continue with chemotherapy next week she will receive the day 15 of course 2 of chemotherapy with FOLFOX. . Advised to use the jejunostomy tube frequently also flush it with water to prevent dehydration . The patient wants to she will try to drink soups as well. . I plan to see her back again in 2-3 weeks for follow-up with CBC and CMP. After 6 treatments I plan to repeat a PET CT scan for follow-up I spent more than 40 minutes including 30 minutes counseling time and answered all their questions to their satisfaction     May 06, 2017  I am very happy to see that PET/CT scan showed complete remission without any evidence of malignancy at this time. I will continue with chemo therapy preferably total of 8 treatment before undergoing surgery. After surgery will be difficult to provide adjuvant chemotherapy due to poor tolerance. But I will wait for her visit with [facility] and see what the surgeons offered there, if they want to  do the surgery after the 6 treatment, as planned before that will be okay too, then I would consider providing further adjuvant chemotherapy after the surgery even though difficult to administer due to poor tolerance. I plan to see her back again in 4 weeks with CBC CMP CEA for follow-up and also for further discussion and hopefully, she make up her mind about who should be the surgeon. I reviewed the labs and elected to continue with chemo therapy.  I spent more than 40 minutes including 30 minutes counseling time and answered all her questions to her satisfaction  May 23, 2017  Patient had good experience in [facility] and able to see oncologist Dr. Benjie Karvonen and surgeon. Patient decided to have her surgery done in [facility] which will be end of October. As preparation request I am holding one treatment this week and she will back again chemo therapy after holiday. Plan remain the same to give a course of chemotherapy and will have 4

## 2017-10-24 NOTE — Progress Notes
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 [10/ 16 ] with extracapsular extension, surgical margins are negative for carcinoma    Imaging Results:     02/14/2017, PET/CT scan showed FDG uptake in the anterior wall of the antrum consistent with malignancy, 1.87 with a lymph node within the stomach and liver, no obvious liver metastasis  02/21/2017, MRI of the abdomen there is 4-5 mm nonenhancing lesion noted within the left lobe of the liver which were present and atypical cyst or small hemangioma no obvious additional lesions are seen within the liver, there is a mass within the stomach consistent with patient's history of stomach cancer  I have reviewed these reports with the patient today March 04, 2017  EUS, staged as T3 N3, M1 although biopsy of the liver negative for malignancy as his MRI and the PET scan  04/25/2017 PET CT scan showed no evidence of residual or metastatic gastric cancer, no evidence of other malignancy, reviewed with the patient today 05/06/2017  09/12/2017 CT scan of the abdomen and pelvis with contrast, showed status post total gastrectomy no CT evidence of intra-abdominal or intrapelvic prostatic disease reviewed with the patient today and her friend September 16, 2017      Physical Exam:   Physical examination  Patient is alert, not in any acute distress , tired and fatigue, able to tolerate clear liquids , ECOG performance status?0,   HEENT, no pallor nor icterus, throat clear is a petechia, mild dehydration  Neck, no lymphadenopathy, no thyromegaly, no lymphadenopathy  Respiratory system, bilateral mild wheezing no rhonchi or vascular  Cardiac, S1-S2 regular  Abdomen, soft nontender no hepatosplenomegaly, bowel sounds are active, no other mass palpable, laparotomy incision healed well,, feeding jejunostomy in place  Musculoskeletal, no joint pain, no muscle pain, mild numbness of the feet

## 2017-10-24 NOTE — Progress Notes
EUS, HER-2/neu negative patient also had a J-tube placed for feeding purposes due to persistent nausea and vomiting,  Patient is here for follow-up, she has completed 2 courses of chemotherapy and tolerated it reasonably well although continued to have nausea. She has been using jejunostomy tube and tolerating with some eval. Although she is having some dehydration and Gave extra fluid last week. 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 Patient is been able to swallow better although continued to use jejunostomy tube for feeding. She has gained weight, feeling much better although continued Fatigue and tiredness. She has been seen at [facility] , but she would like to get second opinion from [facility] which will be next week. Patient has not decided who she wants to have surgery done although I strongly proposed that my experience with Dr. Freddrick March is remarkable that Dr. Freddrick March does minimal invasive surgery extremely well and has more experience than anybody in town. She would like to get an opinion from [facility] which she already arranged for next week. Since patient had such a good response I recommend to continue

## 2017-10-24 NOTE — Progress Notes
Family History:    Family History   Problem Relation Age of Onset   ? Breast Cancer Mother    ? Prostate Cancer Father    ? Colon Cancer Father        Vital Signs:   Vitals:    10/24/17 1124   BP: 92/61   Pulse: 60   Resp: 18   Temp: 36.9 ?C (98.4 ?F)   Weight: 58.6 kg (129 lb 1.6 oz)   Height: 1.702 m (5\' 7" )     Vitals:    10/24/17 1124   BP: 92/61   Pulse: 60   Resp: 18   Temp: 36.9 ?C (98.4 ?F)   Weight: 58.6 kg (129 lb 1.6 oz)   Height: 1.702 m (5\' 7" )       Allergies: Patient has no known allergies.    Review of Systems:    Constitutional:No more weight loss, mild fatigue, No fever, able to tolerate clear liquids  Eyes: No double vision  Ears,Nose, Mouth,Throat: No sinus trouble, No sore throat  Cardiac: No lightheadedness, No swelling in legs, No chest pain  Respiratory: No cough, No hemoptysis, No shortness of breath  Gastrointestinal:  Intermittent nausea, vomiting, abdominal pain, jejunostomy tube in place  Genitourinary: No burning on urination  Musculosketal: No bone pain.   Skin: No skin rash  Neurological: No seizures, No loss of balance, No weakness of limbs, mild numbness of both feet and hands getting slowly worse  Psychiatric: Deferred  Hematologic/Lymphatic/Immunologic: No bleeding, No lumps in arm pits  October 24, 2017, 12 system review done as above    Medications:   Current Outpatient Prescriptions:   ?  ALPRAZolam (XANAX) 0.5 MG PO Tablet, TK 1 T PO QHS, Disp: , Rfl: 2  ?  amoxicillin-clavulanate (AUGMENTIN) 400-57 MG PO Tablet Chewable, Chew 1 tablet 2 times daily for 1 day., Disp: 2 tablet, Rfl: 0  ?  amoxicillin-clavulanate (AUGMENTIN) 875-125 MG PO Tablet, every 12 hours., Disp: , Rfl: 0  ?  ARMOUR THYROID 15 MG PO Tablet, TK 3 TS PO ONCE D OES, Disp: , Rfl: 3  ?  CARAFATE 1 GM/10ML PO Suspension, TK 10 ML PO TID OES 1 HOUR B MEALS AND HS, Disp: 420 mL, Rfl: 2  ?  colesevelam (WELCHOL) 625 MG PO Tablet, , Disp: , Rfl: 6

## 2017-10-24 NOTE — Progress Notes
interfere with her  Anestamosis and  her surgical incisions. I advised her to stop chemotherapy in that case. She is willing to try Reglan, I plan to see her back again 2 weeks for follow-up  I spent more than 40 minutes including 30 minutes counseling time answered all her questions to her satisfaction  For rest of her medical problem and continue with current management and follow with her primary physician. I have encouraged her to call me directly if she has any questions or if I can be of any further assistance. Results of most recent studies discussed with patient. All questions were answered to patient's satisfaction. Patient agreeable to the above plan. More than 50% of that time was spent on coordination of care, counseling, further diagnostic and therapeutic clinic and follow-up care. This document was created using voice recognition software. Inadvertent typographical errors, word omissions and/or word substitutions may exist.         Return to Clinic:  in 2 weeks.  fax  Dr. Rosaura Carpenter,  Dr. Tamsen Meek [place]  .

## 2017-10-24 NOTE — Progress Notes
?   Hematocrit 09/09/2017 32.4*   ? MCV 09/09/2017 84.6    ? [place] 09/09/2017 27.7    ? MCHC 09/09/2017 32.7    ? RDW 09/09/2017 13.6    ? Platelets 09/09/2017 252    ? MPV 09/09/2017 11.1    ? Neutrophils Absolute 09/09/2017 3685    ? Lymphocytes Absolute 09/09/2017 2408    ? Monocytes Absolute 09/09/2017 469    ? Eosinophils Absolute 09/09/2017 290    ? Basophils Absolute 09/09/2017 48    ? Neutrophils 09/09/2017 53.4    ? LYMPHOCYTES 09/09/2017 34.9    ? MONOCYTES 09/09/2017 6.8    ? EOSINOPHILS 09/09/2017 4.2    ? BASOPHILS 09/09/2017 0.7    ? Glucose 09/09/2017 103*   ? Urea Nitrogen 09/09/2017 14    ? Creatinine 09/09/2017 0.63    ? EGFR 09/09/2017 98    ? Glom Filt Rate, Est Afri* 09/09/2017 114    ? BUN/Creatinine Ratio 34/11/5246 NOT APPLICABLE    ? Sodium 09/09/2017 142    ? Potassium 09/09/2017 4.2    ? Chloride 09/09/2017 105    ? CARBON DIOXIDE 09/09/2017 27    ? Calcium 09/09/2017 9.3    ? Protein, Total 09/09/2017 6.3    ? ALBUMIN 09/09/2017 4.1    ? Globulin 09/09/2017 2.2    ? ALBUMIN/GLOBULIN RATIO 09/09/2017 1.9    ? Total Bilirubin 09/09/2017 0.4    ? Alkaline Phosphatase 09/09/2017 94    ? AST 09/09/2017 23    ? ALT 09/09/2017 25    ? LD 09/09/2017 133    ? Ferritin 09/09/2017 12    ? Iron 09/09/2017 27*   ? TIBC 09/09/2017 448    ? Iron Saturation 09/09/2017 6*   ? CEA 09/09/2017 0.8      02/19/2017 CBC, CMP grossly normal, CEA 1.5, LDH 124  03/19/2017 CBC grossly normal except hemoglobin 11.5 CMP within normal limits, reviewed with the patient today March 19, 2017  04/02/2017 CBC grossly normal except hemoglobin 11.0 CMP grossly normal, reviewed with the patient and her sister today April 08, 2017 ,   05/01/2017 CBC grossly normal except hemoglobin 10.7, platelet 10/30/1998, CMP grossly normal except albumin 3.3, AST 103, reviewed with the patient today May 06, 2017  06/04/2017, CBC grossly normal except hemoglobin 10.1, CMP not available, B12 2000, reviewed with the patient today June 06, 2017

## 2017-10-24 NOTE — Progress Notes
Rogene Houston, [place] [address]    Chief Complaint   Patient presents with   ? Follow-up     Gastric adenocarcinoma   The patient has been referred to this clinic for workup and evaluation and treatment of adenocarcinoma of the GE junction.    Patient Name: Rosena Bartle      Date Of Birth: 01-22-1956    History of Present Illness:      Ms. 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 The patient started having severe acid reflux syndrome in November 2017 and she ended up having an endoscopy on 01/28/2017 with also endoscopic ultrasound on 02/08/2017. Results of the pathology by biopsy of a fungating mass were discussed as above. According to the patient's report the tumor was HER-2 negative. She was initially evaluated by medical oncologist at [facility] but she preferred to transfer her treatment to this facility and also to the [facility] and she has seen Dr. Freddrick March also as of yesterday, who recommended an MRI of the liver as well as neoadjuvant chemotherapy. I do not have results of her PET/CT scan but the patient told me verbally that this was negative for liver lesions, initially described at a recent CT scan of the upper and lower abdomen. There was no other distant metastasis but there was lymphadenopathy associated with the stomach cancer. The patient is scheduled to undergo a Mediport placement by Dr. Dara Lords as of Friday 02/22/2017. She was suggested to have FOLFOX chemotherapy as soon as possible.    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 She had

## 2017-10-29 DIAGNOSIS — C169 Malignant neoplasm of stomach, unspecified: Principal | ICD-10-CM

## 2017-10-30 ENCOUNTER — Inpatient Hospital Stay: Primary: Internal Medicine

## 2017-11-01 ENCOUNTER — Ambulatory Visit: Primary: Internal Medicine

## 2017-11-01 ENCOUNTER — Encounter: Primary: Internal Medicine

## 2017-11-07 ENCOUNTER — Inpatient Hospital Stay: Attending: Hematology & Oncology | Primary: Internal Medicine

## 2017-11-15 ENCOUNTER — Inpatient Hospital Stay: Admit: 2017-11-15 | Discharge: 2017-11-16 | Primary: Internal Medicine

## 2017-11-15 DIAGNOSIS — Z87891 Personal history of nicotine dependence: Secondary | ICD-10-CM

## 2017-11-15 DIAGNOSIS — C160 Malignant neoplasm of cardia: Secondary | ICD-10-CM

## 2017-11-15 DIAGNOSIS — Z79899 Other long term (current) drug therapy: Secondary | ICD-10-CM

## 2017-11-15 DIAGNOSIS — D501 Sideropenic dysphagia: Principal | ICD-10-CM

## 2017-11-15 MED ORDER — MEPERIDINE HCL 25 MG/ML IJ SOLN
25 mg | INTRAVENOUS | Status: CN | PRN
Start: 2017-11-15 — End: ?

## 2017-11-15 MED ORDER — HYDROCORTISONE NA SUCCINATE PF 100 MG IJ SOLR
100 mg | INTRAVENOUS | Status: CN | PRN
Start: 2017-11-15 — End: ?

## 2017-11-15 MED ORDER — DIPHENHYDRAMINE HCL 50 MG/ML IJ SOLN
25 mg | INTRAVENOUS | Status: CN | PRN
Start: 2017-11-15 — End: ?

## 2017-11-15 MED ORDER — SODIUM CHLORIDE FLUSH 0.9 % IV SOLN
10 mL | Status: CN | PRN
Start: 2017-11-15 — End: ?

## 2017-11-15 MED ORDER — FERRIC CARBOXYMALTOSE IVPB
750 mg | Freq: Once | INTRAVENOUS | Status: CN
Start: 2017-11-15 — End: ?

## 2017-11-15 MED ORDER — HEPARIN SODIUM LOCK FLUSH 100 UNIT/ML IV SOLN
500 [IU] | Status: DC | PRN
Start: 2017-11-15 — End: 2017-11-16

## 2017-11-15 MED ORDER — SODIUM CHLORIDE 0.9 % IV SOLN
Freq: Once | INTRAVENOUS | Status: CN
Start: 2017-11-15 — End: ?

## 2017-11-15 MED ORDER — FERRIC CARBOXYMALTOSE IVPB
750 mg | Freq: Once | INTRAVENOUS | Status: CP
Start: 2017-11-15 — End: ?

## 2017-11-15 MED ORDER — EPINEPHRINE 0.3 MG/0.3ML IJ SOAJ
0.3 mg | INTRAMUSCULAR | Status: CN | PRN
Start: 2017-11-15 — End: ?

## 2017-11-15 MED ORDER — HEPARIN SODIUM LOCK FLUSH 100 UNIT/ML IV SOLN
500 [IU] | Status: CN | PRN
Start: 2017-11-15 — End: ?

## 2017-11-15 MED ORDER — DEXAMETHASONE SODIUM PHOSPHATE 20 MG/5ML IJ SOLN
20 mg | INTRAVENOUS | Status: CN | PRN
Start: 2017-11-15 — End: ?

## 2017-11-15 MED ORDER — SODIUM CHLORIDE 0.9 % IV SOLN
Freq: Once | INTRAVENOUS | Status: CP
Start: 2017-11-15 — End: ?

## 2017-11-22 ENCOUNTER — Inpatient Hospital Stay: Admit: 2017-11-22 | Discharge: 2017-11-23 | Primary: Internal Medicine

## 2017-11-22 DIAGNOSIS — Z79899 Other long term (current) drug therapy: Secondary | ICD-10-CM

## 2017-11-22 DIAGNOSIS — D501 Sideropenic dysphagia: Secondary | ICD-10-CM

## 2017-11-22 DIAGNOSIS — C160 Malignant neoplasm of cardia: Principal | ICD-10-CM

## 2017-11-22 MED ORDER — HEPARIN SODIUM LOCK FLUSH 100 UNIT/ML IV SOLN
500 [IU] | Status: DC | PRN
Start: 2017-11-22 — End: 2017-11-23

## 2017-11-22 MED ORDER — EPINEPHRINE 0.3 MG/0.3ML IJ SOAJ
0.3 mg | INTRAMUSCULAR | Status: CN | PRN
Start: 2017-11-22 — End: ?

## 2017-11-22 MED ORDER — HYDROCORTISONE NA SUCCINATE PF 100 MG IJ SOLR
100 mg | INTRAVENOUS | Status: CN | PRN
Start: 2017-11-22 — End: ?

## 2017-11-22 MED ORDER — DEXAMETHASONE SODIUM PHOSPHATE 20 MG/5ML IJ SOLN
20 mg | INTRAVENOUS | Status: CN | PRN
Start: 2017-11-22 — End: ?

## 2017-11-22 MED ORDER — SODIUM CHLORIDE 0.9 % IV SOLN
Freq: Once | INTRAVENOUS | Status: CP
Start: 2017-11-22 — End: ?

## 2017-11-22 MED ORDER — HEPARIN SODIUM LOCK FLUSH 100 UNIT/ML IV SOLN
500 [IU] | Status: CN | PRN
Start: 2017-11-22 — End: ?

## 2017-11-22 MED ORDER — DIPHENHYDRAMINE HCL 50 MG/ML IJ SOLN
25 mg | INTRAVENOUS | Status: CN | PRN
Start: 2017-11-22 — End: ?

## 2017-11-22 MED ORDER — SODIUM CHLORIDE FLUSH 0.9 % IV SOLN
10 mL | Status: DC | PRN
Start: 2017-11-22 — End: 2017-11-23

## 2017-11-22 MED ORDER — SODIUM CHLORIDE FLUSH 0.9 % IV SOLN
10 mL | Status: CN | PRN
Start: 2017-11-22 — End: ?

## 2017-11-22 MED ORDER — FERRIC CARBOXYMALTOSE IVPB
750 mg | Freq: Once | INTRAVENOUS | Status: CN
Start: 2017-11-22 — End: ?

## 2017-11-22 MED ORDER — MEPERIDINE HCL 25 MG/ML IJ SOLN
25 mg | INTRAVENOUS | Status: CN | PRN
Start: 2017-11-22 — End: ?

## 2017-11-22 MED ORDER — SODIUM CHLORIDE 0.9 % IV SOLN
Freq: Once | INTRAVENOUS | Status: CN
Start: 2017-11-22 — End: ?

## 2017-11-22 MED ORDER — FERRIC CARBOXYMALTOSE IVPB
750 mg | Freq: Once | INTRAVENOUS | Status: CP
Start: 2017-11-22 — End: ?

## 2017-12-10 ENCOUNTER — Ambulatory Visit: Admit: 2017-12-10 | Discharge: 2017-12-10 | Attending: Hematology & Oncology | Primary: Internal Medicine

## 2017-12-10 DIAGNOSIS — R634 Abnormal weight loss: Secondary | ICD-10-CM

## 2017-12-10 DIAGNOSIS — Z9221 Personal history of antineoplastic chemotherapy: Secondary | ICD-10-CM

## 2017-12-10 DIAGNOSIS — D501 Sideropenic dysphagia: Secondary | ICD-10-CM

## 2017-12-10 DIAGNOSIS — C169 Malignant neoplasm of stomach, unspecified: Principal | ICD-10-CM

## 2017-12-10 DIAGNOSIS — M1990 Unspecified osteoarthritis, unspecified site: Secondary | ICD-10-CM

## 2017-12-10 DIAGNOSIS — R1013 Epigastric pain: Secondary | ICD-10-CM

## 2017-12-10 DIAGNOSIS — E039 Hypothyroidism, unspecified: Secondary | ICD-10-CM

## 2017-12-10 DIAGNOSIS — Z9884 Bariatric surgery status: Secondary | ICD-10-CM

## 2017-12-10 DIAGNOSIS — K219 Gastro-esophageal reflux disease without esophagitis: Principal | ICD-10-CM

## 2017-12-10 DIAGNOSIS — D219 Benign neoplasm of connective and other soft tissue, unspecified: Secondary | ICD-10-CM

## 2017-12-10 DIAGNOSIS — C50911 Malignant neoplasm of unspecified site of right female breast: Secondary | ICD-10-CM

## 2017-12-10 DIAGNOSIS — R1310 Dysphagia, unspecified: Secondary | ICD-10-CM

## 2017-12-10 DIAGNOSIS — F419 Anxiety disorder, unspecified: Secondary | ICD-10-CM

## 2017-12-10 DIAGNOSIS — Z79899 Other long term (current) drug therapy: Secondary | ICD-10-CM

## 2017-12-10 DIAGNOSIS — M810 Age-related osteoporosis without current pathological fracture: Secondary | ICD-10-CM

## 2017-12-10 DIAGNOSIS — R9389 Abnormal findings on diagnostic imaging of other specified body structures: Secondary | ICD-10-CM

## 2017-12-10 DIAGNOSIS — K769 Liver disease, unspecified: Secondary | ICD-10-CM

## 2017-12-10 DIAGNOSIS — C787 Secondary malignant neoplasm of liver and intrahepatic bile duct: Secondary | ICD-10-CM

## 2017-12-10 DIAGNOSIS — Z87891 Personal history of nicotine dependence: Secondary | ICD-10-CM

## 2017-12-10 DIAGNOSIS — G8929 Other chronic pain: Secondary | ICD-10-CM

## 2017-12-10 MED ORDER — MIRTAZAPINE 15 MG PO TABS
15 mg | Freq: Every evening | ORAL | 6 refills | Status: CP
Start: 2017-12-10 — End: ?

## 2017-12-10 MED ORDER — SODIUM CHLORIDE 0.9 % IV SOLN
Freq: Once | INTRAVENOUS | Status: CN
Start: 2017-12-10 — End: ?

## 2017-12-10 MED ORDER — DIPHENHYDRAMINE HCL 50 MG/ML IJ SOLN
25 mg | INTRAVENOUS | Status: CN | PRN
Start: 2017-12-10 — End: ?

## 2017-12-10 MED ORDER — ACETAMINOPHEN 325 MG PO TABS
650 mg | Freq: Once | ORAL | Status: CN
Start: 2017-12-10 — End: ?

## 2017-12-10 MED ORDER — HYDROCORTISONE NA SUCCINATE PF 100 MG IJ SOLR
100 mg | INTRAVENOUS | Status: CN | PRN
Start: 2017-12-10 — End: ?

## 2017-12-10 MED ORDER — DEXAMETHASONE SODIUM PHOSPHATE 20 MG/5ML IJ SOLN
20 mg | INTRAVENOUS | Status: CN | PRN
Start: 2017-12-10 — End: ?

## 2017-12-10 MED ORDER — MEDICAL MARIJUANA
600 mg | ORAL
Start: 2017-12-10 — End: 2018-01-30

## 2017-12-10 MED ORDER — MEPERIDINE HCL 25 MG/ML IJ SOLN
25 mg | INTRAVENOUS | Status: CN | PRN
Start: 2017-12-10 — End: ?

## 2017-12-10 MED ORDER — DIPHENHYDRAMINE IVPB JX
50 mg | Freq: Once | INTRAVENOUS | Status: CN
Start: 2017-12-10 — End: ?

## 2017-12-10 MED ORDER — SODIUM CHLORIDE FLUSH 0.9 % IV SOLN
10 mL | Status: CN | PRN
Start: 2017-12-10 — End: ?

## 2017-12-10 MED ORDER — HEPARIN SODIUM LOCK FLUSH 100 UNIT/ML IV SOLN
500 [IU] | Status: CN | PRN
Start: 2017-12-10 — End: ?

## 2017-12-10 MED ORDER — RAMUCIRUMAB (CYRAMZA) INFUSION BCN JX
8 mg/kg | Freq: Once | INTRAVENOUS | Status: CN
Start: 2017-12-10 — End: ?

## 2017-12-10 NOTE — Progress Notes
she preferred to transfer her treatment to this facility and also to the [place] and she has seen Dr. Freddrick March also as of yesterday, who recommended an MRI of the liver as well as neoadjuvant chemotherapy. I do not have results of her PET/CT scan but the patient told me verbally that this was negative for liver lesions, initially described at a recent CT scan of the upper and lower abdomen. There was no other distant metastasis but there was lymphadenopathy associated with the stomach cancer. The patient is scheduled to undergo a Mediport placement by Dr. Dara Lords as of Friday 02/22/2017. She was suggested to have FOLFOX chemotherapy as soon as possible.  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 Patient couldn't tolerate tamoxifen, which has been discontinued. Patient also BRCA 1 and 2 negative in the past  Patient is here for the first course of chemotherapy with FOLFOX. She has many questions about her disease process as well as the biopsy results and also MRI results. I have discussed this in length with the patient and felt that currently she is only as stage III disease by MRI. And as per Dr. Freddrick March, surgeons recommendation I will proceed with FOLFOX chemotherapy at this time. Patient still has mild dysphagia but able to bring labs of protein supplements. Denies any fever or chills cough or chest pain or shortness of breath. I left a message with Dr. Robbie Lis, oncologist from [facility]

## 2017-12-10 NOTE — Progress Notes
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 She also had a J-tube placed for feeding purposes as well.  Since I saw her last, patient underwent diagnostic laparoscopic conversion to open laparotomy, total gastrectomy with en bloc left lateral lobe of the liver resection with D2 lymph node dissection, feeding jejunostomy in July 30, 2017 at [facility] by Dr. Shade Flood. Patient tolerated the procedure well although developed leaking of the gastroesophagitis to ecchymosis and stayed in the hospital for 16 days.  Patient is here for follow-up, she has been doing much better able to take full liquid, she continued to use jejunostomy for feeding. She had a CT scan of the abdomen pelvis done recently showed no evidence of disease. Patient has been seen by oncologist from [facility] Dr. Genia Harold were advised her to take another 5 more treatment complete the planned 12 treatments. 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 She also had a J-tube placed for feeding purposes as well. Patient underwent laparotomy  total gastrectomy with en

## 2017-12-10 NOTE — Progress Notes
surgeons offered there, if they want to  do the surgery after the 6 treatment, as planned before that will be okay too, then I would consider providing further adjuvant chemotherapy after the surgery even though difficult to administer due to poor tolerance. I plan to see her back again in 4 weeks with CBC CMP CEA for follow-up and also for further discussion and hopefully, she make up her mind about who should be the surgeon. I reviewed the labs and elected to continue with chemo therapy.  I spent more than 40 minutes including 30 minutes counseling time and answered all her questions to her satisfaction  May 23, 2017  Patient had good experience in [facility] and able to see oncologist Dr. Benjie Karvonen and surgeon. Patient decided to have her surgery done in [facility] which will be end of October. As preparation request I am holding one treatment this week and she will back again chemo therapy after holiday. Plan remain the same to give a course of chemotherapy and will have 4 weeks break before undergoing surgery. Patient tells me that Dr. Benjie Karvonen suggested to give him 12 treatments before the surgery but when I talked to Dr. Benjie Karvonen today  his recommendation remain the same to have the surgery after the 8 treatment and following surgery if feasible will give 4 more course of chemo therapy. I plan to see her back again in 2 weeks for follow-up as preparation request. I spent more than 35 minutes including 35 minutes counseling time answered all her questions to her satisfaction. I also discuss with Dr.Mody from [facility] as well  June 06, 2017  As per my previous discussion with the patient and Dr. Genia Harold from [facility] it was decided to complete 8 treatment with FOLFOX which will complete in October 4 and surgery has been scheduled end of October and [facility] as per patient request. Patient already seen the surgeon and surgery schedule has been made. I will continue with chemo therapy every 2

## 2017-12-10 NOTE — Progress Notes
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  Patient is here complains of increasing nausea and vomiting, not able to keep anything down., She is having obstructive symptoms., She had fluids yesterday as well. Denies any fever or chills or cough or chest pain feeling very tired and fatigued lost 2-3 pounds last few days, I have discussed with Dr. 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 Patient denies any fever or chills cough chest pain or shortness of breath. Eating only very minimal using the J-tube. 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

## 2017-12-10 NOTE — Progress Notes
weeks and as I mentioned it will be finished by October 4. I advised her to take a multivitamin with iron supplement once daily to improve the anemia. Encouraged to eat better and gain stream or weight before the surgery after the surgery she might lose a few pounds. I plan to see her back again 4 weeks with CBC CMP and LDH for follow-up. Patient also scheduled PET/CT scan in couple weeks. I spent more than 35 minutes including 25 minutes counseling time and answered all her questions to her satisfaction    June 27, 2017   Since patient already started developing peripheral neuropathy had completed 7 treatments and also scheduled for total gastrectomy in 4 weeks, I believe it is reasonable to hold the 8 treatment next week as requested by the patient so that she can physically getting ready for the surgery. I had a long discussion with the patient about the need of surgery for curative intent. And also made available to her to discuss surgery in postoperative complications from any with one of my patient who had synchronous surgery almost a year ago and recovered very quickly and also completed postoperative chemotherapy. Mrs. Bosler is able to talk to my patient Mrs. Covie extensively and decided to proceed with surgery next month and [facility]. I also advised her to keep the appointment for the PET scan next couple weeks and I plan to see her back again in 3-4 weeks with a CBC CMP for follow-up. I spent more than 45 minutes including 35 minutes counseling time and answered all her questions to her satisfaction     August 26, 2017   Although patient had R0 resection, she did have local extension to the liver which has been resected, 10 out of 16 lymph node positive, she is in high risk for recurrence and metastasis. 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

## 2017-12-10 NOTE — Progress Notes
EUS, HER-2/neu negative patient also had a J-tube placed for feeding purposes due to persistent nausea and vomiting,  Patient is here for follow-up, she has completed 2 courses of chemotherapy and tolerated it reasonably well although continued to have nausea. She has been using jejunostomy tube and tolerating with some eval. Although she is having some dehydration and Gave extra fluid last week. 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 Patient is been able to swallow better although continued to use jejunostomy tube for feeding. She has gained weight, feeling much better although continued Fatigue and tiredness. She has been seen at [facility] , but she would like to get second opinion from [facility] which will be next week. Patient has not decided who she wants to have surgery done although I strongly proposed that my experience with Dr. Freddrick March is remarkable that Dr. Freddrick March does minimal invasive surgery extremely well and has more experience than anybody in town. She would like to get an opinion from [facility] which she already arranged for next week. Since patient had such a good response I recommend to continue

## 2017-12-10 NOTE — Progress Notes
treatment with FOLFOX and tolerated very well except mild numbness of hands and feet lasting for few days. She is gaining weight continue to use J-tube and also able to eat regular food. Patient is been scheduled to have surgery and of October. Plan is to complete chemotherapy 4 weeks before the surgery. I have discussed the case with Dr. Genia Harold from [facility] who agree about giving total 8 courses of FOLFOX before surgery and then depending on pathology, we may consider postoperative chemotherapy if she has persistent tumor. 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 She also had a J-tube placed for feeding purposes as well.  , Patient has completed 7 treatment with FOLFOX chemotherapy and continued to be in excellent remission. She has been scheduled Surgery at [facility] and of October. She started having some numbness in the hands and feet and also had difficulty recovering from the last chemotherapy she would like to skip day 8 treatment in preparation for the surgery I believe it is reasonable to do that so that she will be in a better shape for the proposed total gastrectomy. Patient does have some reservations about the surgery after talking few people at surgery and had problems. I have her talk to one of my patient Ms. Covie who had the same surgical procedure done by Dr. Freddrick March last year and recovered very well and back to work now. Mr. Kaczmarczyk talk to Mrs. Covie  for a while and decided to

## 2017-12-10 NOTE — Progress Notes
?    levoFLOXacin (LEVAQUIN) 500 MG PO Tablet, Take 1 tablet by mouth daily for 7 days., Disp: 7 tablet, Rfl: 0  ?  lidocaine-prilocaine (EMLA) 2.5-2.5 % EX Cream, Apply 30 minutes prior to treatment, Disp: 60 g, Rfl: 1  ?  metoclopramide (REGLAN) 5 MG/5ML PO Solution liquid, Take 5 mLs by mouth 4 times daily (before meals and nightly)., Disp: 480 mL, Rfl: 4  ?  mirtazapine (REMERON) 15 MG PO Tablet, Take 1 tablet by mouth nightly at bedtime., Disp: 30 tablet, Rfl: 6  ?  omeprazole (PriLOSEC) 40 MG PO Capsule Delayed Release, TK 1 C PO QD AC, Disp: , Rfl: 5  ?  ondansetron (ZOFRAN-ODT) 4 MG PO Tablet Disintegrating, , Disp: , Rfl: 3  ?  prochlorperazine (COMPAZINE) 10 MG PO Tablet, Take 1 tablet by mouth every 6 hours as needed., Disp: 60 tablet, Rfl: 11  ?  promethazine (PHENERGAN) 50 MG PO Tablet, TK 1/2 T PO Q 12 H PRN, Disp: , Rfl: 0  ?  QUEtiapine (SEROquel) 50 MG PO Tablet, , Disp: , Rfl: 0  ?  Senna 8.8 MG/5ML PO Syrup, Take 17.6 mg by mouth., Disp: , Rfl:       Labs:   Hospital Outpatient Visit on 10/16/2017   Component Date Value   ? WBC 10/16/2017 5.30    ? RBC 10/16/2017 3.45    ? Hemoglobin 10/16/2017 9.4*   ? Hematocrit 10/16/2017 29.2*   ? MCV 10/16/2017 84.6    ? [facility] 10/16/2017 27.2    ? MCHC 10/16/2017 32.2    ? RDW 10/16/2017 14.2    ? Platelet Count 10/16/2017 180    ? MPV 10/16/2017 10.3    ? Neutrophils Absolute 10/16/2017 2.69    ? Neutrophils % 10/16/2017 50.7    ? Lymphocytes Absolute 10/16/2017 1.95    ? Lymphocytes % 10/16/2017 36.8    ? Monocytes Absolute 10/16/2017 0.52    ? Monocytes % 10/16/2017 9.8    ? Eosinophils Absolute 10/16/2017 0.12    ? Eosinophils % 10/16/2017 2.3    ? Basophil Absolute 10/16/2017 0.02    ? BASOPHILS 10/16/2017 0.4      02/19/2017 CBC, CMP grossly normal, CEA 1.5, LDH 124  03/19/2017 CBC grossly normal except hemoglobin 11.5 CMP within normal limits, reviewed with the patient today March 19, 2017

## 2017-12-10 NOTE — Progress Notes
meantime her choices are using second line chemotherapy with Ramicirumab/Taxol or Ramicirumab alone. Second choice will be to use Keytruda but has not been approved at this time as a second line but been considered as third of fourth line. I will discussed with her about the toxicities of chemo therapy which may include mild nausea and occasional vomiting, Taxotere induced neuropathy, and mild pancytopenia occasional allergic reaction, Ramicirumab could cause occasional hypertension, and thromboembolic phenomena. Patient and her sister voiced it would understanding and tentatively agree to proceed with chemo therapy in next 2 weeks. Hopefully we will can get biopsy done before that. I also prescribed Remeron 15 mg to be taken at night for sleep and also as an antidepressant. I plan to see her back again 2 weeks with CBC CMP, CEA for follow-up. I spent more than 45 minutes including 35 minutes counseling time answered all her questions to her satisfaction   For rest of her medical problem and continue with current management and follow with her primary physician. I have encouraged her to call me directly if she has any questions or if I can be of any further assistance. Results of most recent studies discussed with patient. All questions were answered to patient's satisfaction. Patient agreeable to the above plan. More than 50% of that time was spent on coordination of care, counseling, further diagnostic and therapeutic clinic and follow-up care. This document was created using voice recognition software. Inadvertent typographical errors, word omissions and/or word substitutions may exist.         Return to Clinic:  in 2 weeks.  fax  Dr. Rosaura Carpenter,  Dr. Tamsen Meek [place]  .

## 2017-12-10 NOTE — Progress Notes
Rogene Houston, [place] [address]    Chief Complaint   Patient presents with   ? Follow-up     Gastric adenocarcinoma    The patient has been referred to this clinic for workup and evaluation and treatment of adenocarcinoma of the GE junction.    Patient Name: Mariah Cameron      Date Of Birth: March 21, 1956    History of Present Illness:      Ms. 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 The patient started having severe acid reflux syndrome in November 2017 and she ended up having an endoscopy on 01/28/2017 with also endoscopic ultrasound on 02/08/2017. Results of the pathology by biopsy of a fungating mass were discussed as above. According to the patient's report the tumor was HER-2 negative. She was initially evaluated by medical oncologist at [facility] [place] but she preferred to transfer her treatment to this facility and also to the [place] and she has seen Dr. Freddrick March also as of yesterday, who recommended an MRI of the liver as well as neoadjuvant chemotherapy. I do not have results of her PET/CT scan but the patient told me verbally that this was negative for liver lesions, initially described at a recent CT scan of the upper and lower abdomen. There was no other distant metastasis but there was lymphadenopathy associated with the stomach cancer. The patient is scheduled to undergo a Mediport placement by Dr. Dara Lords as of Friday 02/22/2017. She was suggested to have FOLFOX chemotherapy as soon as possible.    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 She had

## 2017-12-10 NOTE — Progress Notes
She is very much concerned about PET scan finding.  Interim medical history surgical history were reviewed no change from previous visit January 2019         Past Surgical History:   Past Surgical History:   Procedure Laterality Date   ? BREAST RECONSTRUCTION  2016   ? CATARACT EXTRACTION W/ INTRAOCULAR LENS IMPLANT     ? COLONOSCOPY W/ BIOPSY  01/28/2017   ? EGD W/ BIOPSY  01/28/2017   ? EUS UPPER W/ FNA  02/08/2017   ? HYSTERECTOMY  2003    uterine fibroids   ? LASIK     ? MASTECTOMY  2011   Diagnostic laparoscopy and laparoscopic feeding jejunostomy tube placement 03/13/2017  07/30/2017, laparotomy, total gastrectomy with en bloc left lateral lobe of the liver resection with D2 lymph node dissection, feeding jejunostomy    Social History:    Social History     Social History   ? Marital status: Single     Spouse name: N/A   ? Number of children: 0   ? Years of education: N/A     Occupational History   ? Not on file.     Social History Main Topics   ? Smoking status: Former Smoker     Packs/day: 1.00     Years: 15.00     Types: Cigarettes     Start date: 06/24/1974     Quit date: 11/29/1988   ? Smokeless tobacco: Never Used   ? Alcohol use Yes   ? Drug use: Yes     Frequency: 7.0 times per week     Types: Marijuana   ? Sexual activity: Not on file     Other Topics Concern   ? Not on file     Social History Narrative   ? No narrative on file       Family History:    Family History   Problem Relation Age of Onset   ? Breast Cancer Mother    ? Prostate Cancer Father    ? Colon Cancer Father        Vital Signs:   Vitals:    12/10/17 1130   BP: 98/63   Pulse: 56   Resp: 18   Temp: 36.5 ?C (97.7 ?F)   Weight: 56.9 kg (125 lb 6.4 oz)   Height: 1.702 m (5\' 7" )     Vitals:    12/10/17 1130   BP: 98/63   Pulse: 56   Resp: 18   Temp: 36.5 ?C (97.7 ?F)   Weight: 56.9 kg (125 lb 6.4 oz)   Height: 1.702 m (5\' 7" )       Allergies: Bee venom    Review of Systems:December 10, 2017

## 2017-12-10 NOTE — Progress Notes
proceed with surgery next month as planned. She also scheduled to have a PET CT scan next week. 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 She also had a J-tube placed for feeding purposes as well.  Since I saw her last, patient underwent diagnostic laparoscopic conversion to open laparotomy, total gastrectomy with en bloc left lateral lobe of the liver resection with D2 lymph node dissection, feeding jejunostomy in July 30, 2017 at [facility] by Dr. Shade Flood. Patient tolerated the procedure well although developed leaking of the gastroesophagitis to ecchymosis and stayed in the hospital for 16 days. She is records from that and currently using G-tube feeding. She has an appointment to have upper GI done again to see whether there is any more leakage. Patient continued to be nothing by mouth. Path report did show local infiltration to the liver which has been dissected and 10 of 16 lymph nodes were positive with extracapsular extension  Patient is doing better although feeling little bit tired and fatigue, continued to have mild abdominal discomfort, using the G-tube for feeding, she lost a few pounds but looking good. Denies any fever chills cough chest pain or shortness of breath, no headache or dizziness no bleeding problems  Interim medical history and surgical history were reviewed as above

## 2017-12-10 NOTE — Progress Notes
04/02/2017 CBC grossly normal except hemoglobin 11.0 CMP grossly normal, reviewed with the patient and her sister today April 08, 2017 ,   05/01/2017 CBC grossly normal except hemoglobin 10.7, platelet 10/30/1998, CMP grossly normal except albumin 3.3, AST 103, reviewed with the patient today May 06, 2017  06/04/2017, CBC grossly normal except hemoglobin 10.1, CMP not available, B12 2000, reviewed with the patient today June 06, 2017  06/18/2017 CBC grossly normal except hemoglobin 10.8, CMP within normal limits reviewed with the patient and her friend today June 27, 2014  08/20/2017 CBC grossly normal except hemoglobin 11.0, CMP within normal limit except alkaline phosphatase 166, T3-T4 TSH, normal reviewed with the patient today August 26, 2017  09/09/2017 CBC grossly normal except hemoglobin 10.6, CMP within normal limit, LDH 133, iron studies showed iron saturation 6%, ferritin 12, CEA 0.8, T3-T4 TSH normal, reviewed with the patient today September 16, 2017  10/07/2016, CBC grossly normal except hemoglobin 10.7, CMP within normal limit, iron studies consistent with mild iron deficiency iron saturation 7%, ferritin 12, LDH 126, CEA 1.8, reviewed with the patient today in October 14, 2017  10/16/2017 CBC grossly normal except hemoglobin 9.4, iron studies consistent with iron deficiency including iron saturation 6 ferritin 12, , reviewed with the patient today October 24, 2016      5/4/2018A. 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 SEE COMMENT    B. ANTRUM (ENDOSCOPIC BIOPSY):   -  MILD CHRONIC GASTRITIS WITH MINIMAL ACTIVITY.   -  NO HELICOBACTER PYLORI IDENTIFIED BY MORPHOLOGICAL EVALUATION.   -  NO INTESTINAL METAPLASIA, DYSPLASIA OR MALIGNANCY IDENTIFIED.    C. POLYP DESCENDING (ENDOSCOPIC BIOPSY):   -  TUBULAR ADENOMA   -  NEGATIVE FOR HIGH-GRADE DYSPLASIA AND MALIGNANCY  HER2 Non-Breast: NEGATIVE  Score: 1+

## 2017-12-10 NOTE — Progress Notes
postop chemoradiation therapy with 5 fluorouracil or further chemo therapy alone, I would like to get opinion from Dr. Genia Harold regarding this matter and advised to make an appointment in next 2 weeks with Dr. Genia Harold. Hopefully her postoperative leak will be resolved so on and able to start taking oral nourishment. I had long discussion with the patient about weight is options and also her prognosis would be better due to surgery, although long-term survival is not conceivable . Patient no stable understanding and achy to obtain opinion from Dr. Genia Harold and keep her appointment with her surgeon as well. I plan to see her back again 3 weeks with CBC CMP CEA, iron studies for follow-up. I spent more than 45 minutes including 35 minutes counseling time, also reviewed the medical records from [facility] including pathology report. And answered all her questions to her satisfaction     September 16, 2017  Patient is been able to tolerate clear liquids without any problem, continue to use jejunostomy tube for feeding but she is getting stronger has mild iron deficiency anemia which is resolving. As per Dr. 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 I have discussion with Dr. Genia Harold who also felt that patient might benefit with postoperative chemo therapy and agree to hold off oxaliplatin due to neuropathy which certainly will get worse with more oxaliplatin. Dr. Genia Harold does not suggest radiation therapy at this point especially with her involvement of the liver although was resected. I had long discussion with the patient and her friend about the need of further chemotherapy and per patient request I will hold off oxaliplatin and will give 5-fluorouracil/leucovorin combination chemo therapy. Patient voiced it would understanding and agree to proceed with. I plan to see her back again 2 weeks after the first

## 2017-12-10 NOTE — Progress Notes
see her back again next week as planned before, I spent more than 35 minutes including 25 minutes counseling time and also discuss with Dr. Freddrick March    March 19, 2017  I'm happy to see that patient tolerated laparoscopic jejunostomy tube placement and currently tolerating the tube feeding very well. I have reviewed the labs and will proceed with second course of chemotherapy with FOLFOX at this time. I also used emend to prevent nausea so so with chemo therapy. I'm happy to see that patient has no involvement of the omentum or the peritoneum. I still believe she'll be a candidate for curative surgery if she has lost want to chemotherapy well. I will continue FOLFOX chemotherapy every 2 weeks and see her back again in 4 weeks with CBC CMP for follow-up. I spent more than 40 minutes including 30 minutes counseling time and answered all their questions to their satisfaction also reviewed the recent hospitalization for surgery as well    April 09, 2017   Labs were reviewed today and elected to continue with chemotherapy next week she will receive the day 15 of course 2 of chemotherapy with FOLFOX. . Advised to use the jejunostomy tube frequently also flush it with water to prevent dehydration . The patient wants to she will try to drink soups as well. . I plan to see her back again in 2-3 weeks for follow-up with CBC and CMP. After 6 treatments I plan to repeat a PET CT scan for follow-up I spent more than 40 minutes including 30 minutes counseling time and answered all their questions to their satisfaction     May 06, 2017  I am very happy to see that PET/CT scan showed complete remission without any evidence of malignancy at this time. I will continue with chemo therapy preferably total of 8 treatment before undergoing surgery. After surgery will be difficult to provide adjuvant chemotherapy due to poor tolerance. But I will wait for her visit with [facility] and see what the

## 2017-12-10 NOTE — Progress Notes
02/08/2017. LIVER (FINE NEEDLE ASPIRATION):   -  NO EVIDENCE OF MALIGNANCY IDENTIFIED.    -  SMALL DETACHED FRAGMENTS OF LIVER PARENCHYMA AND SUPERFICIAL GASTRIC        FOVEOLAR EPITHELIUM.    07/30/2017 total gastrectomy/ en bloc left lateral lobe liver resection with D2 lymph node dissection  Residual adenocarcinoma located in the gastric cardia and body with involvement of the gastroesophageal junction and direct invasion of the 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 [10/ 16 ] with extracapsular extension, surgical margins are negative for carcinoma    Imaging Results:     02/14/2017, PET/CT scan showed FDG uptake in the anterior wall of the antrum consistent with malignancy, 1.87 with a lymph node within the stomach and liver, no obvious liver metastasis  02/21/2017, MRI of the abdomen there is 4-5 mm nonenhancing lesion noted within the left lobe of the liver which were present and atypical cyst or small hemangioma no obvious additional lesions are seen within the liver, there is a mass within the stomach consistent with patient's history of stomach cancer  I have reviewed these reports with the patient today March 04, 2017  EUS, staged as T3 N3, M1 although biopsy of the liver negative for malignancy as his MRI and the PET scan  04/25/2017 PET CT scan showed no evidence of residual or metastatic gastric cancer, no evidence of other malignancy, reviewed with the patient today 05/06/2017  09/12/2017 CT scan of the abdomen and pelvis with contrast, showed status post total gastrectomy no CT evidence of intra-abdominal or intrapelvic prostatic disease reviewed with the patient today and her friend September 16, 2017  12/05/2017  12/05/2017, PET/CT scan showed no evidence of recurrent or metastatic gastric cancer  12/06/2017 CT scan of the abdominal and pelvis showed there are scattered

## 2017-12-10 NOTE — Progress Notes
type of tumor. I explained in great detail the effects and side effects of chemotherapy especially neuropathy with oxaliplatin possible chest pain or discomfort with 5-FU, less of alopecia and no major other side effects. In general this tablet treatment is relatively well tolerated without excessive cytopenias. I prescribed antinausea medications in case of chemotherapy associated nausea and vomiting, to her local pharmacy. We did add toward with the patient and the sister of our chemotherapy unit and introduce her to our chemotherapy nurses. I have prescribed chemotherapy to start tentatively on Wednesday, Feb 27, 2017., Achieved complete remission by PET CT scan 04/25/2017    Given her heavy family history of breast cancer, colon cancer, prostate cancer, I have requested a genetic evaluation testing by myriad laboratories. At [facility] testing was requested and results need to be followed for molecular biology of the tumor to include MSI status or other clinically relevant molecular markers.    The patient will return to this clinic for follow-up, most probable with Dr. Lurena Joiner, in 2 weeks after her first round of chemotherapy to assess for toxicity and possible side effects.    The patient and her sister had a long list of questions regarding diagnosis, treatment, sequencing of multi modality treatments, side effects of chemotherapy and this questions were answered to the best of my knowledge during this initial evaluation.    It is of note is that the knee CT scan of the chest abdomen and pelvis from Feb 05, 2017 to indeterminate subcentimeter low-density lesions were noted within the lateral segment of the left hepatic lobe. These were biopsied by fine-needle aspiration at [facility] and were negative for malignancy. Most recent PET CT scan at Dr. Dario [facility] facility did not confirm liver metastasis.

## 2017-12-10 NOTE — Progress Notes
treatment. I spent more than 45 minutes including 35 minutes of counseling time answered all their questions to their satisfaction and also discussed with Dr. Genia Harold as well     October 14, 2017  I will continue chemo therapy with FOLFOX without oxaliplatin 2 weeks for total of 4 treatments. He has completed 1 without much problem except nausea. I advised her to take iron supplement once daily and plan to see her back again for weeks with CBC CMP CEA for follow-up. I spent more than 35 minutes including 25 minutes counseling time answered all her questions to her satisfaction    October 24, 2017,  Patient having a rough time tolerating the chemotherapy 5-FU leucovorin because of the nausea and vomiting, she has been taking several nausea medication also Prilosec without any improvement, I will place her on Reglan 5 mg 4 times daily to improve the motity and hopefully improve the nausea. If she continued to nausea and vomiting, I am afraid it may interfere with her  Anestamosis and  her surgical incisions. I advised her to stop chemotherapy in that case. She is willing to try Reglan, I plan to see her back again 2 weeks for follow-up  I spent more than 40 minutes including 30 minutes counseling time answered all her questions to her satisfaction    December 10, 2017   It is unfortunate that recent Ct scan showing multiple liver metastasis, I had long discussion with the patient and her sister regarding the PET scan finding and fact that she has metastatic disease and call of further treatments are mainly for palliation. Patient next to keep good performance status and do not want to consider chemotherapy if it affects her performance status. I told her that if will be difficult to predict but usually chemo therapy will keep her performance status stable since progression of metastasis will decrease her performance status. I agree with liver biopsy formation and for further molecular analysis. In the

## 2017-12-10 NOTE — Progress Notes
With today's evaluation also I have requested baseline laboratory testing to include a CBC, CMP, LDH, CEA.  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 If she does have a good response she'll be a candidate for curative surgery. Also extensively discussed with him about the toxicities of chemo therapy which may include mild nausea vomiting alopecia myelosuppression, oxaliplatin-induced neuropathy, excessive sensitivity to cold temperature as well as occasional mucositis from 5-fluorouracil. Patient and her sister voiced her good understanding and are anxious to proceed with chemo therapy. I plan to see her back again in 10 days CBC CMP for follow-up  I also left the message with Dr. Robbie Lis to see whether he has sent specimen for [facility] molecular analysis if not I will send to [facility] for the same analysis  I spent more than 40 minutes including 30 minutes counseling time and answered all her questions to their satisfaction  March 07, 2017  This patient has persistent nausea vomiting and also has some obstructive symptoms from GE junction tumor, I have discussed the case with Dr. Freddrick March was suggested to have a jejunostomy tube placed for nutritional purposes. Dr. Freddrick March also talked to the patient to phone regarding the need of jejunostomy tube. Patient understand the situation and she is willing to proceed with the jejunostomy tube. Dr. Vilinda Blanks will make appropriate arrangements. I will give IV fluid today and tomorrow and advised to drink protein drinks as much she can. I also twice her that in today to have nausea vomiting she may need to go to the emergency room in May needs to be admitted to the hospital for jejunostomy tube and IV fluids. I plan to

## 2017-12-10 NOTE — Progress Notes
enhancing nodules in the liver measuring up to 1.2 cm compatible with metastatic disease which corresponds to the hypermetabolic lesions described in the PET CT scan liver metastases      Physical Exam: December 10, 2017   Physical examination  Patient is alert, not in any acute distress , tired and fatigue, able to tolerate clear liquids , ECOG performance status?0,   HEENT, no pallor nor icterus, throat clear is a petechia, mild dehydration  Neck, no lymphadenopathy, no thyromegaly, no lymphadenopathy  Respiratory system, bilateral mild wheezing no rhonchi or vascular  Cardiac, S1-S2 regular  Abdomen, soft nontender no hepatosplenomegaly, bowel sounds are active, no other mass palpable, laparotomy incision healed well,, feeding jejunostomy has been removed, mild tenderness at the site   Musculoskeletal, no joint pain, no muscle pain, mild numbness of the feet and hands which is slowly getting worse   CNS, alert oriented ?3, able to more 4 extremities, mild peripheral neuropathy  No peripheral edema both feet  Psychiatric, not agitated or anxious        Assessment:    Possible recurrence with multiple liver metastasis by CT scan, March 2019   Status post laparotomy, total gastrectomy with en bloc left lateral lobe liver resection with D2 lymph node dissection, feeding jejunostomy  August 30, 2017 , R0 resection , T3 N2 M1 local extension to the liver resected   Persistent nausea vomiting from chemo therapy with 5-FU leucovorin  ,   This is a very pleasant 62 year old Caucasian female patient with 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 The patient has a Mediport placed as of this Friday, 02/22/2017. I discussed today with her patient and her sister regarding her diagnosis, prognosis and treatments for this

## 2017-12-10 NOTE — Progress Notes
a wellness check in March 2018 and this was completely negative. The patient also has history of breast cancer in 2010 and she is status post right mastectomy with breast reconstruction. She did not receive chemotherapy but she was treated with Herceptin for 50 months by an oncologist in [place]. According to her report she could not tolerate tamoxifen. In 2016 she had an episode of vertigo and CT scan of the head showed dural thickness.    The patient has never noticed blood in the stool or in the urine, she never had the yellow jaundice, she never had hepatitis or blood transfusions. She has lost approximately 20 pounds since December 2017. She has difficulties with nutrition especially with solid food, as she has to cut the food in small pieces and chew for long time and she always gets pain postprandially.    The patient used to be a smoker for 10 years but she quit in 1989 and she smoked approximately one pack per day. She used to drink wine.    Regarding her family history her father has dementia and heart disease. Her mother had breast cancer at the age of 14 her great-grandmother also had breast cancer. There is no ovarian cancer in the family. Her father has history of prostate cancer and colon cancer. Her paternal grandmother had also colon cancer.    03/04/2017    Ms. 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 The patient started having severe acid reflux syndrome in November 2017 and she ended up having an endoscopy on 01/28/2017 with also endoscopic ultrasound on 02/08/2017. Results of the pathology by biopsy of a fungating mass were discussed as above. According to the patient's report the tumor was HER-2 negative. She was initially evaluated by medical oncologist at [facility] but

## 2017-12-10 NOTE — Progress Notes
a few times. Even though she is on emend, Compazine,  Zofran, Prevacid, . Patient also was given Carafate which she cannot tolerate. She has been miserable. She has been using the J-tube constantly. He is taking of possibly quitting chemo therapy. I told her that he should try Reglan 5 mg 4 times a day if it did not work then it may be reasonable to quit further chemotherapy. I am afraid if she continued to have too much nausea and vomiting which may interfere with her surgical incisions. 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 She also had a J-tube placed for feeding purposes as well. Patient underwent laparotomy  total gastrectomy with en bloc left lateral lobe of the liver resection with D2 lymph node dissection feeding jejunostomy in July 30, 2017. Patient recovered from the surgery and currently on postoperative chemotherapy with 5-fluorouracil and leucovorin   Patient is here for follow-up, patient stopped chemo therapy 6 weeks ago due to intolerance. Unfortunately recent PET CT scan showed multiple liver lesions consistent with metastasis. Patient is scheduled to have needle biopsy for confirmation  Patient has been feeling better last few weeks and was eating enough and decided to remove the J-tube. She does have occasional pain where the J-tube as no other abdominal pain, no diarrhea no vomiting no nausea. Appetite fair. He is keeping her weight steady.

## 2017-12-10 NOTE — Progress Notes
bloc left lateral lobe of the liver resection with D2 lymph node dissection feeding jejunostomy in July 30, 2017. Patient recovered from the surgery and currently on postoperative chemotherapy with 5-fluorouracil and leucovorin  Patient is here for a follow-up she has received the first postoperative chemotherapy with continuous infusion 5-FU and leucovorin 2 weeks ago and tolerated reasonably well although she has quite a bit nausea and taking Zofran as well as Compazine as needed. She had received them and as well as saw from before the chemo therapy but she is having unbelievable nausea. She still have J-tube in place and using for feeding purposes. She is also taking oral intake without much problem. Patient denies any fever or chills cough chest pain or shortness of breath, has fatigue but continued to work at home. 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 She also had a J-tube placed for feeding purposes as well. Patient underwent laparotomy  total gastrectomy with en bloc left lateral lobe of the liver resection with D2 lymph node dissection feeding jejunostomy in July 30, 2017. Patient recovered from the surgery and currently on postoperative chemotherapy with 5-fluorouracil and leucovorin  Patient has taken 3 course of chemotherapy with continuous infusion 5-FU leucovorin but having quite a bit nausea and 1 maturing. Last chemo therapy she said she had stomach pain and also persistent nausea vomiting

## 2017-12-10 NOTE — Progress Notes
for a total of 8 treatment before undergoing surgery since after surgery it would be difficult to provide adjuvant chemo therapy.   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 She also had a J-tube placed for feeding purposes as well.  Patient's here for a follow-up regarding her cancer as well as chemo therapy. She was seen in [facility] by surgeon as well as oncologists Dr. Benjie Karvonen who agreed with current plan of care including completion of 8 treatment with FOLFOX followed by surgery. Patient would like to have surgery done in [facility] and has been scheduled for end of October. Patient is been feeling much better now able to swallow and keeping it down continues to use J-tube for nutritional purposes. She has been feeling a bit tired and fatigued but much better than before start having some numbness and tingling in hands and feet. 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 She also had a J-tube placed for feeding purposes as well.  Patient is here for follow-up as well as for chemotherapy. She completed 6

## 2017-12-10 NOTE — Progress Notes
Constitutional:No more weight loss, mild fatigue, No fever, able to tolerate clear liquids  Eyes: No double vision  Ears,Nose, Mouth,Throat: No sinus trouble, No sore throat  Cardiac: No lightheadedness, No swelling in legs, No chest pain  Respiratory: No cough, No hemoptysis, No shortness of breath  Gastrointestinal:  No nausea, no vomiting, no  abdominal pain, jejunostomy tube been removed   Genitourinary: No burning on urination  Musculosketal: No bone pain.   Skin: No skin rash  Neurological: No seizures, No loss of balance, No weakness of limbs, mild numbness of both feet and hands, improved  Psychiatric: Deferred  Hematologic/Lymphatic/Immunologic: No bleeding, No lumps in arm pits      Medications:   Current Outpatient Prescriptions:   ?  medical marijuana, Take 600 mg by mouth., Disp: , Rfl:   ?  ALPRAZolam (XANAX) 0.5 MG PO Tablet, TK 1 T PO QHS, Disp: , Rfl: 2  ?  amoxicillin-clavulanate (AUGMENTIN) 400-57 MG PO Tablet Chewable, Chew 1 tablet 2 times daily for 1 day., Disp: 2 tablet, Rfl: 0  ?  amoxicillin-clavulanate (AUGMENTIN) 875-125 MG PO Tablet, every 12 hours., Disp: , Rfl: 0  ?  ARMOUR THYROID 15 MG PO Tablet, TK 3 TS PO ONCE D OES, Disp: , Rfl: 3  ?  CARAFATE 1 GM/10ML PO Suspension, TK 10 ML PO TID OES 1 HOUR B MEALS AND HS, Disp: 420 mL, Rfl: 2  ?  colesevelam (WELCHOL) 625 MG PO Tablet, , Disp: , Rfl: 6  ?  cyanocobalamin (vitamin B12) 1000 MCG/ML IJ Solution, INJ 1 ML  ONCE A MONTH, Disp: , Rfl: 6  ?  diclofenac (VOLTAREN GEL) 1 % TD Gel, APP AA BID UTD, Disp: , Rfl: 0  ?  DILAUDID 1 MG/ML PO Liquid, , Disp: , Rfl: 0  ?  eszopiclone (LUNESTA) 3 MG PO Tablet, , Disp: , Rfl: 2  ?  HYDROcodone-acetaminophen (NORCO) 10-325 MG PO Tablet, TK 1 T PO Q 6 H PRN, Disp: , Rfl: 0  ?  HYDROcodone-acetaminophen (NORCO) 5-325 MG PO Tablet, TK 1-2 TS PO Q 6 H, Disp: , Rfl: 0  ?  ibuprofen (ADVIL,MOTRIN) 600 MG PO Tablet, Take 1 tablet by mouth 3 times daily for 5 days., Disp: 15 tablet, Rfl: 0

## 2017-12-16 NOTE — Telephone Encounter
Good morning,    Mariah Cameron would like to know if she can start her infusion in April anstead of 12/20/2017?

## 2017-12-17 ENCOUNTER — Inpatient Hospital Stay: Attending: Hematology & Oncology | Primary: Internal Medicine

## 2017-12-17 MED ORDER — SODIUM CHLORIDE FLUSH 0.9 % IV SOLN
10 mL | Status: CN | PRN
Start: 2017-12-17 — End: ?

## 2017-12-17 MED ORDER — PACLITAXEL INFUSION BCN JX
80 mg/m2 | Freq: Once | INTRAVENOUS | Status: CN
Start: 2017-12-17 — End: ?

## 2017-12-17 MED ORDER — DEXAMETHASONE SODIUM PHOSPHATE 20 MG/5ML IJ SOLN
20 mg | INTRAVENOUS | Status: CN | PRN
Start: 2017-12-17 — End: ?

## 2017-12-17 MED ORDER — HEPARIN SODIUM LOCK FLUSH 100 UNIT/ML IV SOLN
500 [IU] | Status: CN | PRN
Start: 2017-12-17 — End: ?

## 2017-12-17 MED ORDER — PEGFILGRASTIM 6 MG/0.6ML SC PSKT
6 mg | Freq: Once | SUBCUTANEOUS | Status: CN
Start: 2017-12-17 — End: ?

## 2017-12-17 MED ORDER — MEPERIDINE HCL 25 MG/ML IJ SOLN
25 mg | INTRAVENOUS | Status: CN | PRN
Start: 2017-12-17 — End: ?

## 2017-12-17 MED ORDER — DEXAMETHASONE IVPB BCN JX
20 mg | Freq: Once | INTRAVENOUS | Status: CN
Start: 2017-12-17 — End: ?

## 2017-12-17 MED ORDER — HYDROCORTISONE NA SUCCINATE PF 100 MG IJ SOLR
100 mg | INTRAVENOUS | Status: CN | PRN
Start: 2017-12-17 — End: ?

## 2017-12-17 MED ORDER — RAMUCIRUMAB (CYRAMZA) INFUSION BCN JX
8 mg/kg | Freq: Once | INTRAVENOUS | Status: CN
Start: 2017-12-17 — End: ?

## 2017-12-17 MED ORDER — DIPHENHYDRAMINE HCL 50 MG/ML IJ SOLN
50 mg | Freq: Once | INTRAVENOUS | Status: CN
Start: 2017-12-17 — End: ?

## 2017-12-17 MED ORDER — ACETAMINOPHEN 325 MG PO TABS
650 mg | Freq: Once | ORAL | Status: CN
Start: 2017-12-17 — End: ?

## 2017-12-17 MED ORDER — DIPHENHYDRAMINE HCL 50 MG/ML IJ SOLN
25 mg | INTRAVENOUS | Status: CN | PRN
Start: 2017-12-17 — End: ?

## 2017-12-17 MED ORDER — SODIUM CHLORIDE 0.9 % IV SOLN
Freq: Once | INTRAVENOUS | Status: CN
Start: 2017-12-17 — End: ?

## 2017-12-17 MED ORDER — FAMOTIDINE IVPB JX
20 mg | Freq: Once | INTRAVENOUS | Status: CN
Start: 2017-12-17 — End: ?

## 2017-12-24 ENCOUNTER — Ambulatory Visit: Admit: 2017-12-24 | Discharge: 2017-12-24 | Attending: Hematology & Oncology | Primary: Internal Medicine

## 2017-12-24 ENCOUNTER — Inpatient Hospital Stay: Admit: 2017-12-24 | Discharge: 2017-12-25 | Primary: Internal Medicine

## 2017-12-24 DIAGNOSIS — K769 Liver disease, unspecified: Secondary | ICD-10-CM

## 2017-12-24 DIAGNOSIS — C787 Secondary malignant neoplasm of liver and intrahepatic bile duct: Secondary | ICD-10-CM

## 2017-12-24 DIAGNOSIS — Z87891 Personal history of nicotine dependence: Secondary | ICD-10-CM

## 2017-12-24 DIAGNOSIS — C169 Malignant neoplasm of stomach, unspecified: Principal | ICD-10-CM

## 2017-12-24 DIAGNOSIS — Z85028 Personal history of other malignant neoplasm of stomach: Secondary | ICD-10-CM

## 2017-12-24 DIAGNOSIS — M1990 Unspecified osteoarthritis, unspecified site: Secondary | ICD-10-CM

## 2017-12-24 DIAGNOSIS — K219 Gastro-esophageal reflux disease without esophagitis: Principal | ICD-10-CM

## 2017-12-24 DIAGNOSIS — R9389 Abnormal findings on diagnostic imaging of other specified body structures: Secondary | ICD-10-CM

## 2017-12-24 DIAGNOSIS — R1310 Dysphagia, unspecified: Secondary | ICD-10-CM

## 2017-12-24 DIAGNOSIS — E039 Hypothyroidism, unspecified: Secondary | ICD-10-CM

## 2017-12-24 DIAGNOSIS — Z9884 Bariatric surgery status: Secondary | ICD-10-CM

## 2017-12-24 DIAGNOSIS — R634 Abnormal weight loss: Secondary | ICD-10-CM

## 2017-12-24 DIAGNOSIS — D219 Benign neoplasm of connective and other soft tissue, unspecified: Secondary | ICD-10-CM

## 2017-12-24 DIAGNOSIS — Z800 Family history of malignant neoplasm of digestive organs: Secondary | ICD-10-CM

## 2017-12-24 DIAGNOSIS — C50911 Malignant neoplasm of unspecified site of right female breast: Secondary | ICD-10-CM

## 2017-12-24 DIAGNOSIS — R1013 Epigastric pain: Secondary | ICD-10-CM

## 2017-12-24 DIAGNOSIS — Z79899 Other long term (current) drug therapy: Secondary | ICD-10-CM

## 2017-12-24 DIAGNOSIS — F419 Anxiety disorder, unspecified: Secondary | ICD-10-CM

## 2017-12-24 DIAGNOSIS — M810 Age-related osteoporosis without current pathological fracture: Secondary | ICD-10-CM

## 2017-12-24 DIAGNOSIS — Z8042 Family history of malignant neoplasm of prostate: Secondary | ICD-10-CM

## 2017-12-24 DIAGNOSIS — Z9221 Personal history of antineoplastic chemotherapy: Secondary | ICD-10-CM

## 2017-12-24 DIAGNOSIS — G8929 Other chronic pain: Secondary | ICD-10-CM

## 2017-12-24 DIAGNOSIS — D501 Sideropenic dysphagia: Secondary | ICD-10-CM

## 2017-12-24 DIAGNOSIS — Z452 Encounter for adjustment and management of vascular access device: Principal | ICD-10-CM

## 2017-12-24 DIAGNOSIS — Z9011 Acquired absence of right breast and nipple: Secondary | ICD-10-CM

## 2017-12-24 DIAGNOSIS — Z803 Family history of malignant neoplasm of breast: Secondary | ICD-10-CM

## 2017-12-24 MED ORDER — HEPARIN SODIUM LOCK FLUSH 100 UNIT/ML IV SOLN
300-500 [IU] | Status: DC | PRN
Start: 2017-12-24 — End: 2017-12-25

## 2018-01-28 ENCOUNTER — Inpatient Hospital Stay: Primary: Internal Medicine

## 2018-01-30 ENCOUNTER — Ambulatory Visit: Admit: 2018-01-30 | Discharge: 2018-01-30 | Attending: Hematology & Oncology | Primary: Internal Medicine

## 2018-01-30 ENCOUNTER — Ambulatory Visit: Primary: Internal Medicine

## 2018-01-30 DIAGNOSIS — K769 Liver disease, unspecified: Secondary | ICD-10-CM

## 2018-01-30 DIAGNOSIS — M1990 Unspecified osteoarthritis, unspecified site: Secondary | ICD-10-CM

## 2018-01-30 DIAGNOSIS — M545 Low back pain: Secondary | ICD-10-CM

## 2018-01-30 DIAGNOSIS — Z934 Other artificial openings of gastrointestinal tract status: Secondary | ICD-10-CM

## 2018-01-30 DIAGNOSIS — C169 Malignant neoplasm of stomach, unspecified: Principal | ICD-10-CM

## 2018-01-30 DIAGNOSIS — Z903 Acquired absence of stomach [part of]: Secondary | ICD-10-CM

## 2018-01-30 DIAGNOSIS — F419 Anxiety disorder, unspecified: Secondary | ICD-10-CM

## 2018-01-30 DIAGNOSIS — C50911 Malignant neoplasm of unspecified site of right female breast: Secondary | ICD-10-CM

## 2018-01-30 DIAGNOSIS — Z8042 Family history of malignant neoplasm of prostate: Secondary | ICD-10-CM

## 2018-01-30 DIAGNOSIS — C787 Secondary malignant neoplasm of liver and intrahepatic bile duct: Secondary | ICD-10-CM

## 2018-01-30 DIAGNOSIS — Z79899 Other long term (current) drug therapy: Secondary | ICD-10-CM

## 2018-01-30 DIAGNOSIS — Z9882 Breast implant status: Secondary | ICD-10-CM

## 2018-01-30 DIAGNOSIS — M810 Age-related osteoporosis without current pathological fracture: Secondary | ICD-10-CM

## 2018-01-30 DIAGNOSIS — G8929 Other chronic pain: Secondary | ICD-10-CM

## 2018-01-30 DIAGNOSIS — D219 Benign neoplasm of connective and other soft tissue, unspecified: Secondary | ICD-10-CM

## 2018-01-30 DIAGNOSIS — R197 Diarrhea, unspecified: Secondary | ICD-10-CM

## 2018-01-30 DIAGNOSIS — Z853 Personal history of malignant neoplasm of breast: Secondary | ICD-10-CM

## 2018-01-30 DIAGNOSIS — R634 Abnormal weight loss: Secondary | ICD-10-CM

## 2018-01-30 DIAGNOSIS — R5383 Other fatigue: Secondary | ICD-10-CM

## 2018-01-30 DIAGNOSIS — Z90710 Acquired absence of both cervix and uterus: Secondary | ICD-10-CM

## 2018-01-30 DIAGNOSIS — Z9011 Acquired absence of right breast and nipple: Secondary | ICD-10-CM

## 2018-01-30 DIAGNOSIS — R1013 Epigastric pain: Secondary | ICD-10-CM

## 2018-01-30 DIAGNOSIS — R109 Unspecified abdominal pain: Secondary | ICD-10-CM

## 2018-01-30 DIAGNOSIS — Z85028 Personal history of other malignant neoplasm of stomach: Secondary | ICD-10-CM

## 2018-01-30 DIAGNOSIS — Z803 Family history of malignant neoplasm of breast: Secondary | ICD-10-CM

## 2018-01-30 DIAGNOSIS — E039 Hypothyroidism, unspecified: Secondary | ICD-10-CM

## 2018-01-30 DIAGNOSIS — R1310 Dysphagia, unspecified: Secondary | ICD-10-CM

## 2018-01-30 DIAGNOSIS — K219 Gastro-esophageal reflux disease without esophagitis: Principal | ICD-10-CM

## 2018-01-30 DIAGNOSIS — R630 Anorexia: Secondary | ICD-10-CM

## 2018-01-30 DIAGNOSIS — Z87891 Personal history of nicotine dependence: Secondary | ICD-10-CM

## 2018-01-30 DIAGNOSIS — Z800 Family history of malignant neoplasm of digestive organs: Secondary | ICD-10-CM

## 2018-01-30 DIAGNOSIS — R9389 Abnormal findings on diagnostic imaging of other specified body structures: Secondary | ICD-10-CM

## 2018-01-30 MED ORDER — SODIUM CHLORIDE 0.9 % IV SOLN
Freq: Once | INTRAVENOUS | Status: CN
Start: 2018-01-30 — End: ?

## 2018-01-30 MED ORDER — SODIUM CHLORIDE FLUSH 0.9 % IV SOLN
10 mL | Status: CN | PRN
Start: 2018-01-30 — End: ?

## 2018-01-30 MED ORDER — DEXAMETHASONE SODIUM PHOSPHATE 20 MG/5ML IJ SOLN
20 mg | INTRAVENOUS | Status: CN | PRN
Start: 2018-01-30 — End: ?

## 2018-01-30 MED ORDER — HYDROCORTISONE NA SUCCINATE PF 100 MG IJ SOLR
100 mg | INTRAVENOUS | Status: CN | PRN
Start: 2018-01-30 — End: ?

## 2018-01-30 MED ORDER — PEMBROLIZUMAB INFUSION JX
200 mg | Freq: Once | INTRAVENOUS | Status: CN
Start: 2018-01-30 — End: ?

## 2018-01-30 MED ORDER — ONDANSETRON HCL 8 MG PO TABS
8 mg | ORAL
Start: 2018-01-30 — End: ?

## 2018-01-30 MED ORDER — HEPARIN SODIUM LOCK FLUSH 100 UNIT/ML IV SOLN
500 [IU] | Status: CN | PRN
Start: 2018-01-30 — End: ?

## 2018-01-30 MED ORDER — DIPHENHYDRAMINE HCL 50 MG/ML IJ SOLN
25 mg | INTRAVENOUS | Status: CN | PRN
Start: 2018-01-30 — End: ?

## 2018-01-30 MED ORDER — MEPERIDINE HCL 25 MG/ML IJ SOLN
25 mg | INTRAVENOUS | Status: CN | PRN
Start: 2018-01-30 — End: ?

## 2018-01-30 MED ORDER — ACETAMINOPHEN 325 MG PO TABS
650 mg | Freq: Once | ORAL | Status: CN
Start: 2018-01-30 — End: ?

## 2018-01-30 MED ORDER — DIPHENHYDRAMINE IVPB JX
50 mg | Freq: Once | INTRAVENOUS | Status: CN
Start: 2018-01-30 — End: ?

## 2018-01-30 MED ORDER — MEDICAL MARIJUANA
600 mg | ORAL
Start: 2018-01-30 — End: ?

## 2018-01-31 ENCOUNTER — Inpatient Hospital Stay: Admit: 2018-01-31 | Primary: Internal Medicine

## 2018-02-03 ENCOUNTER — Ambulatory Visit: Primary: Internal Medicine

## 2018-02-03 ENCOUNTER — Inpatient Hospital Stay: Admit: 2018-02-03 | Primary: Internal Medicine

## 2018-02-04 ENCOUNTER — Inpatient Hospital Stay: Attending: Hematology & Oncology | Primary: Internal Medicine

## 2018-02-05 ENCOUNTER — Inpatient Hospital Stay: Admit: 2018-02-05 | Discharge: 2018-02-06 | Primary: Internal Medicine

## 2018-02-05 DIAGNOSIS — Z853 Personal history of malignant neoplasm of breast: Secondary | ICD-10-CM

## 2018-02-05 DIAGNOSIS — K769 Liver disease, unspecified: Secondary | ICD-10-CM

## 2018-02-05 DIAGNOSIS — C169 Malignant neoplasm of stomach, unspecified: Secondary | ICD-10-CM

## 2018-02-05 DIAGNOSIS — C160 Malignant neoplasm of cardia: Secondary | ICD-10-CM

## 2018-02-05 DIAGNOSIS — Z86000 Personal history of in-situ neoplasm of breast: Secondary | ICD-10-CM

## 2018-02-05 DIAGNOSIS — Z5112 Encounter for antineoplastic immunotherapy: Principal | ICD-10-CM

## 2018-02-05 DIAGNOSIS — D501 Sideropenic dysphagia: Secondary | ICD-10-CM

## 2018-02-05 DIAGNOSIS — C787 Secondary malignant neoplasm of liver and intrahepatic bile duct: Secondary | ICD-10-CM

## 2018-02-05 DIAGNOSIS — R9389 Abnormal findings on diagnostic imaging of other specified body structures: Principal | ICD-10-CM

## 2018-02-05 MED ORDER — HEPARIN SODIUM LOCK FLUSH 100 UNIT/ML IV SOLN
500 [IU] | Status: DC | PRN
Start: 2018-02-05 — End: 2018-02-06

## 2018-02-05 MED ORDER — PEMBROLIZUMAB INFUSION JX
200 mg | Freq: Once | INTRAVENOUS | Status: DC
Start: 2018-02-05 — End: 2018-02-06

## 2018-02-26 ENCOUNTER — Inpatient Hospital Stay: Admit: 2018-02-26 | Discharge: 2018-02-27 | Primary: Internal Medicine

## 2018-02-26 DIAGNOSIS — C160 Malignant neoplasm of cardia: Secondary | ICD-10-CM

## 2018-02-26 DIAGNOSIS — Z5112 Encounter for antineoplastic immunotherapy: Principal | ICD-10-CM

## 2018-02-26 DIAGNOSIS — C169 Malignant neoplasm of stomach, unspecified: Secondary | ICD-10-CM

## 2018-02-26 DIAGNOSIS — C787 Secondary malignant neoplasm of liver and intrahepatic bile duct: Secondary | ICD-10-CM

## 2018-02-26 DIAGNOSIS — K769 Liver disease, unspecified: Secondary | ICD-10-CM

## 2018-02-26 MED ORDER — SODIUM CHLORIDE 0.9 % IV SOLN
Freq: Once | INTRAVENOUS | Status: DC
Start: 2018-02-26 — End: 2018-02-27

## 2018-02-26 MED ORDER — PEMBROLIZUMAB INFUSION JX
200 mg | Freq: Once | INTRAVENOUS | Status: DC
Start: 2018-02-26 — End: 2018-02-27

## 2018-02-26 MED ORDER — HEPARIN SODIUM LOCK FLUSH 100 UNIT/ML IV SOLN
500 [IU] | Status: DC | PRN
Start: 2018-02-26 — End: 2018-02-27

## 2018-03-01 DEATH — deceased

## 2018-03-04 ENCOUNTER — Ambulatory Visit: Admit: 2018-03-04 | Discharge: 2018-03-04 | Attending: Hematology & Oncology | Primary: Internal Medicine

## 2018-03-04 DIAGNOSIS — Z903 Acquired absence of stomach [part of]: Secondary | ICD-10-CM

## 2018-03-04 DIAGNOSIS — Z800 Family history of malignant neoplasm of digestive organs: Secondary | ICD-10-CM

## 2018-03-04 DIAGNOSIS — M1990 Unspecified osteoarthritis, unspecified site: Secondary | ICD-10-CM

## 2018-03-04 DIAGNOSIS — K769 Liver disease, unspecified: Secondary | ICD-10-CM

## 2018-03-04 DIAGNOSIS — R9389 Abnormal findings on diagnostic imaging of other specified body structures: Secondary | ICD-10-CM

## 2018-03-04 DIAGNOSIS — R109 Unspecified abdominal pain: Secondary | ICD-10-CM

## 2018-03-04 DIAGNOSIS — C50911 Malignant neoplasm of unspecified site of right female breast: Secondary | ICD-10-CM

## 2018-03-04 DIAGNOSIS — C787 Secondary malignant neoplasm of liver and intrahepatic bile duct: Secondary | ICD-10-CM

## 2018-03-04 DIAGNOSIS — Z90710 Acquired absence of both cervix and uterus: Secondary | ICD-10-CM

## 2018-03-04 DIAGNOSIS — F419 Anxiety disorder, unspecified: Secondary | ICD-10-CM

## 2018-03-04 DIAGNOSIS — Z853 Personal history of malignant neoplasm of breast: Secondary | ICD-10-CM

## 2018-03-04 DIAGNOSIS — Z9011 Acquired absence of right breast and nipple: Secondary | ICD-10-CM

## 2018-03-04 DIAGNOSIS — E039 Hypothyroidism, unspecified: Secondary | ICD-10-CM

## 2018-03-04 DIAGNOSIS — R1310 Dysphagia, unspecified: Secondary | ICD-10-CM

## 2018-03-04 DIAGNOSIS — Z934 Other artificial openings of gastrointestinal tract status: Secondary | ICD-10-CM

## 2018-03-04 DIAGNOSIS — C169 Malignant neoplasm of stomach, unspecified: Principal | ICD-10-CM

## 2018-03-04 DIAGNOSIS — K219 Gastro-esophageal reflux disease without esophagitis: Principal | ICD-10-CM

## 2018-03-04 DIAGNOSIS — R634 Abnormal weight loss: Secondary | ICD-10-CM

## 2018-03-04 DIAGNOSIS — Z8042 Family history of malignant neoplasm of prostate: Secondary | ICD-10-CM

## 2018-03-04 DIAGNOSIS — R1013 Epigastric pain: Secondary | ICD-10-CM

## 2018-03-04 DIAGNOSIS — R5383 Other fatigue: Secondary | ICD-10-CM

## 2018-03-04 DIAGNOSIS — Z79899 Other long term (current) drug therapy: Secondary | ICD-10-CM

## 2018-03-04 DIAGNOSIS — D219 Benign neoplasm of connective and other soft tissue, unspecified: Secondary | ICD-10-CM

## 2018-03-04 DIAGNOSIS — C160 Malignant neoplasm of cardia: Secondary | ICD-10-CM

## 2018-03-04 MED ORDER — HEPARIN SODIUM LOCK FLUSH 100 UNIT/ML IV SOLN
500 [IU] | PRN
Start: 2018-03-04 — End: ?

## 2018-03-04 MED ORDER — SODIUM CHLORIDE 0.9 % IV SOLN
Freq: Once | INTRAVENOUS
Start: 2018-03-04 — End: ?

## 2018-03-04 MED ORDER — DIPHENHYDRAMINE HCL 50 MG/ML IJ SOLN
25 mg | INTRAVENOUS | PRN
Start: 2018-03-04 — End: ?

## 2018-03-04 MED ORDER — SODIUM CHLORIDE FLUSH 0.9 % IV SOLN
10 mL | PRN
Start: 2018-03-04 — End: ?

## 2018-03-04 MED ORDER — PEMBROLIZUMAB INFUSION JX
200 mg | Freq: Once | INTRAVENOUS
Start: 2018-03-04 — End: ?

## 2018-03-04 MED ORDER — MEPERIDINE HCL 25 MG/ML IJ SOLN
25 mg | INTRAVENOUS | PRN
Start: 2018-03-04 — End: ?

## 2018-03-04 MED ORDER — DEXAMETHASONE SODIUM PHOSPHATE 20 MG/5ML IJ SOLN
20 mg | INTRAVENOUS | PRN
Start: 2018-03-04 — End: ?

## 2018-03-04 MED ORDER — HYDROCORTISONE NA SUCCINATE PF 100 MG IJ SOLR
100 mg | INTRAVENOUS | PRN
Start: 2018-03-04 — End: ?

## 2018-03-19 ENCOUNTER — Inpatient Hospital Stay: Primary: Internal Medicine

## 2018-03-20 DIAGNOSIS — C169 Malignant neoplasm of stomach, unspecified: Secondary | ICD-10-CM

## 2018-03-20 DIAGNOSIS — K769 Liver disease, unspecified: Principal | ICD-10-CM

## 2018-03-21 ENCOUNTER — Inpatient Hospital Stay: Primary: Internal Medicine

## 2018-03-24 ENCOUNTER — Ambulatory Visit: Admit: 2018-03-24 | Discharge: 2018-03-25 | Attending: Hematology & Oncology | Primary: Internal Medicine

## 2018-03-24 ENCOUNTER — Inpatient Hospital Stay: Admit: 2018-03-24 | Discharge: 2018-03-25 | Primary: Internal Medicine

## 2018-03-24 DIAGNOSIS — G8929 Other chronic pain: Secondary | ICD-10-CM

## 2018-03-24 DIAGNOSIS — K219 Gastro-esophageal reflux disease without esophagitis: Principal | ICD-10-CM

## 2018-03-24 DIAGNOSIS — K769 Liver disease, unspecified: Secondary | ICD-10-CM

## 2018-03-24 DIAGNOSIS — C169 Malignant neoplasm of stomach, unspecified: Secondary | ICD-10-CM

## 2018-03-24 DIAGNOSIS — R1310 Dysphagia, unspecified: Secondary | ICD-10-CM

## 2018-03-24 DIAGNOSIS — E039 Hypothyroidism, unspecified: Secondary | ICD-10-CM

## 2018-03-24 DIAGNOSIS — Z9884 Bariatric surgery status: Secondary | ICD-10-CM

## 2018-03-24 DIAGNOSIS — M810 Age-related osteoporosis without current pathological fracture: Secondary | ICD-10-CM

## 2018-03-24 DIAGNOSIS — C787 Secondary malignant neoplasm of liver and intrahepatic bile duct: Secondary | ICD-10-CM

## 2018-03-24 DIAGNOSIS — Z803 Family history of malignant neoplasm of breast: Secondary | ICD-10-CM

## 2018-03-24 DIAGNOSIS — R1013 Epigastric pain: Secondary | ICD-10-CM

## 2018-03-24 DIAGNOSIS — Z87891 Personal history of nicotine dependence: Secondary | ICD-10-CM

## 2018-03-24 DIAGNOSIS — D219 Benign neoplasm of connective and other soft tissue, unspecified: Secondary | ICD-10-CM

## 2018-03-24 DIAGNOSIS — C160 Malignant neoplasm of cardia: Secondary | ICD-10-CM

## 2018-03-24 DIAGNOSIS — R634 Abnormal weight loss: Secondary | ICD-10-CM

## 2018-03-24 DIAGNOSIS — R9389 Abnormal findings on diagnostic imaging of other specified body structures: Secondary | ICD-10-CM

## 2018-03-24 DIAGNOSIS — Z79899 Other long term (current) drug therapy: Secondary | ICD-10-CM

## 2018-03-24 DIAGNOSIS — Z8042 Family history of malignant neoplasm of prostate: Secondary | ICD-10-CM

## 2018-03-24 DIAGNOSIS — F419 Anxiety disorder, unspecified: Secondary | ICD-10-CM

## 2018-03-24 DIAGNOSIS — M1990 Unspecified osteoarthritis, unspecified site: Secondary | ICD-10-CM

## 2018-03-24 DIAGNOSIS — C50911 Malignant neoplasm of unspecified site of right female breast: Secondary | ICD-10-CM

## 2018-03-24 MED ORDER — SODIUM CHLORIDE FLUSH 0.9 % IV SOLN
10 mL | Status: CN | PRN
Start: 2018-03-24 — End: ?

## 2018-03-24 MED ORDER — ONDANSETRON IVPB BCN JX
8 mg | Freq: Once | INTRAVENOUS | Status: CP
Start: 2018-03-24 — End: ?

## 2018-03-24 MED ORDER — HEPARIN SODIUM LOCK FLUSH 100 UNIT/ML IV SOLN
500 [IU] | Status: CN | PRN
Start: 2018-03-24 — End: ?

## 2018-03-24 MED ORDER — DEXAMETHASONE IVPB BCN JX
4 mg | Freq: Once | INTRAVENOUS | Status: CN
Start: 2018-03-24 — End: ?

## 2018-03-24 MED ORDER — HEPARIN SODIUM LOCK FLUSH 100 UNIT/ML IV SOLN
500 [IU] | Status: DC | PRN
Start: 2018-03-24 — End: 2018-03-25

## 2018-03-24 MED ORDER — PALONOSETRON HCL 0.25 MG/5ML IV SOLN
.25 mg | Freq: Once | INTRAVENOUS | Status: CN
Start: 2018-03-24 — End: ?

## 2018-03-24 MED ORDER — SODIUM CHLORIDE 0.9 % IV SOLN
Freq: Once | INTRAVENOUS | Status: CN
Start: 2018-03-24 — End: ?

## 2018-03-24 MED ORDER — DIPHENHYDRAMINE IVPB JX
25 mg | Freq: Once | INTRAVENOUS | Status: CN
Start: 2018-03-24 — End: ?

## 2018-03-24 MED ORDER — LORAZEPAM 2 MG/ML IJ SOLN
.5 mg | Freq: Once | INTRAVENOUS | Status: CN
Start: 2018-03-24 — End: ?

## 2018-03-24 MED ORDER — PROMETHAZINE IN NS IVPB JX
12.5 mg | Freq: Once | INTRAVENOUS | Status: CN
Start: 2018-03-24 — End: ?

## 2018-03-24 MED ORDER — FAMOTIDINE IVPB JX
20 mg | Freq: Once | INTRAVENOUS | Status: CN
Start: 2018-03-24 — End: ?

## 2018-03-24 MED ORDER — ONDANSETRON IVPB BCN JX
8 mg | Freq: Once | INTRAVENOUS | Status: CN
Start: 2018-03-24 — End: ?

## 2018-03-24 MED ORDER — SODIUM CHLORIDE 0.9 % IV SOLN
Freq: Once | INTRAVENOUS | Status: CP
Start: 2018-03-24 — End: ?

## 2018-03-27 ENCOUNTER — Inpatient Hospital Stay: Primary: Internal Medicine

## 2018-04-02 ENCOUNTER — Inpatient Hospital Stay: Primary: Internal Medicine

## 2018-04-14 ENCOUNTER — Inpatient Hospital Stay: Attending: Hematology & Oncology | Primary: Internal Medicine

## 2018-05-01 DEATH — deceased
